# Patient Record
Sex: Female | Born: 1945 | Race: White | Hispanic: No | Smoking: Never smoker
Health system: Southern US, Community
[De-identification: ages and names within clinical notes are randomized; demographics above are authoritative.]

## PROBLEM LIST (undated history)

## (undated) DIAGNOSIS — T7840XA Allergy, unspecified, initial encounter: Secondary | ICD-10-CM

## (undated) DIAGNOSIS — L109 Pemphigus, unspecified: Secondary | ICD-10-CM

## (undated) DIAGNOSIS — K5792 Diverticulitis of intestine, part unspecified, without perforation or abscess without bleeding: Secondary | ICD-10-CM

## (undated) DIAGNOSIS — M1990 Unspecified osteoarthritis, unspecified site: Secondary | ICD-10-CM

## (undated) DIAGNOSIS — E079 Disorder of thyroid, unspecified: Secondary | ICD-10-CM

## (undated) DIAGNOSIS — I82409 Acute embolism and thrombosis of unspecified deep veins of unspecified lower extremity: Secondary | ICD-10-CM

## (undated) DIAGNOSIS — I2699 Other pulmonary embolism without acute cor pulmonale: Secondary | ICD-10-CM

## (undated) DIAGNOSIS — M797 Fibromyalgia: Secondary | ICD-10-CM

## (undated) DIAGNOSIS — M109 Gout, unspecified: Secondary | ICD-10-CM

## (undated) DIAGNOSIS — M722 Plantar fascial fibromatosis: Secondary | ICD-10-CM

## (undated) DIAGNOSIS — D689 Coagulation defect, unspecified: Secondary | ICD-10-CM

## (undated) DIAGNOSIS — F419 Anxiety disorder, unspecified: Secondary | ICD-10-CM

## (undated) HISTORY — DX: Fibromyalgia: M79.7

## (undated) HISTORY — PX: OTHER SURGICAL HISTORY: SHX169

## (undated) HISTORY — PX: EYE SURGERY: SHX253

## (undated) HISTORY — DX: Allergy, unspecified, initial encounter: T78.40XA

## (undated) HISTORY — DX: Coagulation defect, unspecified: D68.9

## (undated) HISTORY — PX: ABDOMINAL HYSTERECTOMY: SHX81

## (undated) HISTORY — DX: Gout, unspecified: M10.9

## (undated) HISTORY — DX: Plantar fascial fibromatosis: M72.2

## (undated) HISTORY — PX: KNEE ARTHROSCOPY WITH LATERAL MENISECTOMY: SHX6193

## (undated) HISTORY — DX: Unspecified osteoarthritis, unspecified site: M19.90

## (undated) HISTORY — DX: Pemphigus, unspecified: L10.9

## (undated) HISTORY — PX: CERVICAL FUSION: SHX112

## (undated) HISTORY — DX: Anxiety disorder, unspecified: F41.9

---

## 1997-09-18 ENCOUNTER — Ambulatory Visit (HOSPITAL_COMMUNITY): Admission: RE | Admit: 1997-09-18 | Discharge: 1997-09-18 | Payer: Self-pay | Admitting: Obstetrics and Gynecology

## 1998-07-27 ENCOUNTER — Other Ambulatory Visit: Admission: RE | Admit: 1998-07-27 | Discharge: 1998-07-27 | Payer: Self-pay | Admitting: Obstetrics and Gynecology

## 1999-09-21 ENCOUNTER — Other Ambulatory Visit: Admission: RE | Admit: 1999-09-21 | Discharge: 1999-09-21 | Payer: Self-pay | Admitting: *Deleted

## 2002-05-09 ENCOUNTER — Emergency Department (HOSPITAL_COMMUNITY): Admission: EM | Admit: 2002-05-09 | Discharge: 2002-05-09 | Payer: Self-pay | Admitting: Emergency Medicine

## 2002-06-06 ENCOUNTER — Ambulatory Visit (HOSPITAL_COMMUNITY): Admission: RE | Admit: 2002-06-06 | Discharge: 2002-06-06 | Payer: Self-pay | Admitting: Neurology

## 2002-06-06 ENCOUNTER — Encounter: Payer: Self-pay | Admitting: Neurology

## 2002-08-22 ENCOUNTER — Encounter: Payer: Self-pay | Admitting: Neurosurgery

## 2002-08-26 ENCOUNTER — Encounter: Payer: Self-pay | Admitting: Neurosurgery

## 2002-08-26 ENCOUNTER — Inpatient Hospital Stay (HOSPITAL_COMMUNITY): Admission: RE | Admit: 2002-08-26 | Discharge: 2002-08-28 | Payer: Self-pay | Admitting: Neurosurgery

## 2005-08-22 ENCOUNTER — Encounter: Admission: RE | Admit: 2005-08-22 | Discharge: 2005-08-22 | Payer: Self-pay | Admitting: Family Medicine

## 2006-05-26 ENCOUNTER — Encounter: Admission: RE | Admit: 2006-05-26 | Discharge: 2006-05-26 | Payer: Self-pay | Admitting: Family Medicine

## 2006-06-19 ENCOUNTER — Ambulatory Visit: Payer: Self-pay | Admitting: Cardiology

## 2006-06-22 ENCOUNTER — Ambulatory Visit (HOSPITAL_COMMUNITY): Admission: RE | Admit: 2006-06-22 | Discharge: 2006-06-22 | Payer: Self-pay | Admitting: Cardiology

## 2006-06-22 ENCOUNTER — Ambulatory Visit: Payer: Self-pay | Admitting: Cardiovascular Disease

## 2006-06-29 ENCOUNTER — Ambulatory Visit: Payer: Self-pay | Admitting: Gastroenterology

## 2006-11-01 ENCOUNTER — Ambulatory Visit: Payer: Self-pay | Admitting: Internal Medicine

## 2006-11-01 LAB — CONVERTED CEMR LAB: Albumin: 3.7 g/dL (ref 3.5–5.2)

## 2006-11-08 ENCOUNTER — Encounter: Payer: Self-pay | Admitting: Internal Medicine

## 2006-11-08 ENCOUNTER — Ambulatory Visit: Payer: Self-pay | Admitting: Internal Medicine

## 2007-03-21 DIAGNOSIS — M479 Spondylosis, unspecified: Secondary | ICD-10-CM | POA: Insufficient documentation

## 2007-03-21 DIAGNOSIS — K449 Diaphragmatic hernia without obstruction or gangrene: Secondary | ICD-10-CM | POA: Insufficient documentation

## 2007-03-21 DIAGNOSIS — K219 Gastro-esophageal reflux disease without esophagitis: Secondary | ICD-10-CM | POA: Insufficient documentation

## 2007-03-21 DIAGNOSIS — M5030 Other cervical disc degeneration, unspecified cervical region: Secondary | ICD-10-CM | POA: Insufficient documentation

## 2007-03-21 DIAGNOSIS — M797 Fibromyalgia: Secondary | ICD-10-CM | POA: Insufficient documentation

## 2007-03-21 DIAGNOSIS — K319 Disease of stomach and duodenum, unspecified: Secondary | ICD-10-CM | POA: Insufficient documentation

## 2007-03-21 DIAGNOSIS — J329 Chronic sinusitis, unspecified: Secondary | ICD-10-CM | POA: Insufficient documentation

## 2007-03-21 DIAGNOSIS — M129 Arthropathy, unspecified: Secondary | ICD-10-CM | POA: Insufficient documentation

## 2007-03-21 DIAGNOSIS — K589 Irritable bowel syndrome without diarrhea: Secondary | ICD-10-CM | POA: Insufficient documentation

## 2010-01-20 ENCOUNTER — Telehealth: Payer: Self-pay | Admitting: Internal Medicine

## 2010-01-21 ENCOUNTER — Ambulatory Visit: Admit: 2010-01-21 | Payer: Self-pay | Admitting: Internal Medicine

## 2010-02-07 ENCOUNTER — Telehealth: Payer: Self-pay | Admitting: Internal Medicine

## 2010-02-07 ENCOUNTER — Encounter (INDEPENDENT_AMBULATORY_CARE_PROVIDER_SITE_OTHER): Payer: Self-pay | Admitting: *Deleted

## 2010-02-17 ENCOUNTER — Ambulatory Visit: Payer: Self-pay | Admitting: Internal Medicine

## 2010-02-17 NOTE — Progress Notes (Signed)
Summary: Severe Regurgitation   Phone Note Call from Patient Call back at Home Phone [phone]   Call For: Dr Leone Payor Reason for Call: Talk to Nurse Summary of Call: Is regurgitating every single time she eats. Thinks she sees even small pieces of skin in her vomit. Would like to be seen asap. Initial call taken by: Leanor Kail [facility],  January 20, 2010 2:17 PM  Follow-up for Phone Call        Started Monday night with  severe reflux within an hr. of eating.Dexilant isn't helping.Given appt. for tomorrow with N.P. Follow-up by: Teryl Lucy RN,  January 20, 2010 2:45 PM

## 2010-02-17 NOTE — Letter (Signed)
Summary: New Patient letter  [facility]  [address]   [place], [place] [address]   Phone: [phone]  Fax: [phone]       02/07/2010 MRN: [id number]  Sherry Levine [address] [place], [place]  [address]  Dear Ms. Chestine Spore,  Welcome to the Gastroenterology Division at [facility].    You are scheduled to see Dr.  Leone Payor on 03/21/2010 at 2:45 on the 3rd floor at [facility], [address]  We ask that you try to arrive at our office 15 minutes prior to your appointment time to allow for check-in.  We would like you to complete the enclosed self-administered evaluation form prior to your visit and bring it with you on the day of your appointment.  We will review it with you.  Also, please bring a complete list of all your medications or, if you prefer, bring the medication bottles and we will list them.  Please bring your insurance card so that we may make a copy of it.  If your insurance requires a referral to see a specialist, please bring your referral form from your primary care physician.  Co-payments are due at the time of your visit and may be paid by cash, check or credit card.     Your office visit will consist of a consult with your physician (includes a physical exam), any laboratory testing he/she may order, scheduling of any necessary diagnostic testing (e.g. x-ray, ultrasound, CT-scan), and scheduling of a procedure (e.g. Endoscopy, Colonoscopy) if required.  Please allow enough time on your schedule to allow for any/all of these possibilities.    If you cannot keep your appointment, please call [phone] to cancel or reschedule prior to your appointment date.  This allows [place] the opportunity to schedule an appointment for another patient in need of care.  If you do not cancel or reschedule by 5 p.m. the business day prior to your appointment date, you will be charged a $50.00 late cancellation/no-show fee.    Thank you for choosing [facility] for your medical needs.  We appreciate the opportunity to care for you.  Please visit [place] at our website  to learn more about our practice.                     Sincerely,                                                             The Gastroenterology Division

## 2010-02-17 NOTE — Progress Notes (Signed)
Summary: triage   Phone Note Call from Patient Call back at Home Phone [phone]   Caller: Patient Call For: Dr. Leone Payor Reason for Call: Talk to Nurse Summary of Call: Pt is having reflux and she is coughing up "yellow mucus", wants to know if there is anything she can take other than dexillant Initial call taken by: [place] Johnson,  February 07, 2010 10:37 AM  Follow-up for Phone Call        patient is advised to continue dexilant.  She has not been seen here in over 3 years.  She is scheduled for a NP3 for 02/17/10 Follow-up by: Darcey Nora RN, CGRN,  February 07, 2010 3:25 PM

## 2010-03-21 ENCOUNTER — Ambulatory Visit: Payer: Self-pay | Admitting: Internal Medicine

## 2010-05-31 NOTE — Assessment & Plan Note (Signed)
[facility]                         GASTROENTEROLOGY OFFICE NOTE   ANNAMAY, LAYMON                        MRN:          [id number]  DATE:11/01/2006                            DOB:          1945-07-11    REQUESTING PHYSICIAN:  Lindaann Pascal, PA-C, [facility].   REASON FOR CONSULTATION:  Acid reflux, bloating, constipation.   ASSESSMENT:  A 65 year old white woman previously followed years ago by  Dr. Victorino Dike.  Her current problems are:  1. Gastroesophageal reflux disease with nocturnal or early morning      regurgitation several times a week.  Previous history of peptic      stricture but not a problem now.  She is on chronic NSAIDs,      (Arthrotec), from which she is weaning.  2. Irritable bowel syndrome with bloating and gas and constipation,      problems which are chronic, taking Dulcolax 5 tablets every other      day to move her bowels.  MiraLax did not seem to help.  3. She is due for colon cancer screening, having had her last      colonoscopy, if any, years ago.   RECOMMENDATIONS/PLAN:  1. Schedule EGD to investigate chronic reflux and regurgitation.  2. Consider metoclopramide at bedtime, depending what is seen.  She      has a history of a hiatal hernia.  She could need an upper GI      series.  3. Schedule screening colonoscopy.  4. Treat irritable bowel syndrome with possible probiotics, possible      Amitiza, possible antibiotics, possible small bowel bacterial      overgrowth.  Will hold this and metoclopramide therapy until we      perform her studies.  5. The patient also has mild elevation of transaminases and ultrasound      demonstrating no problems with her gallbladder (307).  It did not      demonstrate fatty liver, although with her situation, I think that      is possible.  It also could be due to certain medications.  I do      not think there is any serious liver disease, based upon what  I      see. There are certainly no stigmata of chronic liver disease.  She      is not a drinker.  Further plans pending.  Consider celiac testing,      given the irritable bowel and the abnormal LFTs.   HISTORY:  See what I have written above as well as my office history  form.   PAST MEDICAL HISTORY/PROBLEMS:  1. Fibromyalgia.  2. Degenerative disk disease in the spine.  3. Cervical fusion and titanium plate, 1324.  She has had some      dysphagia since that time, although it is mainly pills, no food.  4. Prior hemorrhoidectomy.  5. Prior hysterectomy.  6. Allergies and sinus problems.  7. Overweight.  8. Prior menopause.  9. Chest pain, evaluated by Dr. Antoine Poche with negative cardiac CT.  10.Tertiary contractions consistent  with spasm in the esophagus on      barium swallow in 1997.  She also had a hiatal hernia and lodging      of a tablet at the aortic arch at that time.  11.Calcium score of 0 on cardiac CT, June 22, 2006.  12.Small cyst in the liver noted on abdominal ultrasound, 06/08 (she      has had two ultrasounds, one in [place] and one here in [place]).  13.Allergies to CELEBREX, CODEINE, AND PENICILLIN.  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   MEDICATIONS:  See medical history form, but these include Arthritec,  tramadol, APAP, Cymbalta, Flexeril, trazodone, Nexium, fexofenadine,  Flonase, BiDil, Dulcolax 5 every other day.   FAMILY HISTORY/SOCIAL HISTORY:  She had colon cancer in an uncle, colon  polyps in an aunt.  Parents had heart disease.  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  Close to  retired.  Two sons.  Lives with her husband.  He is already retired.   REVIEW OF SYSTEMS:  See my medical history form for full details.   PHYSICAL EXAMINATION:  Height 5 feet 3.  Weight 168 pounds.  Blood  pressure 112/78, pulse 60.  HEENT:  Eyes are anicteric.  Normal mouth, lips, and pharynx.  NECK:  Supple.  No thyromegaly  appreciated.  CHEST:  Clear.  HEART:  S1 and S2.  No murmurs, rubs or gallops.  ABDOMEN:  Soft and nontender without organomegaly or mass.  SKIN:  No acute rash.  There is no stigmata of chronic liver disease.  PSYCH:  She is alert and oriented x3.  EXTREMITIES:  Free of edema.  LYMPHATIC:  No neck or supraclavicular nodes.   I have reviewed the office notes.  The laboratory studies, x-ray studies  provided by Lindaann Pascal, PA-C.  She had a normal CBC in April.  I have  reviewed Dr. Jenene Slicker notes.   NOTE:  She has had a vitamin D deficiency adequately treated by a  rheumatologist at [facility].  Her vitamin D level is now up to 50, and she  said she felt better at that.  Her AST was normal at 40.  Her ALT was  51.  GGT 82, cholesterol 212 in April.     Iva Boop, MD,FACG  Electronically Signed   CEG/MedQ  DD: 11/01/2006  DT: 11/02/2006  Job #: [id number]   cc:   Lindaann Pascal, PA-C

## 2010-05-31 NOTE — Assessment & Plan Note (Signed)
[facility]                            CARDIOLOGY OFFICE NOTE   SHARLISA, HOLLIFIELD                        MRN:          [id number]  DATE:06/19/2006                            DOB:          Jun 22, 1945    PRIMARY:  Lindaann Pascal, PA, [facility].   REASON FOR CONSULTATION:  Evaluate patient with chest discomfort and  significant cardiovascular risk factors.   HISTORY OF PRESENT ILLNESS:  The patient is a 65 year old without prior  cardiac history.  She has a very strong family history of coronary  disease.  She has had chest discomfort; however, she also has  fibromyalgia and has difficulty sorting this out.  The discomfort occurs  sporadically.  It happens at rest and with activity.  It has been slowly  progressive over time.  She has some diffuse discomfort that may be  around her back and around her chest.  Again, she cannot differentiate  this from fibromyalgia versus other sources.  She does get short of  breath with activity, but she also reports that she has not been active  because of her fibromyalgia.  She does not have any resting shortness of  breath, does not have any PND or orthopnea.  When she gets this  discomfort, it is moderate to severe in intensity.  It may last minutes  to hours.  It is not associated with nausea, vomiting, or diaphoresis.  She has not had any palpitations, no presyncope or syncope.  She does  have fatigue.  This has also been slowly progressive.  The patient has  not had any cardiovascular testing previously.   PAST MEDICAL HISTORY:  1. Sleep apnea, mild (she currently is not wearing CPAP).  2. Fibromyalgia.   PAST SURGICAL HISTORY:  1. Surgical fusion.  2. Knee arthroscopy.  3. Hysterectomy.  4. Hemorrhoidectomy.   ALLERGIES:  1. CELEBREX.  2. CODEINE.  3. PENICILLIN.   MEDICATIONS:  1. Cymbalta 120 mg daily.  2. Ultram 100 mg daily.  3. Allegra.  4. Nasacort.  5. Arthrotec.  6. Flexeril 10 mg daily.  7. Vivelle patch.   SOCIAL HISTORY:  The patient is an Environmental health practitioner at [employer].  She is married.  She has 2 children and 3 grandchildren.  She has never  smoked cigarettes and does not drink alcohol.   FAMILY HISTORY:  Contributory for a father having heart disease in his  85s and dying of myocardial infarction at age 60.  Her mother died of a  myocardial infarction at age 51.  She has no brothers and sisters.   REVIEW OF SYSTEMS:  As stated in the HPI.  Positive for reflux, hiatal  hernia, seasonal allergies, constipation, weight gain, negative for  other systems.   PHYSICAL EXAMINATION:  GENERAL:  The patient is in no distress with  blood pressure 130/90, heart rate 80 and regular, weight 163 pounds.  Body mass index 29.  HEENT:  Eyelids unremarkable, pupils equal, round, and reactive to  light.  Fundi within normal limits.  Oral mucosa unremarkable.  NECK:  No jugular venous  distention.  Wave form within normal limits.  Carotid upstroke brisk and symmetric, no bruits, thyromegaly.  LYMPHATICS:  No cervical, axillary, inguinal adenopathy.  LUNGS:  Clear to auscultation bilaterally.  BACK:  No costovertebral angle tenderness.  CHEST:  Unremarkable.  HEART:  PMI nondisplaced or sustained.  S1 and S1 within normal limits,  no S3, no S4.  No clicks, rubs, murmurs.  ABDOMEN:  Flat, positive bowel sounds, normal in frequency and pitch.  No bruits, rebound, guarding, no midline pulsatile mass, no  hepatomegaly, no splenomegaly.  SKIN:  No rashes.  EXTREMITIES:  2+ pulses right, no edema, no cyanosis, clubbing.  NEUROLOGIC:  Oriented to person, place, or time.  Cranial nerves 2-12  grossly intact.  Motor grossly intact.   EKG:  Sinus rhythm, rate 80, axis leftward, intervals within normal  limits, no congestive ST-T wave changes.   ASSESSMENT/PLAN:  1. Chest:  The patient's chest discomfort is somewhat atypical.  I      think the possibility of  obstructive coronary disease is moderate.      At this point, I think the most helpful test would be a cardiac CT      or calcium score.  This will allow [place] to identify potential plaque      burden, decide on further testing such as stress testing and also      the level of aggressiveness for risk reduction.  I have discussed      this with the patient.  2. Risk reduction.  Given her LDL of 131 and her family history, I do      think it is prudent to treat her with statin.  She is going to get      Lipitor 20 mg daily.  She understands the thought      process in this and this is outside the recommended guidelines.  3. Followup.  I will see her back based on the results of the above.     Rollene Rotunda, MD, [facility]  Electronically Signed    JH/MedQ  DD: 06/19/2006  DT: 06/19/2006  Job #: [id number]   cc:   Lindaann Pascal, PA

## 2010-06-03 NOTE — Op Note (Signed)
   NAME:  PANTERA, WINTERROWD                           ACCOUNT NO.:  [id number]   MEDICAL RECORD NO.:  [id number]                   PATIENT TYPE:  OUT   LOCATION:  MDC                                  FACILITY:  [facility]   PHYSICIAN:  Casimiro Needle L. Thad Ranger, M.D.           DATE OF BIRTH:  Jan 04, 1946   DATE OF PROCEDURE:  06/06/2002  DATE OF DISCHARGE:  06/06/2002                                 OPERATIVE REPORT   PROCEDURE:  Diagnostic lumbar puncture, unsuccessful.   OPERATOR:  Michael L. Reynolds, M.D.   DESCRIPTION OF PROCEDURE:  Informed consent was obtained and placed in the  chart after the procedure and its benefits and risks were explained to the  patient and she agreed to proceed.  The patient was placed in the right  lateral decubitus position and prepped and draped in the usual sterile  fashion.  Local anesthesia was achieved with 2 mL of lidocaine at the L4-5  level.  A few attempts were made to pass a 20-gauge spinal needle at the L4-  5 interspace, but those were unsuccessful.  The patient was reanesthetized  with 2 mL of lidocaine at the L3-4 interspace.  At this level the needle  advanced easily and was felt to be well-placed, but there was no return of  cerebrospinal fluid even with gentle suction.  The procedure was then  aborted and will be reattempted in radiology, and orders for fluid analysis  were written accordingly.                                               Michael L. Thad Ranger, M.D.    MLR/MEDQ  D:  06/06/2002  T:  06/07/2002  Job:  [id number]

## 2010-06-03 NOTE — Op Note (Signed)
NAME:  COTI, BURD                           ACCOUNT NO.:  [id number]   MEDICAL RECORD NO.:  [id number]                   PATIENT TYPE:  OIB   LOCATION:  3172                                 FACILITY:  [facility]   PHYSICIAN:  Hilda Lias, M.D.                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   PROCEDURE:  C5-6, 6-7 diskectomy decompressing the spinal cord, bilateral  foraminotomies, interbody fusion with allograft plate from C5 to C7,  microscope.   SURGEON:  Hilda Lias, M.D.   ASSISTANT:  Payton Doughty, M.D.   CLINICAL HISTORY:  The patient was admitted because of neck pain with  radiation to both upper extremities, right worse than the left. I have  followed  this lady for many years and now she is getting worse. X-ray shows  progressive stenosis of the C5-6, 6-7. The patient wanted to proceed with  the surgery and the risks were explained in the history and physical.   DESCRIPTION OF PROCEDURE:  The patient was taken to the operating room and  after intubation the left side of the neck  was prepped with Betadine. A  midline incision in the left side through the skin and platysma was carried  out.   There was a big osteophyte at the level of 5-6, 6-7 which was revealed by x-  ray. The x-ray showed that indeed we at the level of 5-6. The anterior  osteophyte was removed and we brought the microscope into the area.   Using the micro curet and the pituitary rongeurs, a total gross diskectomy  was accomplished at those 2 layers. From then  on we opened the posterior  ligament. Indeed there was quite a bit of narrowing after we removed the  posterior ligament. Decompression at both the C6 and C7 nerve roots was done  on the  right side and likewise on the left side.   At the level of 6-7 on the left we found quite a bit of necrosis with  calcified old herniated disk. Decompression of the spinal cord at both the 6  and 7 nerve root was accomplished. Then with the drill, we drilled the  endplate and a piece of allograft 7 mm high was inserted. This was followed  by the plate from C5 to C7. Radiograph of the C-spine showed good position  of the graft  and the plate.   From then on the area was irrigated. Hemostasis was accomplished with  bipolar. Then the wound was closed with Vicryl and Steri-Strips.  Hilda Lias, M.D.    EB/MEDQ  D:  08/26/2002  T:  08/26/2002  Job:  [id number]

## 2010-06-03 NOTE — H&P (Signed)
NAME:  Sherry Levine, Sherry Levine                           ACCOUNT NO.:  [id number]   MEDICAL RECORD NO.:  [id number]                   PATIENT TYPE:  OIB   LOCATION:  3012                                 FACILITY:  [facility]   PHYSICIAN:  Hilda Lias, M.D.                DATE OF BIRTH:  October 27, 1945   DATE OF ADMISSION:  08/26/2002  DATE OF DISCHARGE:                                HISTORY & PHYSICAL   HISTORY OF PRESENT ILLNESS:  Sherry Levine is a lady who was seen in my office  several months ago because of neck pain.  Many years ago, she was followed  by me and she has lumbar spondylosis at the level of 5-6.  She had  conservative treatment, but now she feels that the pain is getting worse.  The pain is not only localized to the neck but is going to the arm,  sometimes going to the right leg with a tingling sensation in both hands.  She was seen by Dr. Thad Ranger, a neurologist, who ruled out the possibility  of multiple sclerosis.  The patient had an MRI since last evaluation.  She  had been diagnosed with fibromyalgia and chronic fatigue syndrome but  although all her pain is getting better, nevertheless, the neck and right  arm pain is getting worse.   PAST MEDICAL HISTORY:  1. Hysterectomy.  2. Hemorrhoidectomy.  3. Knee surgery.   SHE IS ALLERGIC TO CODEINE AND CELEBREX.   SOCIAL HISTORY:  She does not smoke and does not drink.   FAMILY HISTORY:  Both parents died of heart disease.   REVIEW OF SYSTEMS:  Positive for neck pain, leg pain, blurry vision.   PHYSICAL EXAMINATION:  HEENT:  Head is normal.  NECK:  She is able to flex, but extension and rotation produces discomfort  going to the shoulder.  LUNGS:  Clear.  HEART:  Sounds normal.  EXTREMITIES:  Normal pulses.  Cranial nerves normal.  Strength normal except  that I can bend very easily both biceps and both wrist extensions.  Reflexes  symmetrical.  No Babinski's. Sensation normal.   On palpation, the thoracic spine shows  some tenderness in the midthoracic  area.  The cervical x-ray showed that she has spondylosis, mostly at the  level of 5-6, 6-7 with foraminal stenosis.  The MRI showed the same findings  without spondylosis or stenosis at those two levels.  The MRI of the  thoracic area showed a small, left-sided thoracic 6 herniated disk.   IMPRESSION:  1. 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  2. A small incidental thoracic 6-7 herniated disk.  3. History of fibromyalgia.    RECOMMENDATIONS:  The patient will proceed with surgery.  The procedure will  be a two-level cervical diskectomy of 5-6, 5-7 with a bone banking graft.  She knows all the risks of infection, CSF leak, worsening of pain,  paralysis, need for  additional surgery, failure of the graft, and  numbness  and paresthesias of the neck and face.                                                Hilda Lias, M.D.    EB/MEDQ  D:  08/26/2002  T:  08/26/2002  Job:  [id number]

## 2010-11-11 DIAGNOSIS — I722 Aneurysm of renal artery: Secondary | ICD-10-CM | POA: Insufficient documentation

## 2010-11-11 DIAGNOSIS — D6859 Other primary thrombophilia: Secondary | ICD-10-CM | POA: Insufficient documentation

## 2010-11-11 DIAGNOSIS — I824Y9 Acute embolism and thrombosis of unspecified deep veins of unspecified proximal lower extremity: Secondary | ICD-10-CM | POA: Insufficient documentation

## 2010-11-16 DIAGNOSIS — G8929 Other chronic pain: Secondary | ICD-10-CM | POA: Insufficient documentation

## 2010-11-16 DIAGNOSIS — D500 Iron deficiency anemia secondary to blood loss (chronic): Secondary | ICD-10-CM | POA: Insufficient documentation

## 2010-11-16 DIAGNOSIS — G4700 Insomnia, unspecified: Secondary | ICD-10-CM | POA: Insufficient documentation

## 2011-01-04 DIAGNOSIS — L280 Lichen simplex chronicus: Secondary | ICD-10-CM | POA: Insufficient documentation

## 2011-01-23 DIAGNOSIS — F411 Generalized anxiety disorder: Secondary | ICD-10-CM | POA: Insufficient documentation

## 2011-01-23 DIAGNOSIS — F419 Anxiety disorder, unspecified: Secondary | ICD-10-CM | POA: Insufficient documentation

## 2011-05-01 DIAGNOSIS — J309 Allergic rhinitis, unspecified: Secondary | ICD-10-CM | POA: Insufficient documentation

## 2011-05-08 DIAGNOSIS — G4733 Obstructive sleep apnea (adult) (pediatric): Secondary | ICD-10-CM | POA: Insufficient documentation

## 2011-11-28 ENCOUNTER — Encounter (HOSPITAL_COMMUNITY): Payer: Self-pay

## 2011-11-28 ENCOUNTER — Emergency Department (HOSPITAL_COMMUNITY)
Admission: EM | Admit: 2011-11-28 | Discharge: 2011-11-28 | Disposition: A | Discharge status: Home / Self Care | Payer: Medicare Other | Attending: Emergency Medicine | Admitting: Emergency Medicine

## 2011-11-28 DIAGNOSIS — S01501A Unspecified open wound of lip, initial encounter: Secondary | ICD-10-CM | POA: Insufficient documentation

## 2011-11-28 DIAGNOSIS — Y939 Activity, unspecified: Secondary | ICD-10-CM | POA: Insufficient documentation

## 2011-11-28 DIAGNOSIS — E079 Disorder of thyroid, unspecified: Secondary | ICD-10-CM | POA: Insufficient documentation

## 2011-11-28 DIAGNOSIS — S0185XA Open bite of other part of head, initial encounter: Secondary | ICD-10-CM

## 2011-11-28 DIAGNOSIS — Z79899 Other long term (current) drug therapy: Secondary | ICD-10-CM | POA: Insufficient documentation

## 2011-11-28 DIAGNOSIS — Y929 Unspecified place or not applicable: Secondary | ICD-10-CM | POA: Insufficient documentation

## 2011-11-28 DIAGNOSIS — I824Y9 Acute embolism and thrombosis of unspecified deep veins of unspecified proximal lower extremity: Secondary | ICD-10-CM | POA: Insufficient documentation

## 2011-11-28 DIAGNOSIS — I2699 Other pulmonary embolism without acute cor pulmonale: Secondary | ICD-10-CM | POA: Insufficient documentation

## 2011-11-28 DIAGNOSIS — W540XXA Bitten by dog, initial encounter: Secondary | ICD-10-CM | POA: Insufficient documentation

## 2011-11-28 HISTORY — DX: Acute embolism and thrombosis of unspecified deep veins of unspecified lower extremity: I82.409

## 2011-11-28 HISTORY — DX: Disorder of thyroid, unspecified: E07.9

## 2011-11-28 HISTORY — DX: Other pulmonary embolism without acute cor pulmonale: I26.99

## 2011-11-28 MED ORDER — TETANUS-DIPHTH-ACELL PERTUSSIS 5-2.5-18.5 LF-MCG/0.5 IM SUSP
0.5000 mL | Freq: Once | INTRAMUSCULAR | Status: AC
  Administered 2011-11-28: 0.5 mL via INTRAMUSCULAR
  Filled 2011-11-28: qty 0.5

## 2011-11-28 MED ORDER — OXYCODONE-ACETAMINOPHEN 5-325 MG PO TABS
1.0000 | ORAL_TABLET | Freq: Once | ORAL | Status: AC
  Administered 2011-11-28: 1 via ORAL
  Filled 2011-11-28: qty 1

## 2011-11-28 NOTE — ED Provider Notes (Signed)
History     CSN: [id number]  Arrival date & time 11/28/11  4098   First MD Initiated Contact with Patient 11/28/11 0359      Chief Complaint  Patient presents with  . Animal Bite    (Consider location/radiation/quality/duration/timing/severity/associated sxs/prior treatment) HPI  Sherry Levine is a 66 y.o. female who presents to the Emergency Department complaining of dog bite to her lower lip that occurred just prior to arrival. Patient's dog sleeps in bed with her and has a skin condition that causes him to scratch. She reached down to quiet him when he jumped and bit her on the face. His shots are up to date. She will need a tetanus update.  PCP  Dr. Margo Aye Past Medical History  Diagnosis Date  . Pulmonary embolism   . DVT (deep venous thrombosis)   . Thyroid disease     Past Surgical History  Procedure Date  . Abdominal hysterectomy   . Cervical fusion   . Knee arthroscopy with lateral menisectomy     right knee  . Carpel tunnel     right hand    History reviewed. No pertinent family history.  History  Substance Use Topics  . Smoking status: Never Smoker   . Smokeless tobacco: Not on file  . Alcohol Use: No    OB History    Grav Para Term Preterm Abortions TAB SAB Ect Mult Living                  Review of Systems  Constitutional: Negative for fever.       10 Systems reviewed and are negative for acute change except as noted in the HPI.  HENT: Negative for congestion.   Eyes: Negative for discharge and redness.  Respiratory: Negative for cough and shortness of breath.   Cardiovascular: Negative for chest pain.  Gastrointestinal: Negative for vomiting and abdominal pain.  Musculoskeletal: Negative for back pain.  Skin: Negative for rash.       Puncture wound to lower lip  Neurological: Negative for syncope, numbness and headaches.  Psychiatric/Behavioral:       No behavior change.    Allergies  Celebrex and Codeine  Home Medications   Current  Outpatient Rx  Name  Route  Sig  Dispense  Refill  . CETIRIZINE HCL 10 MG PO TABS   Oral   Take 10 mg by mouth daily.         Marland Kitchen DICLOFENAC-MISOPROSTOL 50-0.2 MG PO TBEC   Oral   Take 1 capsule by mouth.         . ESCITALOPRAM OXALATE 5 MG PO TABS   Oral   Take 5 mg by mouth daily.         Marland Kitchen HYDROCODONE-ACETAMINOPHEN 5-500 MG PO TABS   Oral   Take 1 tablet by mouth every 6 (six) hours as needed.         Marland Kitchen LEVOTHYROXINE SODIUM 50 MCG PO TABS   Oral   Take 50 mcg by mouth daily.         Marland Kitchen METHOCARBAMOL 500 MG PO TABS   Oral   Take 500 mg by mouth 4 (four) times daily.         . WARFARIN SODIUM 4 MG PO TABS   Oral   Take 4 mg by mouth daily. Alternating 4mg /6mg  daily           BP 153/89  Pulse 76  Temp 98.1 F (36.7 C) (Oral)  Resp  20  Ht 5\' 3"  (1.6 m)  Wt 180 lb (81.647 kg)  BMI 31.89 kg/m2  SpO2 100%  Physical Exam  Nursing note and vitals reviewed. Constitutional: She appears well-developed and well-nourished.       Awake, alert, nontoxic appearance.  HENT:  Head: Normocephalic.       Small puncture would to left lateral lower lip, no suture required. Bleeding controlled.   Eyes: Right eye exhibits no discharge. Left eye exhibits no discharge.  Neck: Neck supple.  Cardiovascular: Normal heart sounds.   Pulmonary/Chest: Effort normal and breath sounds normal. She exhibits no tenderness.  Abdominal: Soft. There is no tenderness. There is no rebound.  Musculoskeletal: She exhibits no tenderness.       Baseline ROM, no obvious new focal weakness.  Neurological:       Mental status and motor strength appears baseline for patient and situation.  Skin: No rash noted.  Psychiatric: She has a normal mood and affect.    ED Course  Procedures (including critical care time)     MDM  Patient with a dog bite to her face. Small puncture wound to lower lip. Tetanus updated. Given analgesic. Pt stable in ED with no significant deterioration in  condition.The patient appears reasonably screened and/or stabilized for discharge and I doubt any other medical condition or other [facility] requiring further screening, evaluation, or treatment in the ED at this time prior to discharge.  MDM Reviewed: nursing note and vitals           Nicoletta Dress. Colon Branch, MD 11/28/11 [phone]

## 2011-11-28 NOTE — ED Notes (Signed)
Bit by own dog on mouth, lower lip

## 2012-01-22 ENCOUNTER — Other Ambulatory Visit: Payer: Self-pay | Admitting: Family Medicine

## 2012-01-22 DIAGNOSIS — R079 Chest pain, unspecified: Secondary | ICD-10-CM

## 2012-01-23 ENCOUNTER — Ambulatory Visit (HOSPITAL_COMMUNITY)
Admission: RE | Admit: 2012-01-23 | Discharge: 2012-01-23 | Payer: Medicare Other | Source: Ambulatory Visit | Attending: Family Medicine | Admitting: Family Medicine

## 2012-01-23 ENCOUNTER — Ambulatory Visit (HOSPITAL_COMMUNITY)
Admission: RE | Admit: 2012-01-23 | Discharge: 2012-01-23 | Disposition: A | Discharge status: Home / Self Care | Payer: Medicare Other | Source: Ambulatory Visit | Attending: Family Medicine | Admitting: Family Medicine

## 2012-01-23 DIAGNOSIS — R079 Chest pain, unspecified: Secondary | ICD-10-CM

## 2012-01-23 DIAGNOSIS — R0602 Shortness of breath: Secondary | ICD-10-CM | POA: Insufficient documentation

## 2012-01-23 LAB — BUN: BUN: 11 mg/dL (ref 6–23)

## 2012-01-23 LAB — CREATININE, SERUM: GFR calc Af Amer: 86 mL/min — ABNORMAL LOW (ref >90)

## 2012-01-23 MED ORDER — IOHEXOL 350 MG/ML SOLN
100.0000 mL | Freq: Once | INTRAVENOUS | Status: AC | PRN
  Administered 2012-01-23: 100 mL via INTRAVENOUS

## 2012-01-23 MED ORDER — IOHEXOL 300 MG/ML  SOLN: 100.0000 mL | Freq: Once | INTRAMUSCULAR | Status: DC | PRN

## 2012-04-04 ENCOUNTER — Encounter: Payer: Self-pay | Admitting: Nurse Practitioner

## 2012-04-04 ENCOUNTER — Ambulatory Visit (INDEPENDENT_AMBULATORY_CARE_PROVIDER_SITE_OTHER): Payer: Medicare Other | Admitting: Nurse Practitioner

## 2012-04-04 VITALS — BP 102/66 | HR 96 | Temp 97.4°F | Ht 63.0 in | Wt 179.0 lb

## 2012-04-04 DIAGNOSIS — D6852 Prothrombin gene mutation: Secondary | ICD-10-CM

## 2012-04-04 DIAGNOSIS — I82402 Acute embolism and thrombosis of unspecified deep veins of left lower extremity: Secondary | ICD-10-CM

## 2012-04-04 DIAGNOSIS — R791 Abnormal coagulation profile: Secondary | ICD-10-CM

## 2012-04-04 DIAGNOSIS — I82409 Acute embolism and thrombosis of unspecified deep veins of unspecified lower extremity: Secondary | ICD-10-CM

## 2012-04-04 DIAGNOSIS — I2699 Other pulmonary embolism without acute cor pulmonale: Secondary | ICD-10-CM | POA: Insufficient documentation

## 2012-04-04 DIAGNOSIS — D6859 Other primary thrombophilia: Secondary | ICD-10-CM

## 2012-04-04 DIAGNOSIS — G4700 Insomnia, unspecified: Secondary | ICD-10-CM

## 2012-04-04 LAB — POCT HEMOGLOBIN: Hemoglobin: 11.8 g/dL — AB (ref 12.2–16.2)

## 2012-04-04 MED ORDER — ALPRAZOLAM 0.5 MG PO TABS: 0.5000 mg | ORAL_TABLET | Freq: Every evening | ORAL | Status: DC | PRN

## 2012-04-04 MED ORDER — WARFARIN SODIUM 4 MG PO TABS: 4.0000 mg | ORAL_TABLET | Freq: Every day | ORAL | Status: DC

## 2012-04-04 NOTE — Patient Instructions (Signed)

## 2012-04-04 NOTE — Progress Notes (Signed)
Subjective:    Patient ID: Sherry Levine, female    DOB: 01/10/1946, 67 y.o.   MRN: [id number]  HPIPatient here today to discuss Meds. Patient doing well. Has fibromyalgia and years age saw Dr Linna Darner and he gave her Xanax to sleep at night which helped. Recently got Lorazepam 1mg  to use for sleep. Sleeps ok but feels groggy the next day. The xanax helped her sleep at night but didn't give her groggy affect the next day.    Review of Systems  Constitutional: Negative.   HENT: Negative.   Eyes: Negative.   Respiratory: Negative.   Cardiovascular: Negative.   Gastrointestinal: Negative.   Endocrine: Negative.   Genitourinary: Negative.   Musculoskeletal:       Pain from Fibromyalgia  Neurological: Negative.   Psychiatric/Behavioral: Negative.    Allergies  Allergen Reactions  . Celebrex (Celecoxib) Hives  . Codeine Itching  . Lyrica (Pregabalin)   . Omnicef (Cefdinir)   . Penicillins   . Zithromax (Azithromycin)     Outpatient Encounter Prescriptions as of 04/04/2012  Medication Sig Dispense Refill  . cetirizine (ZYRTEC) 10 MG tablet Take 10 mg by mouth daily.      . Diclofenac-Misoprostol (ARTHROTEC) 50-0.2 MG TBEC Take 1 capsule by mouth.      . DULoxetine (CYMBALTA) 60 MG capsule Take 60 mg by mouth daily.      Marland Kitchen levothyroxine (SYNTHROID, LEVOTHROID) 50 MCG tablet Take 50 mcg by mouth daily.      Marland Kitchen LORazepam (ATIVAN) 1 MG tablet Take 1 mg by mouth every 8 (eight) hours.      Marland Kitchen warfarin (COUMADIN) 4 MG tablet Take 4 mg by mouth daily. Alternating 4mg /6mg  daily      . escitalopram (LEXAPRO) 5 MG tablet Take 5 mg by mouth daily.      . fluconazole (DIFLUCAN) 200 MG tablet       . HYDROcodone-acetaminophen (VICODIN) 5-500 MG per tablet Take 1 tablet by mouth every 6 (six) hours as needed.      . methocarbamol (ROBAXIN) 500 MG tablet Take 500 mg by mouth 4 (four) times daily.       No facility-administered encounter medications on file as of 04/04/2012.    Past Medical History   Diagnosis Date  . Pulmonary embolism   . DVT (deep venous thrombosis)   . Thyroid disease     Past Surgical History  Procedure Laterality Date  . Abdominal hysterectomy    . Cervical fusion    . Knee arthroscopy with lateral menisectomy      right knee  . Carpel tunnel      right hand    History   Social History  . Marital Status: Married    Spouse Name: N/A    Number of Children: N/A  . Years of Education: N/A   Occupational History  . Not on file.   Social History Main Topics  . Smoking status: Never Smoker   . Smokeless tobacco: Not on file  . Alcohol Use: No  . Drug Use: No  . Sexually Active:    Other Topics Concern  . Not on file   Social History Narrative  . No narrative on file          Objective:   Physical Exam  Vitals reviewed. Constitutional: She is oriented to person, place, and time. She appears well-developed and well-nourished.  Eyes: Pupils are equal, round, and reactive to light.  Neck: Normal range of motion. Neck supple.  Cardiovascular:  Normal rate, normal heart sounds and intact distal pulses.   Pulmonary/Chest: Effort normal and breath sounds normal.  Neurological: She is alert and oriented to person, place, and time. She has normal reflexes.  Skin: Skin is warm and dry.  Psychiatric: She has a normal mood and affect. Her behavior is normal. Judgment and thought content normal.   BP 102/66  Pulse 96  Temp(Src) 97.4 F (36.3 C) (Oral)  Ht 5\' 3"  (1.6 m)  Wt 179 lb (81.194 kg)  BMI 31.72 kg/m2  LMP 04/05/1982   Results for orders placed in visit on 04/04/12  POCT INR      Result Value Range   INR >7.5      Results for orders placed in visit on 04/04/12  POCT INR      Result Value Range   INR >7.5    POCT HEMOGLOBIN      Result Value Range   Hemoglobin 11.8 (*) 12.2 - 16.2 g/dL       Assessment & Plan:  Insomnia  Will rx Xanax 0.5mg  qhs  Bedtime ritual  No caffiene at least 2 hrs prior to bedtime

## 2012-04-08 ENCOUNTER — Ambulatory Visit (INDEPENDENT_AMBULATORY_CARE_PROVIDER_SITE_OTHER): Payer: Medicare Other | Admitting: Pharmacist

## 2012-04-08 ENCOUNTER — Other Ambulatory Visit: Payer: Self-pay | Admitting: Nurse Practitioner

## 2012-04-08 ENCOUNTER — Telehealth: Payer: Self-pay | Admitting: Nurse Practitioner

## 2012-04-08 ENCOUNTER — Encounter: Payer: Self-pay | Admitting: *Deleted

## 2012-04-08 DIAGNOSIS — I82409 Acute embolism and thrombosis of unspecified deep veins of unspecified lower extremity: Secondary | ICD-10-CM

## 2012-04-08 DIAGNOSIS — I82402 Acute embolism and thrombosis of unspecified deep veins of left lower extremity: Secondary | ICD-10-CM

## 2012-04-08 DIAGNOSIS — I2699 Other pulmonary embolism without acute cor pulmonale: Secondary | ICD-10-CM

## 2012-04-08 DIAGNOSIS — D6852 Prothrombin gene mutation: Secondary | ICD-10-CM

## 2012-04-08 DIAGNOSIS — D6859 Other primary thrombophilia: Secondary | ICD-10-CM

## 2012-04-08 MED ORDER — LORAZEPAM 1 MG PO TABS: 1.0000 mg | ORAL_TABLET | Freq: Every evening | ORAL | Status: DC | PRN

## 2012-04-08 NOTE — Telephone Encounter (Signed)
Xanax not working properly. Please call

## 2012-04-08 NOTE — Telephone Encounter (Signed)
Only getting about 2 1/2 hours of sleep on xanax. Wants to go back on Ativan 1mg  qhs. [facility]

## 2012-04-08 NOTE — Progress Notes (Signed)
Reviewed labs and notes from [facility] ER through [facility]

## 2012-04-09 ENCOUNTER — Telehealth: Payer: Self-pay | Admitting: Nurse Practitioner

## 2012-04-09 NOTE — Telephone Encounter (Signed)
Please advise 

## 2012-04-09 NOTE — Telephone Encounter (Signed)
Needs to discuss medication

## 2012-04-09 NOTE — Telephone Encounter (Signed)
Patient told cannot be on Lorazepam and xanax. Cannot get both. Has to wait until Xanax runs out then will switch to ATIVAN.

## 2012-04-16 ENCOUNTER — Ambulatory Visit (INDEPENDENT_AMBULATORY_CARE_PROVIDER_SITE_OTHER): Payer: Medicare Other | Admitting: Pharmacist

## 2012-04-16 DIAGNOSIS — I2699 Other pulmonary embolism without acute cor pulmonale: Secondary | ICD-10-CM

## 2012-04-16 DIAGNOSIS — D6859 Other primary thrombophilia: Secondary | ICD-10-CM

## 2012-04-16 DIAGNOSIS — I82402 Acute embolism and thrombosis of unspecified deep veins of left lower extremity: Secondary | ICD-10-CM

## 2012-04-16 DIAGNOSIS — I82409 Acute embolism and thrombosis of unspecified deep veins of unspecified lower extremity: Secondary | ICD-10-CM

## 2012-04-16 DIAGNOSIS — D6852 Prothrombin gene mutation: Secondary | ICD-10-CM

## 2012-04-16 NOTE — Patient Instructions (Signed)
Anticoagulation Dose Instructions as of 04/16/2012     Glynis Smiles Tue Wed Thu Fri Sat   New Dose 2 mg 2 mg 2 mg 2 mg 2 mg 2 mg 2 mg    Description       Hold for 2 days, then start 2mg  ( = 1/2 tablet) every day.

## 2012-04-18 ENCOUNTER — Telehealth: Payer: Self-pay | Admitting: Pharmacist

## 2012-04-18 NOTE — Telephone Encounter (Signed)
Patient's INR has been very elevated lately and we have not been able to pin point a reason.  Today patient calls to report that she was started on Linzess and fluconazole 03/27/2012 by gastroenterologist due to yeast infection. She has neglected to mention this in past anticoag visits.  Fluconazole if known to increase INR.  I advised patient to continue with plan to hold warfarin and decrease dose as discussed on Tuesday 04/16/12. She denies any new bleeding or bruising.   Going forward may need to further adjust warfarin when she finished course of fluconazole.

## 2012-04-22 ENCOUNTER — Telehealth: Payer: Self-pay | Admitting: Family Medicine

## 2012-04-22 NOTE — Telephone Encounter (Signed)
appt given for 4/9 with pharm for protime.

## 2012-04-24 ENCOUNTER — Ambulatory Visit (INDEPENDENT_AMBULATORY_CARE_PROVIDER_SITE_OTHER): Payer: Medicare Other | Admitting: Pharmacist

## 2012-04-24 DIAGNOSIS — I2699 Other pulmonary embolism without acute cor pulmonale: Secondary | ICD-10-CM

## 2012-04-24 DIAGNOSIS — D6852 Prothrombin gene mutation: Secondary | ICD-10-CM

## 2012-04-24 DIAGNOSIS — I82409 Acute embolism and thrombosis of unspecified deep veins of unspecified lower extremity: Secondary | ICD-10-CM

## 2012-04-24 DIAGNOSIS — I82402 Acute embolism and thrombosis of unspecified deep veins of left lower extremity: Secondary | ICD-10-CM

## 2012-04-24 DIAGNOSIS — D6859 Other primary thrombophilia: Secondary | ICD-10-CM

## 2012-04-24 LAB — POCT INR: INR: 3.3

## 2012-04-26 DIAGNOSIS — K911 Postgastric surgery syndromes: Secondary | ICD-10-CM | POA: Insufficient documentation

## 2012-05-02 ENCOUNTER — Telehealth: Payer: Self-pay | Admitting: Nurse Practitioner

## 2012-05-02 ENCOUNTER — Telehealth: Payer: Self-pay | Admitting: Pharmacist

## 2012-05-02 NOTE — Telephone Encounter (Signed)
No open appointments today.   Called patient and told her to come anytime she can.

## 2012-05-02 NOTE — Telephone Encounter (Signed)
Pt called - told to come in Monday 05/06/12 at 8am  Henrene Pastor, PharmD, CPP

## 2012-05-09 ENCOUNTER — Ambulatory Visit (INDEPENDENT_AMBULATORY_CARE_PROVIDER_SITE_OTHER): Payer: Medicare Other | Admitting: Pharmacist

## 2012-05-09 DIAGNOSIS — D6859 Other primary thrombophilia: Secondary | ICD-10-CM

## 2012-05-09 DIAGNOSIS — I82402 Acute embolism and thrombosis of unspecified deep veins of left lower extremity: Secondary | ICD-10-CM

## 2012-05-09 DIAGNOSIS — D6852 Prothrombin gene mutation: Secondary | ICD-10-CM

## 2012-05-09 DIAGNOSIS — I2699 Other pulmonary embolism without acute cor pulmonale: Secondary | ICD-10-CM

## 2012-05-09 DIAGNOSIS — I82409 Acute embolism and thrombosis of unspecified deep veins of unspecified lower extremity: Secondary | ICD-10-CM

## 2012-05-09 LAB — POCT INR: INR: 2.9

## 2012-05-09 NOTE — Patient Instructions (Addendum)
Anticoagulation Dose Instructions as of 05/09/2012     Sherry Levine Tue Wed Thu Fri Sat   New Dose 2 mg 4 mg 2 mg 2 mg 2 mg 4 mg 2 mg    Description        Start 4mg  on mondays and fridays, 2mg  all other days       INR was 2.9 today

## 2012-05-27 ENCOUNTER — Other Ambulatory Visit: Payer: Self-pay | Admitting: *Deleted

## 2012-05-27 MED ORDER — CETIRIZINE HCL 10 MG PO TABS: 10.0000 mg | ORAL_TABLET | Freq: Every day | ORAL | Status: DC

## 2012-05-30 NOTE — Telephone Encounter (Signed)
See documentation from 04/09/12 per Bennie Pierini

## 2012-06-12 ENCOUNTER — Other Ambulatory Visit: Payer: Self-pay | Admitting: *Deleted

## 2012-06-12 MED ORDER — LORAZEPAM 1 MG PO TABS: 1.0000 mg | ORAL_TABLET | Freq: Every evening | ORAL | Status: DC | PRN

## 2012-06-12 NOTE — Telephone Encounter (Signed)
Patient last seen in office for chronic follow up on 01-22-12. Has had several visits since for protimes. Rx last filled on 05-18-12 for #30. If approved please have nurse phone in to pharmacy

## 2012-06-12 NOTE — Telephone Encounter (Signed)
Please call in rx for ativan 

## 2012-06-12 NOTE — Telephone Encounter (Signed)
rx CALLED TO VM.

## 2012-06-26 ENCOUNTER — Ambulatory Visit (INDEPENDENT_AMBULATORY_CARE_PROVIDER_SITE_OTHER): Payer: Medicare Other | Admitting: Pharmacist

## 2012-06-26 DIAGNOSIS — E162 Hypoglycemia, unspecified: Secondary | ICD-10-CM

## 2012-06-26 DIAGNOSIS — D6852 Prothrombin gene mutation: Secondary | ICD-10-CM

## 2012-06-26 DIAGNOSIS — I82409 Acute embolism and thrombosis of unspecified deep veins of unspecified lower extremity: Secondary | ICD-10-CM

## 2012-06-26 DIAGNOSIS — I82402 Acute embolism and thrombosis of unspecified deep veins of left lower extremity: Secondary | ICD-10-CM

## 2012-06-26 DIAGNOSIS — I2699 Other pulmonary embolism without acute cor pulmonale: Secondary | ICD-10-CM

## 2012-06-26 DIAGNOSIS — D6859 Other primary thrombophilia: Secondary | ICD-10-CM

## 2012-06-26 MED ORDER — ONETOUCH DELICA LANCETS 33G MISC: 1.0000 | Freq: Every day | Status: DC

## 2012-06-26 MED ORDER — GLUCOSE BLOOD VI STRP: ORAL_STRIP | Status: DC

## 2012-06-26 NOTE — Patient Instructions (Addendum)
Anticoagulation Dose Instructions as of 06/26/2012     Sherry Levine Tue Wed Thu Fri Sat   New Dose 4 mg 2 mg 4 mg 2 mg 4 mg 2 mg 4 mg    Description       1 and 1/2 tablets for 2 days [= 6mg ] then start 1/2 tablet on  Mondays, Wednesdays and Fridays and 1 tablet all other days       INR was 1.3 today

## 2012-06-26 NOTE — Progress Notes (Signed)
Patient has a history of hypogycemia and has been using One Touch Verio to check BG, but she has been having trouble with meter.  She brings in meter today for instruction.  Reset glucometer and reviewed how to check BG.   Rx written and given to patient for test strips and lancet.

## 2012-07-08 ENCOUNTER — Encounter: Payer: Self-pay | Admitting: Family Medicine

## 2012-07-08 ENCOUNTER — Ambulatory Visit (INDEPENDENT_AMBULATORY_CARE_PROVIDER_SITE_OTHER): Payer: Medicare Other | Admitting: Family Medicine

## 2012-07-08 ENCOUNTER — Telehealth: Payer: Self-pay | Admitting: Nurse Practitioner

## 2012-07-08 VITALS — BP 96/68 | HR 79 | Temp 99.4°F | Ht 63.0 in | Wt 177.6 lb

## 2012-07-08 DIAGNOSIS — D6859 Other primary thrombophilia: Secondary | ICD-10-CM

## 2012-07-08 DIAGNOSIS — I82402 Acute embolism and thrombosis of unspecified deep veins of left lower extremity: Secondary | ICD-10-CM

## 2012-07-08 DIAGNOSIS — I2699 Other pulmonary embolism without acute cor pulmonale: Secondary | ICD-10-CM

## 2012-07-08 DIAGNOSIS — D6852 Prothrombin gene mutation: Secondary | ICD-10-CM

## 2012-07-08 DIAGNOSIS — J029 Acute pharyngitis, unspecified: Secondary | ICD-10-CM

## 2012-07-08 DIAGNOSIS — I82409 Acute embolism and thrombosis of unspecified deep veins of unspecified lower extremity: Secondary | ICD-10-CM

## 2012-07-08 LAB — POCT RAPID STREP A (OFFICE): Rapid Strep A Screen: NEGATIVE

## 2012-07-08 LAB — POCT INR: INR: 1.4

## 2012-07-08 MED ORDER — TRIAMCINOLONE ACETONIDE 40 MG/ML IJ SUSP
60.0000 mg | Freq: Once | INTRAMUSCULAR | Status: AC
  Administered 2012-07-08: 60 mg via INTRAMUSCULAR

## 2012-07-08 MED ORDER — DOXYCYCLINE HYCLATE 100 MG PO TABS: 100.0000 mg | ORAL_TABLET | Freq: Two times a day (BID) | ORAL | Status: DC

## 2012-07-08 NOTE — Telephone Encounter (Signed)
appt made

## 2012-07-08 NOTE — Progress Notes (Signed)
  Subjective:    Patient ID: Sherry Levine, female    DOB: 08-27-1945, 67 y.o.   MRN: [id number]  HPI This 67 y.o. female presents for evaluation of URI sx's.  Patient states she has had a lot of sinus pressure and congestion.  She has had these sx's for over a week.  She is having some mucopurulent sinus drainage and sore throat.  She is having a headache and is experiencing some photophobia.  She states she was going to go the Emergency Room.  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   Review of Systems Complains of sinus pressure, sore throat, and headache.No chest pain, SOB, dizziness, vision change, N/V, diarrhea, constipation, dysuria, urinary urgency or frequency, myalgias, arthralgias or rash.     Objective:   Physical Exam Vital signs noted  Well developed well nourished female.  HEENT - Head atraumatic Normocephalic                Eyes - PERRLA, Conjuctiva - clear Sclera- Clear EOMI                Ears - EAC's Wnl TM's Wnl Gross Hearing WNL                Nose - Nares patent                 Throat - oropharanx Normal                Face - TTP bilateral maxillary sinuses  Respiratory - Lungs CTA bilateral Cardiac - RRR S1 and S2 without murmur GI - Abdomen soft Nontender and bowel sounds active x 4 Extremities - No edema. Neuro - Grossly intact.      Assessment & Plan:  Sore throat - Plan: Rapid Strep A, triamcinolone acetonide (KENALOG-40) injection 60 mg, doxycycline (VIBRA-TABS) 100 MG tablet.  Strep test is negative.  Discussed with patient to use tylenol for pain and to continue arthrotec and use warm salt water gargles prn.  DVT (deep venous thrombosis), unspecified laterality - Plan: CANCELED: Protime-INR results 1.4 and she is doing warfarin 1mg  sat, sun, tues, thurs, instructed to change to 1mg  MWF, and sat,sunday, and 0.5mg  Tuesday and Thursday.  Repeat PTINR in 2weeks.  Prothrombin mutation - Plan: POCT  INR  DVT (deep venous thrombosis), left - Plan: POCT INR  Acute pulmonary embolism - Plan: POCT INR

## 2012-07-08 NOTE — Patient Instructions (Signed)

## 2012-07-16 ENCOUNTER — Other Ambulatory Visit: Payer: Self-pay | Admitting: *Deleted

## 2012-07-16 MED ORDER — LORAZEPAM 1 MG PO TABS: 1.0000 mg | ORAL_TABLET | Freq: Every evening | ORAL | Status: DC | PRN

## 2012-07-16 NOTE — Telephone Encounter (Signed)
Please feel loraxepam rx with 2 refills

## 2012-07-16 NOTE — Telephone Encounter (Signed)
LAT RF 06/14/12. CALL IN [facility] [phone]. LAST OV 07/08/12 FOR SORE THROAT.

## 2012-07-17 NOTE — Telephone Encounter (Signed)
Rx done. 

## 2012-07-31 ENCOUNTER — Other Ambulatory Visit: Payer: Self-pay

## 2012-07-31 MED ORDER — DULOXETINE HCL 60 MG PO CPEP: 60.0000 mg | ORAL_CAPSULE | Freq: Every day | ORAL | Status: DC

## 2012-07-31 MED ORDER — LEVOTHYROXINE SODIUM 50 MCG PO TABS: 50.0000 ug | ORAL_TABLET | Freq: Every day | ORAL | Status: DC

## 2012-08-07 ENCOUNTER — Telehealth: Payer: Self-pay | Admitting: Pharmacist

## 2012-08-07 NOTE — Telephone Encounter (Signed)
Called to remind patient protime is due. appt made for tomorrow 7/24 at 2pm

## 2012-08-08 ENCOUNTER — Telehealth: Payer: Self-pay | Admitting: Nurse Practitioner

## 2012-08-08 NOTE — Telephone Encounter (Signed)
Called patient with appt for Wednesday, July 30th at [facility]

## 2012-08-15 ENCOUNTER — Encounter: Payer: Self-pay | Admitting: Pharmacist

## 2012-08-21 ENCOUNTER — Ambulatory Visit (INDEPENDENT_AMBULATORY_CARE_PROVIDER_SITE_OTHER): Payer: Medicare Other | Admitting: Pharmacist

## 2012-08-21 DIAGNOSIS — D6852 Prothrombin gene mutation: Secondary | ICD-10-CM

## 2012-08-21 DIAGNOSIS — I2699 Other pulmonary embolism without acute cor pulmonale: Secondary | ICD-10-CM

## 2012-08-21 DIAGNOSIS — I82409 Acute embolism and thrombosis of unspecified deep veins of unspecified lower extremity: Secondary | ICD-10-CM

## 2012-08-21 DIAGNOSIS — I82402 Acute embolism and thrombosis of unspecified deep veins of left lower extremity: Secondary | ICD-10-CM

## 2012-08-21 DIAGNOSIS — D6859 Other primary thrombophilia: Secondary | ICD-10-CM

## 2012-08-21 NOTE — Progress Notes (Signed)
Looking into coverage for [facility] home INR testing for patient to increase compliance with INR testing.

## 2012-08-21 NOTE — Patient Instructions (Signed)
Anticoagulation Dose Instructions as of 08/21/2012     Sherry Levine Tue Wed Thu Fri Sat   New Dose 4 mg 4 mg 4 mg 4 mg 4 mg 4 mg 4 mg    Description       Take 2 tablets today, then increase to 6mg  (= 1 and 1/2 tablets on Mondays and Fridays) and 4mg  (= 1 tablet) all other days.       INR was 1.4 today

## 2012-08-28 ENCOUNTER — Other Ambulatory Visit: Payer: Self-pay | Admitting: Nurse Practitioner

## 2012-08-30 ENCOUNTER — Other Ambulatory Visit: Payer: Self-pay

## 2012-08-30 MED ORDER — OMEPRAZOLE 20 MG PO CPDR: 20.0000 mg | DELAYED_RELEASE_CAPSULE | Freq: Every day | ORAL | Status: DC

## 2012-11-04 ENCOUNTER — Other Ambulatory Visit: Payer: Self-pay | Admitting: Family Medicine

## 2012-11-05 NOTE — Telephone Encounter (Signed)
Last seen 07/08/12 [facility] [place] 

## 2012-11-07 ENCOUNTER — Ambulatory Visit (INDEPENDENT_AMBULATORY_CARE_PROVIDER_SITE_OTHER): Payer: Self-pay | Admitting: Pharmacist

## 2012-11-07 DIAGNOSIS — D6859 Other primary thrombophilia: Secondary | ICD-10-CM

## 2012-11-07 DIAGNOSIS — I2699 Other pulmonary embolism without acute cor pulmonale: Secondary | ICD-10-CM

## 2012-11-07 DIAGNOSIS — D6852 Prothrombin gene mutation: Secondary | ICD-10-CM

## 2012-11-07 DIAGNOSIS — I82409 Acute embolism and thrombosis of unspecified deep veins of unspecified lower extremity: Secondary | ICD-10-CM

## 2012-11-07 DIAGNOSIS — I82402 Acute embolism and thrombosis of unspecified deep veins of left lower extremity: Secondary | ICD-10-CM

## 2012-11-07 NOTE — Progress Notes (Signed)
This anticoag encounter is to note that patient is now being seen by Dr Nechama Guard in [place] and they are managing her protime / warfarin therapy.  She indicated that since she lives in [place] this was more convenient for her.   No face to face visit today - no charge for protime or visit.

## 2013-01-14 ENCOUNTER — Other Ambulatory Visit: Payer: Self-pay | Admitting: Family Medicine

## 2013-01-16 HISTORY — PX: CHOLECYSTECTOMY: SHX55

## 2013-05-16 ENCOUNTER — Other Ambulatory Visit: Payer: Self-pay | Admitting: Family Medicine

## 2013-05-19 NOTE — Telephone Encounter (Signed)
Has not seen a provider since you on 06/2012.

## 2013-06-09 ENCOUNTER — Other Ambulatory Visit: Payer: Self-pay | Admitting: Family Medicine

## 2014-10-05 DIAGNOSIS — M47816 Spondylosis without myelopathy or radiculopathy, lumbar region: Secondary | ICD-10-CM | POA: Insufficient documentation

## 2014-10-05 DIAGNOSIS — G8929 Other chronic pain: Secondary | ICD-10-CM | POA: Insufficient documentation

## 2014-10-05 DIAGNOSIS — M25611 Stiffness of right shoulder, not elsewhere classified: Secondary | ICD-10-CM | POA: Insufficient documentation

## 2014-10-28 DIAGNOSIS — H25013 Cortical age-related cataract, bilateral: Secondary | ICD-10-CM | POA: Insufficient documentation

## 2014-10-28 DIAGNOSIS — H04123 Dry eye syndrome of bilateral lacrimal glands: Secondary | ICD-10-CM | POA: Insufficient documentation

## 2014-11-06 DIAGNOSIS — G8929 Other chronic pain: Secondary | ICD-10-CM | POA: Insufficient documentation

## 2014-12-29 DIAGNOSIS — I6523 Occlusion and stenosis of bilateral carotid arteries: Secondary | ICD-10-CM | POA: Insufficient documentation

## 2015-01-19 DIAGNOSIS — I519 Heart disease, unspecified: Secondary | ICD-10-CM | POA: Insufficient documentation

## 2015-06-18 ENCOUNTER — Ambulatory Visit (INDEPENDENT_AMBULATORY_CARE_PROVIDER_SITE_OTHER): Payer: Medicare Other | Admitting: Family Medicine

## 2015-06-18 ENCOUNTER — Encounter: Payer: Self-pay | Admitting: Family Medicine

## 2015-06-18 VITALS — BP 108/79 | HR 86 | Temp 97.9°F | Ht 63.0 in | Wt 182.6 lb

## 2015-06-18 DIAGNOSIS — K219 Gastro-esophageal reflux disease without esophagitis: Secondary | ICD-10-CM | POA: Diagnosis not present

## 2015-06-18 DIAGNOSIS — M722 Plantar fascial fibromatosis: Secondary | ICD-10-CM

## 2015-06-18 DIAGNOSIS — R251 Tremor, unspecified: Secondary | ICD-10-CM

## 2015-06-18 DIAGNOSIS — K921 Melena: Secondary | ICD-10-CM | POA: Diagnosis not present

## 2015-06-18 DIAGNOSIS — D6859 Other primary thrombophilia: Secondary | ICD-10-CM | POA: Diagnosis not present

## 2015-06-18 DIAGNOSIS — Z7901 Long term (current) use of anticoagulants: Secondary | ICD-10-CM | POA: Diagnosis not present

## 2015-06-18 DIAGNOSIS — D6852 Prothrombin gene mutation: Secondary | ICD-10-CM

## 2015-06-18 DIAGNOSIS — M542 Cervicalgia: Secondary | ICD-10-CM | POA: Diagnosis not present

## 2015-06-18 DIAGNOSIS — F411 Generalized anxiety disorder: Secondary | ICD-10-CM

## 2015-06-18 LAB — COAGUCHEK XS/INR WAIVED
INR: 1.5 — ABNORMAL HIGH (ref 0.9–1.1)
Prothrombin Time: 17.7 s

## 2015-06-18 LAB — GLUCOSE HEMOCUE WAIVED: Glu Hemocue Waived: 125 mg/dL — ABNORMAL HIGH (ref 65–99)

## 2015-06-18 NOTE — Patient Instructions (Addendum)
Great to meet you!  I am sending a referral to GI, you will hear within a week or so about an appointment  Look into eating foods with a low glycemic indext to manage your low blood sugars, keep a log if you check your blood sugar  Come back in 4 weeks to discuss your shaking spells  Please have your records sent to me  Come back to see Tammy or Marcelino DusterMichelle to get your INR checked in 1 month

## 2015-06-18 NOTE — Progress Notes (Signed)
HPI  Patient presents today here to establish care and discuss shaking episodes and bloody stools.  Shaking episodes Has been going on for several years, however more common over the last few months. Patient describes episodes that happen 3-4 hours after she eats she attributes to hypoglycemia She is not diabetic and is not taking any hypoglycemic medications She states that she has not checked any blood sugars that are low. She describes episodes of sweating, shaking, and low energy, she recovers with a small amount of sugar sweetened beverage, and rest. She does not have dyspnea, chest pain, shortness of breath rabies episodes.  Hematochezia Several months, describes small amount of blood on her stool, she's wondering if it's hemorrhoids She had a colonoscopy at [facility] 5 years ago She also describes severe GERD helped by Protonix due to a hiatal hernia. She was told she had white spots in her esophagus but they did not biopsy them due to her being on Coumadin  Chronic anticoagulation, prothrombin mutation Taking warfarin 4 mg daily No bleeding  Anxiety Doing well with Celexa and Ativan Takes 0.5 mg Ativan twice daily and 1 mg before bed  Chronic neck pain, plantar fasciitis Would like referral to physical therapy, she seen them for chronic back pain and they have stated that they have extra exercises that may help her.  PMH: Smoking status noted ROS: Per HPI  Objective: BP 108/79 mmHg  Pulse 86  Temp(Src) 97.9 F (36.6 C) (Oral)  Ht  (1.6 m)  Wt 182 lb 9.6 oz (82.827 kg)  BMI 32.35 kg/m2  LMP 04/05/1982 Gen: NAD, alert, cooperative with exam HEENT: NCAT, EOMI, PERRL CV: RRR, good S1/S2, no murmur Resp: CTABL, no wheezes, non-labored Abd: SNTND, BS present, no guarding or organomegaly Ext: No edema, warm Neuro: Alert and oriented, No gross deficits  Assessment and plan:  # Shaking episodes Blood sugars normal and her checked today, however she's  asymptomatic Discussed possible hypoglycemia however do not see good reason for this Discussed low glycemic index foods Monitor for additional associated symptoms, watchful waiting for now  # Chronic anticoagulation, primary hypercoagulable state, prothrombin mutation INR is subtherapeutic today, increase by 4 mg per week, this is a 14% increase She was taking 4 mg daily, increase to 4 mg daily and 6 mg on Monday and Friday.  # Hematochezia Possibly only hemorrhoids exacerbated by anticoagulation However given hiatal hernia and severe GERD with previous EGD findings I have referred her to GI further evaluation and possible repeat EGD +/- Colonoscopy   # GERD with hiatal hernia Continue Protonix, GI as above  Anxiety Treated well with Celexa and Ativan  Plan #60 one mg Ativan, 0.5 mg morning and afternoon, 1 mg at night  Neck pain, plantar fasciitis Written physical therapy referral per her request  Orders Placed This Encounter  Procedures  . Glucose Hemocue Waived  . CoaguChek XS/INR Waived  . Ambulatory referral to Gastroenterology    Referral Priority:  Routine    Referral Type:  Consultation    Referral Reason:  Specialty Services Required    Number of Visits Requested:  1    Meds ordered this encounter  Medications  . citalopram (CELEXA) 20 MG tablet    Sig: Take 20 mg by mouth daily.  . pantoprazole (PROTONIX) 40 MG tablet    Sig: Take 40 mg by mouth daily.  . rosuvastatin (CRESTOR) 5 MG tablet    Sig: Take 5 mg by mouth daily.  . fluticasone (  FLONASE) 50 MCG/ACT nasal spray    Sig: Place into both nostrils daily.  . ferrous fumarate (HEMOCYTE - 106 MG FE) 325 (106 Fe) MG TABS tablet    Sig: Take 1 tablet by mouth.    Murtis SinkSam Vannia Pola, MD [facility] 06/18/2015, 4:28 PM

## 2015-06-21 ENCOUNTER — Encounter: Payer: Self-pay | Admitting: Internal Medicine

## 2015-06-30 ENCOUNTER — Telehealth: Payer: Self-pay | Admitting: *Deleted

## 2015-06-30 ENCOUNTER — Observation Stay (HOSPITAL_COMMUNITY)
Admission: EM | Admit: 2015-06-30 | Discharge: 2015-07-05 | Disposition: A | Discharge status: Home / Self Care | Payer: Medicare Other | Attending: Family Medicine | Admitting: Family Medicine

## 2015-06-30 ENCOUNTER — Emergency Department (HOSPITAL_COMMUNITY): Payer: Medicare Other

## 2015-06-30 ENCOUNTER — Encounter (HOSPITAL_COMMUNITY): Payer: Self-pay | Admitting: Emergency Medicine

## 2015-06-30 DIAGNOSIS — Z79899 Other long term (current) drug therapy: Secondary | ICD-10-CM | POA: Insufficient documentation

## 2015-06-30 DIAGNOSIS — R300 Dysuria: Secondary | ICD-10-CM | POA: Insufficient documentation

## 2015-06-30 DIAGNOSIS — Z86711 Personal history of pulmonary embolism: Secondary | ICD-10-CM | POA: Insufficient documentation

## 2015-06-30 DIAGNOSIS — K5792 Diverticulitis of intestine, part unspecified, without perforation or abscess without bleeding: Secondary | ICD-10-CM | POA: Diagnosis not present

## 2015-06-30 DIAGNOSIS — E039 Hypothyroidism, unspecified: Secondary | ICD-10-CM | POA: Insufficient documentation

## 2015-06-30 DIAGNOSIS — F419 Anxiety disorder, unspecified: Secondary | ICD-10-CM | POA: Insufficient documentation

## 2015-06-30 DIAGNOSIS — Z9049 Acquired absence of other specified parts of digestive tract: Secondary | ICD-10-CM | POA: Diagnosis not present

## 2015-06-30 DIAGNOSIS — K5732 Diverticulitis of large intestine without perforation or abscess without bleeding: Principal | ICD-10-CM | POA: Insufficient documentation

## 2015-06-30 DIAGNOSIS — Z7951 Long term (current) use of inhaled steroids: Secondary | ICD-10-CM | POA: Insufficient documentation

## 2015-06-30 DIAGNOSIS — Z7901 Long term (current) use of anticoagulants: Secondary | ICD-10-CM | POA: Diagnosis not present

## 2015-06-30 DIAGNOSIS — D6859 Other primary thrombophilia: Secondary | ICD-10-CM | POA: Insufficient documentation

## 2015-06-30 DIAGNOSIS — Z86718 Personal history of other venous thrombosis and embolism: Secondary | ICD-10-CM | POA: Insufficient documentation

## 2015-06-30 LAB — COMPREHENSIVE METABOLIC PANEL
ALBUMIN: 4.1 g/dL (ref 3.5–5.0)
ALK PHOS: 32 U/L — AB (ref 38–126)
ALT: 28 U/L (ref 14–54)
ANION GAP: 10 (ref 5–15)
AST: 25 U/L (ref 15–41)
BILIRUBIN TOTAL: 1.2 mg/dL (ref 0.3–1.2)
BUN: 16 mg/dL (ref 6–20)
CALCIUM: 9.8 mg/dL (ref 8.9–10.3)
CO2: 22 mmol/L (ref 22–32)
Chloride: 104 mmol/L (ref 101–111)
Creatinine, Ser: 0.72 mg/dL (ref 0.44–1.00)
GFR calc non Af Amer: >60 mL/min (ref >60)
Glucose, Bld: 101 mg/dL — ABNORMAL HIGH (ref 65–99)
POTASSIUM: 3.7 mmol/L (ref 3.5–5.1)
SODIUM: 136 mmol/L (ref 135–145)
TOTAL PROTEIN: 8 g/dL (ref 6.5–8.1)

## 2015-06-30 LAB — CBC
HEMATOCRIT: 40.6 % (ref 36.0–46.0)
HEMOGLOBIN: 13.9 g/dL (ref 12.0–15.0)
MCH: 33.1 pg (ref 26.0–34.0)
MCHC: 34.2 g/dL (ref 30.0–36.0)
MCV: 96.7 fL (ref 78.0–100.0)
Platelets: 244 10*3/uL (ref 150–400)
RBC: 4.2 MIL/uL (ref 3.87–5.11)
RDW: 12.7 % (ref 11.5–15.5)
WBC: 14.2 10*3/uL — ABNORMAL HIGH (ref 4.0–10.5)

## 2015-06-30 LAB — TYPE AND SCREEN
ABO/RH(D): B POS
ANTIBODY SCREEN: NEGATIVE

## 2015-06-30 LAB — TSH: TSH: 1.592 u[IU]/mL (ref 0.350–4.500)

## 2015-06-30 LAB — PROTIME-INR
INR: 1.32 (ref 0.00–1.49)
PROTHROMBIN TIME: 16 s — AB (ref 11.6–15.2)

## 2015-06-30 LAB — ABO/RH: ABO/RH(D): B POS

## 2015-06-30 LAB — POC OCCULT BLOOD, ED: FECAL OCCULT BLD: NEGATIVE

## 2015-06-30 MED ORDER — ONDANSETRON HCL 4 MG/2ML IJ SOLN
4.0000 mg | Freq: Four times a day (QID) | INTRAMUSCULAR | Status: DC | PRN
  Administered 2015-06-30 – 2015-07-04 (×6): 4 mg via INTRAVENOUS
  Filled 2015-06-30 (×6): qty 2

## 2015-06-30 MED ORDER — CITALOPRAM HYDROBROMIDE 20 MG PO TABS
20.0000 mg | ORAL_TABLET | Freq: Every day | ORAL | Status: DC
  Administered 2015-06-30 – 2015-07-04 (×5): 20 mg via ORAL
  Filled 2015-06-30 (×6): qty 1

## 2015-06-30 MED ORDER — WARFARIN SODIUM 6 MG PO TABS
7.0000 mg | ORAL_TABLET | Freq: Once | ORAL | Status: AC
  Administered 2015-06-30: 7 mg via ORAL
  Filled 2015-06-30: qty 1

## 2015-06-30 MED ORDER — HYDROMORPHONE HCL 1 MG/ML IJ SOLN: 1.0000 mg | INTRAMUSCULAR | Status: DC | PRN

## 2015-06-30 MED ORDER — ROSUVASTATIN CALCIUM 5 MG PO TABS
5.0000 mg | ORAL_TABLET | Freq: Every day | ORAL | Status: DC
  Administered 2015-06-30 – 2015-07-05 (×6): 5 mg via ORAL
  Filled 2015-06-30 (×6): qty 1

## 2015-06-30 MED ORDER — LORAZEPAM 1 MG PO TABS
1.0000 mg | ORAL_TABLET | Freq: Two times a day (BID) | ORAL | Status: DC
  Administered 2015-06-30 – 2015-07-05 (×10): 1 mg via ORAL
  Filled 2015-06-30 (×10): qty 1

## 2015-06-30 MED ORDER — SODIUM CHLORIDE 0.9 % IV BOLUS (SEPSIS)
1000.0000 mL | Freq: Once | INTRAVENOUS | Status: AC
  Administered 2015-06-30: 1000 mL via INTRAVENOUS

## 2015-06-30 MED ORDER — CIPROFLOXACIN IN D5W 400 MG/200ML IV SOLN
400.0000 mg | Freq: Two times a day (BID) | INTRAVENOUS | Status: DC
  Administered 2015-07-01 – 2015-07-05 (×9): 400 mg via INTRAVENOUS
  Filled 2015-06-30 (×10): qty 200

## 2015-06-30 MED ORDER — SODIUM CHLORIDE 0.9 % IV SOLN
INTRAVENOUS | Status: DC
  Administered 2015-07-01 – 2015-07-02 (×3): via INTRAVENOUS
  Administered 2015-07-02: 1000 mL via INTRAVENOUS
  Administered 2015-07-03 – 2015-07-05 (×3): via INTRAVENOUS

## 2015-06-30 MED ORDER — IOPAMIDOL (ISOVUE-300) INJECTION 61%
100.0000 mL | Freq: Once | INTRAVENOUS | Status: AC | PRN
  Administered 2015-06-30: 100 mL via INTRAVENOUS

## 2015-06-30 MED ORDER — LEVOTHYROXINE SODIUM 50 MCG PO TABS
50.0000 ug | ORAL_TABLET | Freq: Every day | ORAL | Status: DC
  Administered 2015-06-30 – 2015-07-05 (×5): 50 ug via ORAL
  Filled 2015-06-30 (×6): qty 1

## 2015-06-30 MED ORDER — ONDANSETRON HCL 4 MG/2ML IJ SOLN
4.0000 mg | Freq: Once | INTRAMUSCULAR | Status: AC
  Administered 2015-06-30: 4 mg via INTRAVENOUS
  Filled 2015-06-30: qty 2

## 2015-06-30 MED ORDER — HYDROMORPHONE HCL 1 MG/ML IJ SOLN
1.0000 mg | Freq: Once | INTRAMUSCULAR | Status: AC
  Administered 2015-06-30: 1 mg via INTRAVENOUS
  Filled 2015-06-30: qty 1

## 2015-06-30 MED ORDER — METRONIDAZOLE IN NACL 5-0.79 MG/ML-% IV SOLN
500.0000 mg | Freq: Three times a day (TID) | INTRAVENOUS | Status: DC
  Administered 2015-07-01 – 2015-07-05 (×14): 500 mg via INTRAVENOUS
  Filled 2015-06-30 (×17): qty 100

## 2015-06-30 MED ORDER — HYDROMORPHONE HCL 1 MG/ML IJ SOLN
1.0000 mg | Freq: Once | INTRAMUSCULAR | Status: AC
  Administered 2015-06-30: 1 mg via INTRAMUSCULAR
  Filled 2015-06-30: qty 1

## 2015-06-30 MED ORDER — METRONIDAZOLE IN NACL 5-0.79 MG/ML-% IV SOLN
500.0000 mg | Freq: Once | INTRAVENOUS | Status: AC
  Administered 2015-06-30: 500 mg via INTRAVENOUS
  Filled 2015-06-30: qty 100

## 2015-06-30 MED ORDER — WARFARIN - PHARMACIST DOSING INPATIENT: Freq: Every day | Status: DC

## 2015-06-30 MED ORDER — ONDANSETRON HCL 4 MG/2ML IJ SOLN
4.0000 mg | Freq: Three times a day (TID) | INTRAMUSCULAR | Status: DC | PRN
  Administered 2015-06-30: 4 mg via INTRAVENOUS
  Filled 2015-06-30: qty 2

## 2015-06-30 MED ORDER — CIPROFLOXACIN IN D5W 400 MG/200ML IV SOLN
400.0000 mg | Freq: Once | INTRAVENOUS | Status: AC
  Administered 2015-06-30: 400 mg via INTRAVENOUS
  Filled 2015-06-30: qty 200

## 2015-06-30 MED ORDER — FLUTICASONE PROPIONATE 50 MCG/ACT NA SUSP
2.0000 | Freq: Every day | NASAL | Status: DC
  Administered 2015-06-30 – 2015-07-05 (×6): 2 via NASAL
  Filled 2015-06-30: qty 16

## 2015-06-30 MED ORDER — ONDANSETRON HCL 4 MG PO TABS
4.0000 mg | ORAL_TABLET | Freq: Four times a day (QID) | ORAL | Status: DC | PRN
  Administered 2015-07-02: 4 mg via ORAL
  Filled 2015-06-30: qty 1

## 2015-06-30 MED ORDER — HYDROMORPHONE HCL 1 MG/ML IJ SOLN
1.0000 mg | INTRAMUSCULAR | Status: DC | PRN
  Administered 2015-06-30 – 2015-07-05 (×8): 1 mg via INTRAVENOUS
  Filled 2015-06-30 (×8): qty 1

## 2015-06-30 NOTE — ED Notes (Signed)
Attempted to call report to [address] RN, unable to take report at this time.

## 2015-06-30 NOTE — H&P (Signed)
TRH H&P   Patient Demographics:    Sherry Levine, is a 70 y.o. female  MRN: [id number]   DOB - [birth year]  Admit Date - 06/30/2015  Outpatient Primary MD for the patient is Kevin FentonSamuel Bradshaw, MD  Referring MD/NP/PA: Dr Denton LankSteinl  Patient coming from: Home   Chief Complaint  Patient presents with  . Abdominal Pain      HPI:    Sherry CharterBetty Levine  is a 70 y.o. female, With history of DVT/pulmonary embolism, hypothyroidism, osteoarthritis who came to the hospital with 1 day history of severe abdominal pain associated with passing of mucus and blood per rectum. Patient denies fever but complains of being cold. Denies vomiting but had nausea. Patient complains of increased frequency of urination. No dysuria. Patient currently takes cortisone for DVT/PE. She had previous surgery of hysterectomy and cholecystectomy. In the ED patient was found to have diverticulitis on the CT abdomen. Started on Cipro and Flagyl.    Review of systems:    In addition to the HPI above,   No Headache, No changes with Vision or hearing, No problems swallowing food or Liquids, No Chest pain, Cough or Shortness of Breath, No Abdominal pain, No Nausea or Vommitting, Bowel movements are regular, No Blood in stool or Urine + hotflashes + complains of pain in the left foot, plantar fasciitis No new weakness, tingling, numbness in any extremity, No recent weight gain or loss, No polyuria, polydypsia or polyphagia,   A full 10 point Review of Systems was done, except as stated above, all other Review of Systems were negative.   With Past History of the following :    Past Medical History  Diagnosis Date  . Pulmonary embolism (HCC)   . DVT (deep venous thrombosis) (HCC)   . Thyroid disease   . Osteoarthritis   . Plantar fasciitis   . Allergy   . Anxiety   . Clotting disorder [facility](HCC)       Past Surgical History  Procedure  Laterality Date  . Abdominal hysterectomy    . Cervical fusion    . Knee arthroscopy with lateral menisectomy      right knee  . Carpel tunnel      right hand  . Cholecystectomy  2015      Social History:     Social History  Substance Use Topics  . Smoking status: Never Smoker   . Smokeless tobacco: Not on file  . Alcohol Use: No     Lives - At home with her husband  Mobility - no limitation of mobility     Family History :     Family History  Problem Relation Age of Onset  . Stroke Mother   . Heart disease Mother   . Stroke Father   . Heart disease Father      Home Medications:   Prior to Admission medications   Medication Sig Start Date End Date Taking? Authorizing Provider  cetirizine (ZYRTEC) 10 MG tablet TAKE 1 TABLET BY MOUTH EVERY DAY  Yes Deatra Canter, FNP  citalopram (CELEXA) 20 MG tablet Take 20 mg by mouth at bedtime.    Yes Historical Provider, MD  ferrous fumarate (HEMOCYTE - 106 MG FE) 325 (106 Fe) MG TABS tablet Take 1 tablet by mouth daily.    Yes Historical Provider, MD  fluticasone (FLONASE) 50 MCG/ACT nasal spray Place into both nostrils daily.   Yes Historical Provider, MD  levothyroxine (SYNTHROID, LEVOTHROID) 50 MCG tablet TAKE 1 TABLET BY MOUTH EVERY DAY 01/14/13  Yes Ernestina Penna, MD  LORazepam (ATIVAN) 1 MG tablet Take 1 tablet (1 mg total) by mouth at bedtime as needed for anxiety. Patient taking differently: Take 1 mg by mouth 2 (two) times daily.  07/16/12  Yes Mary-Margaret Daphine Deutscher, FNP  pantoprazole (PROTONIX) 40 MG tablet Take 40 mg by mouth daily.   Yes Historical Provider, MD  rosuvastatin (CRESTOR) 5 MG tablet Take 5 mg by mouth daily.   Yes Historical Provider, MD  warfarin (COUMADIN) 4 MG tablet Take 1 tablet (4 mg total) by mouth daily. Alternating /6mg  daily Patient taking differently: Take 4-6 mg by mouth daily. Takes 4 mg everyday except Monday and Friday she takes 6 mg 04/04/12  Yes Mary-Margaret Daphine Deutscher, FNP      Allergies:     Allergies  Allergen Reactions  . Lyrica [Pregabalin]     MAKES HER CRAZY  . Omnicef [Cefdinir]     CANT REMEMBER  . Penicillins Itching    Has patient had a PCN reaction causing immediate rash, facial/tongue/throat swelling, SOB or lightheadedness with hypotension: No Has patient had a PCN reaction causing severe rash involving mucus membranes or skin necrosis: No Has patient had a PCN reaction that required hospitalization No Has patient had a PCN reaction occurring within the last 10 years: No If all of the above answers are "NO", then may proceed with Cephalosporin use.  Marland Kitchen Zithromax [Azithromycin] Nausea And Vomiting  . Celebrex [Celecoxib] Hives, Itching and Rash  . Codeine Itching and Rash    throat swells. States that she tolerates vicodin     Physical Exam:   Vitals  Blood pressure 140/73, pulse 76, temperature 99.4 F (37.4 C), temperature source Oral, resp. rate 19, last menstrual period 04/05/1982, SpO2 97 %.   1. General 70 year old female lying in bed in NAD, cooperative with exam  2. Normal affect and insight, Not Suicidal or Homicidal, Awake Alert, Oriented X 3.  3. No F.N deficits, ALL C.Nerves Intact, Strength 5/5 all 4 extremities, Sensation intact all 4 extremities, Plantars down going.  4. Ears and Eyes appear Normal, Conjunctivae clear, PERRLA. Moist Oral Mucosa.  5. Supple Neck, No JVD, No cervical lymphadenopathy appriciated, No Carotid Bruits.  6. Symmetrical Chest wall movement, Good air movement bilaterally, CTAB.  7. RRR, No Gallops, Rubs or Murmurs, No Parasternal Heave.  8. Positive Bowel Sounds, Abdomen Soft, tenderness to palpation in the left lower quadrant No organomegaly appriciated,No rebound -guarding or rigidity.  9.  No Cyanosis, Normal Skin Turgor, No Skin Rash or Bruise.  10. Good muscle tone,  joints appear normal , no effusions, Normal ROM.      Data Review:    CBC  Recent Labs Lab 06/30/15 1350  WBC  14.2*  HGB 13.9  HCT 40.6  PLT 244  MCV 96.7  MCH 33.1  MCHC 34.2  RDW 12.7   ------------------------------------------------------------------------------------------------------------------  Chemistries   Recent Labs Lab 06/30/15 1350  NA 136  K 3.7  CL 104  CO2 22  GLUCOSE 101*  BUN 16  CREATININE 0.72  CALCIUM 9.8  AST 25  ALT 28  ALKPHOS 32*  BILITOT 1.2   ------------------------------------------------------------------------------------------------------------------  Coagulation profile  Recent Labs Lab 06/30/15 1350  INR 1.32     ---------------------------------------------------------------------------------------------------------------  Urinalysis No results found for: COLORURINE, APPEARANCEUR, LABSPEC, PHURINE, GLUCOSEU, HGBUR, BILIRUBINUR, KETONESUR, PROTEINUR, UROBILINOGEN, NITRITE, LEUKOCYTESUR  ----------------------------------------------------------------------------------------------------------------   Imaging Results:    Ct Abdomen Pelvis W Contrast  06/30/2015  CLINICAL DATA:  Lower abdominal pain radiating from LEFT lower quadrant to RIGHT lower quadrant for 4 days. Rectal pressure. EXAM: CT ABDOMEN AND PELVIS WITH CONTRAST TECHNIQUE: Multidetector CT imaging of the abdomen and pelvis was performed using the standard protocol following bolus administration of intravenous contrast. CONTRAST:  ISOVUE-300 IOPAMIDOL (ISOVUE-300) INJECTION 61% COMPARISON:  Report from CT scan dated 05/13/2013 from [facility]. FINDINGS: Lower chest: Large hiatal hernia with a significant percentage of the stomach in the chest. No evidence for obstruction of the distal esophagus or gastric outlet compromise. Hepatobiliary: No masses or other significant abnormality. Status post cholecystectomy. The new new Pancreas: No mass, inflammatory changes, or other significant abnormality. Spleen: Within normal limits in size and appearance. Adrenals/Urinary  Tract: No masses identified. No evidence of hydronephrosis. 2 x 3 mm nonobstructing renal calculus, LEFT lower pole. Stomach/Bowel: No evidence of obstruction, or abnormal fluid collection. Sigmoid diverticulosis, with indistinct surrounding soft tissues, and stranding of the pericolonic fat, consistent with sigmoid diverticulitis. No visible abscess. No free fluid or free air. Unremarkable appendix. Vascular/Lymphatic: No pathologically enlarged lymph nodes. No evidence of abdominal aortic aneurysm. 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 Reproductive: No mass or other significant abnormality. Other: None. Musculoskeletal:  No suspicious bone lesions identified. IMPRESSION: Sigmoid diverticulitis. No frank perforation, abscess, or free fluid. No bowel obstruction. 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 Nonobstructing LEFT renal calculus, 2 x 3 mm. Electronically Signed   By: Elsie Stain M.D.   On: 06/30/2015 15:46       Assessment & Plan:    Active Problems:   Acute diverticulitis   Diverticulitis     1. Diverticulitis- making the patient nothing by mouth, IV fluids normal saline at 100 mL per hr. continue Cipro and Flagyl. If patient improves consider starting clear liquid diet in a.m. follow CBC and BMP in a.m. 2. History of DVT/PE- INR is subtherapeutic 1.32, to start Coumadin per pharmacy consultation. Will avoid bridging with heparin or Lovenox at this time due to diverticulitis, bloody bowel movements. 3. Hypothyroidism- continue Synthroid, vitamin recheck TSH. Patient is complaining of hot flashes with sweating.    DVT Prophylaxis-   Patient on Coumadin  AM Labs Ordered, also please review Full Orders  Family Communication: Admission, patients condition and plan of care including tests being ordered have been discussed with the patient and her neighbor at bedside who  indicate understanding and agree with the plan and Code Status.  Code Status:  Full code  Admission status: Observation  Time spent in minutes :   Amilya Haver S M.D on 06/30/2015 at 5:21 PM  Between 7am to 7pm - Pager - [phone]. After 7pm go to [email or web address] - password [facility]  [facility] - Office  [phone]

## 2015-06-30 NOTE — ED Notes (Signed)
Pt states that she takes coumadin.  Had an appt for colonoscopy in august.  Yesterday, began having lower abd cramps, from left to right.  Pressure in rectum, pain in vagina.  Started passing blood and mucous in her stool.  Clots of bright red blood.  Nauseated, weak.

## 2015-06-30 NOTE — Progress Notes (Signed)
ANTICOAGULATION CONSULT NOTE - Initial Consult  Pharmacy Consult for warfarin Indication: VTE treatment  Allergies  Allergen Reactions  . Lyrica [Pregabalin]     MAKES HER CRAZY  . Omnicef [Cefdinir]     CANT REMEMBER  . Penicillins Itching    Has patient had a PCN reaction causing immediate rash, facial/tongue/throat swelling, SOB or lightheadedness with hypotension: No Has patient had a PCN reaction causing severe rash involving mucus membranes or skin necrosis: No Has patient had a PCN reaction that required hospitalization No Has patient had a PCN reaction occurring within the last 10 years: No If all of the above answers are "NO", then may proceed with Cephalosporin use.  Marland Kitchen. Zithromax [Azithromycin] Nausea And Vomiting  . Celebrex [Celecoxib] Hives, Itching and Rash  . Codeine Itching and Rash    throat swells. States that she tolerates vicodin     Vital Signs: Temp: 98.2 F (36.8 C) (06/14 1834) Temp Source: Oral (06/14 1834) BP: 148/80 mmHg (06/14 1834) Pulse Rate: 75 (06/14 1834)  Labs:  Recent Labs  06/30/15 1350  HGB 13.9  HCT 40.6  PLT 244  LABPROT 16.0*  INR 1.32  CREATININE 0.72    Estimated Creatinine Clearance: 66.7 mL/min (by C-G formula based on Cr of 0.72).   Medical History: Past Medical History  Diagnosis Date  . Pulmonary embolism (HCC)   . DVT (deep venous thrombosis) (HCC)   . Thyroid disease   . Osteoarthritis   . Plantar fasciitis   . Allergy   . Anxiety   . Clotting disorder [facility](HCC)      Assessment: 70 y.o. female, With history of DVT/pulmonary embolism, hypothyroidism, osteoarthritis who came to the hospital with 1 day history of severe abdominal pain associated with passing of mucus and blood per rectum.  In the ED patient was found to have diverticulitis on the CT abdomen.  Pt take warfarin 4mg  daily except 6mg  on Monday and Friday.  Last dose 6/13@ 11am  INR 1.32 (subtherapeutic) H/H WNL Plts WNL Drug interactions: pt  started on cipro and flagyl which may result in increased risk of bleeding  Goal of Therapy:  INR 2-3   Plan:  Warfarin 7mg  po x 1 today Daily INR   Arley PhenixEllen Alliah Boulanger RPh 06/30/2015, 7:01 PM Pager [phone]

## 2015-06-30 NOTE — ED Provider Notes (Signed)
CSN: [id number]     Arrival date & time 06/30/15  1150 History   First MD Initiated Contact with Patient 06/30/15 1352     Chief Complaint  Patient presents with  . Abdominal Pain     (Consider location/radiation/quality/duration/timing/severity/associated sxs/prior Treatment) Patient is a 70 y.o. female presenting with abdominal pain. The history is provided by the patient.  Abdominal Pain Associated symptoms: dysuria and nausea   Associated symptoms: no chest pain, no chills, no cough, no fever, no shortness of breath and no sore throat   Patient c/o lower abd pain, esp on lefto ,for the past 2 days. Pain constant, mod-severe, non radiating, worse w palpation.  No hx same pain. Denies fever or chills. Nausea. No vomiting. bm yesterday, states mucous w streaks blood. No melena.   States last colonoscopy 9 yrs ago, and reports as normal. Denies hx diverticula.  No recent wt loss or gain. +dysuria, and increased urine frequency, no hematuria. Is on coumadin for hx dvt/pe, no other abnormal bruising or bleeding. No back or flank pain.  Prior abd surgery includes remote hx hysterectomy and cholecystectomy.     Past Medical History  Diagnosis Date  . Pulmonary embolism (HCC)   . DVT (deep venous thrombosis) (HCC)   . Thyroid disease   . Osteoarthritis   . Plantar fasciitis   . Allergy   . Anxiety   . Cancer (HCC)   . Clotting disorder [facility])    Past Surgical History  Procedure Laterality Date  . Abdominal hysterectomy    . Cervical fusion    . Knee arthroscopy with lateral menisectomy      right knee  . Carpel tunnel      right hand  . Cholecystectomy  2015   Family History  Problem Relation Age of Onset  . Stroke Mother   . Heart disease Mother   . Stroke Father   . Heart disease Father    Social History  Substance Use Topics  . Smoking status: Never Smoker   . Smokeless tobacco: None  . Alcohol Use: No   OB History    No data available     Review of Systems   Constitutional: Negative for fever and chills.  HENT: Negative for sore throat.   Eyes: Negative for redness.  Respiratory: Negative for cough and shortness of breath.   Cardiovascular: Negative for chest pain.  Gastrointestinal: Positive for nausea and abdominal pain.  Genitourinary: Positive for dysuria and frequency. Negative for flank pain.  Musculoskeletal: Negative for back pain and neck pain.  Skin: Negative for rash.  Neurological: Negative for headaches.  Hematological: Does not bruise/bleed easily.  Psychiatric/Behavioral: Negative for confusion.      Allergies  Lyrica; Omnicef; Penicillins; Zithromax; Celebrex; and Codeine  Home Medications   Prior to Admission medications   Medication Sig Start Date End Date Taking? Authorizing Provider  cetirizine (ZYRTEC) 10 MG tablet TAKE 1 TABLET BY MOUTH EVERY DAY   Yes Deatra Canter, FNP  citalopram (CELEXA) 20 MG tablet Take 20 mg by mouth at bedtime.    Yes Historical Provider, MD  ferrous fumarate (HEMOCYTE - 106 MG FE) 325 (106 Fe) MG TABS tablet Take 1 tablet by mouth daily.    Yes Historical Provider, MD  fluticasone (FLONASE) 50 MCG/ACT nasal spray Place into both nostrils daily.   Yes Historical Provider, MD  levothyroxine (SYNTHROID, LEVOTHROID) 50 MCG tablet TAKE 1 TABLET BY MOUTH EVERY DAY 01/14/13  Yes Ernestina Penna, MD  LORazepam (ATIVAN) 1 MG tablet Take 1 tablet (1 mg total) by mouth at bedtime as needed for anxiety. Patient taking differently: Take 1 mg by mouth 2 (two) times daily.  07/16/12  Yes Mary-Margaret Daphine Deutscher, FNP  pantoprazole (PROTONIX) 40 MG tablet Take 40 mg by mouth daily.   Yes Historical Provider, MD  rosuvastatin (CRESTOR) 5 MG tablet Take 5 mg by mouth daily.   Yes Historical Provider, MD  warfarin (COUMADIN) 4 MG tablet Take 1 tablet (4 mg total) by mouth daily. Alternating 4mg /6mg  daily Patient taking differently: Take 4-6 mg by mouth daily. Takes 4 mg everyday except Monday and Friday she  takes 6 mg 04/04/12  Yes Mary-Margaret Daphine Deutscher, FNP   BP 117/71 mmHg  Pulse 75  Temp(Src) 98.1 F (36.7 C) (Oral)  Resp 17  SpO2 99%  LMP 04/05/1982 Physical Exam  Constitutional: She appears well-developed and well-nourished. No distress.  HENT:  Mouth/Throat: Oropharynx is clear and moist.  Eyes: Conjunctivae are normal. No scleral icterus.  Neck: Neck supple. No tracheal deviation present.  Cardiovascular: Normal rate, regular rhythm, normal heart sounds and intact distal pulses.  Exam reveals no gallop and no friction rub.   No murmur heard. Pulmonary/Chest: Effort normal and breath sounds normal. No respiratory distress.  Abdominal: Soft. Normal appearance and bowel sounds are normal. She exhibits no distension. There is no tenderness.  Genitourinary:  No cva tenderness  Musculoskeletal: She exhibits no edema.  Neurological: She is alert.  Skin: Skin is warm and dry. No rash noted. She is not diaphoretic.  Psychiatric: She has a normal mood and affect.  Nursing note and vitals reviewed.   ED Course  Procedures (including critical care time) Labs Review   Results for orders placed or performed during the hospital encounter of 06/30/15  Comprehensive metabolic panel  Result Value Ref Range   Sodium 136 135 - 145 mmol/L   Potassium 3.7 3.5 - 5.1 mmol/L   Chloride 104 101 - 111 mmol/L   CO2 22 22 - 32 mmol/L   Glucose, Bld 101 (H) 65 - 99 mg/dL   BUN 16 6 - 20 mg/dL   Creatinine, Ser 1.61 0.44 - 1.00 mg/dL   Calcium 9.8 8.9 - 09.6 mg/dL   Total Protein 8.0 6.5 - 8.1 g/dL   Albumin 4.1 3.5 - 5.0 g/dL   AST 25 15 - 41 U/L   ALT 28 14 - 54 U/L   Alkaline Phosphatase 32 (L) 38 - 126 U/L   Total Bilirubin 1.2 0.3 - 1.2 mg/dL   GFR calc non Af Amer >60 >60 mL/min   GFR calc Af Amer >60 >60 mL/min   Anion gap 10 5 - 15  CBC  Result Value Ref Range   WBC 14.2 (H) 4.0 - 10.5 K/uL   RBC 4.20 3.87 - 5.11 MIL/uL   Hemoglobin 13.9 12.0 - 15.0 g/dL   HCT 04.5 40.9 - 81.1 %    MCV 96.7 78.0 - 100.0 fL   MCH 33.1 26.0 - 34.0 pg   MCHC 34.2 30.0 - 36.0 g/dL   RDW 91.4 78.2 - 95.6 %   Platelets 244 150 - 400 K/uL  Protime-INR - (order if Patient is taking Coumadin / Warfarin)  Result Value Ref Range   Prothrombin Time 16.0 (H) 11.6 - 15.2 seconds   INR 1.32 0.00 - 1.49  POC occult blood, ED  Result Value Ref Range   Fecal Occult Bld NEGATIVE NEGATIVE  Type and screen [facility]  Result Value Ref Range   ABO/RH(D) B POS    Antibody Screen NEG    Sample Expiration 07/03/2015   ABO/Rh  Result Value Ref Range   ABO/RH(D) B POS    Ct Abdomen Pelvis W Contrast  06/30/2015  CLINICAL DATA:  Lower abdominal pain radiating from LEFT lower quadrant to RIGHT lower quadrant for 4 days. Rectal pressure. EXAM: CT ABDOMEN AND PELVIS WITH CONTRAST TECHNIQUE: Multidetector CT imaging of the abdomen and pelvis was performed using the standard protocol following bolus administration of intravenous contrast. CONTRAST:  100mL ISOVUE-300 IOPAMIDOL (ISOVUE-300) INJECTION 61% COMPARISON:  [facility] CT [facility] FINDINGS: Lower chest: Large hiatal hernia with a significant percentage of the stomach in the chest. No evidence for obstruction of the distal esophagus or gastric outlet compromise. Hepatobiliary: No masses or other significant abnormality. Status post cholecystectomy. The new new Pancreas: No mass, inflammatory changes, or other significant abnormality. Spleen: Within normal limits in size and appearance. Adrenals/Urinary Tract: No masses identified. No evidence of hydronephrosis. 2 x 3 mm nonobstructing renal calculus, LEFT lower pole. Stomach/Bowel: No evidence of obstruction, or abnormal fluid collection. Sigmoid diverticulosis, with indistinct surrounding soft tissues, and stranding of the pericolonic fat, consistent with sigmoid diverticulitis. No visible abscess. No free fluid or free air. Unremarkable appendix.  Vascular/Lymphatic: No pathologically enlarged lymph nodes. No evidence of abdominal aortic aneurysm. 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 Reproductive: No mass or other significant abnormality. Other: None. Musculoskeletal:  No suspicious bone lesions identified. IMPRESSION: Sigmoid diverticulitis. No frank perforation, abscess, or free fluid. No bowel obstruction. 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 Nonobstructing LEFT renal calculus, 2 x 3 mm. Electronically Signed   By: Elsie StainJohn T Curnes M.D.   On: 06/30/2015 15:46       I have personally reviewed and evaluated these images and lab results as part of my medical decision-making.    MDM   Iv ns bolus. Labs.   Reviewed nursing notes and prior charts for additional history.   Dilaudid 1 mg iv for pain. zofran iv for nausea.  Recheck pain improved but persists.   Given persistent nausea, weakness, pain, will admit for iv abx.  cipro and flagyl iv (note is on coumadin, but sub therapeutic, and allergies to pcn and ceph).   Hospitalists consulted for admission.      Cathren LaineKevin Alissa Pharr, MD 06/30/15 1556

## 2015-06-30 NOTE — Telephone Encounter (Signed)
The patient told me was of yesterday 6-13 she started with lower  abdominal cramping, "real bad, like child birth".  Also she has had bright red blood and mucus. She asked if Dr. Leone PayorGessner had any openings today?  I advised he was not seeing patient's today, [facility]  She [facility] strongly [facility]  I looked at his tomorrow schedule and he was full.  I put her on hold to ask Amy if she would see her but when I got back on the phone with her she said she just had several large BRB clots come our of her rectum. She made the decision to go to the ED.  [facility].

## 2015-06-30 NOTE — Telephone Encounter (Signed)
The patient called to advise yesterday 06-29-15 she started to have bad

## 2015-06-30 NOTE — ED Notes (Signed)
Pt sleeping, O2 sats 82% on RA, placed patient on 2L [place] O2, Dr. Denton LankSteinl notified, called floor RN to give update.

## 2015-07-01 DIAGNOSIS — K5792 Diverticulitis of intestine, part unspecified, without perforation or abscess without bleeding: Secondary | ICD-10-CM | POA: Diagnosis not present

## 2015-07-01 LAB — COMPREHENSIVE METABOLIC PANEL
ALBUMIN: 3.2 g/dL — AB (ref 3.5–5.0)
ALK PHOS: 26 U/L — AB (ref 38–126)
ALT: 22 U/L (ref 14–54)
ANION GAP: 5 (ref 5–15)
AST: 18 U/L (ref 15–41)
BUN: 11 mg/dL (ref 6–20)
CALCIUM: 8.7 mg/dL — AB (ref 8.9–10.3)
CO2: 25 mmol/L (ref 22–32)
Chloride: 107 mmol/L (ref 101–111)
Creatinine, Ser: 0.76 mg/dL (ref 0.44–1.00)
GFR calc Af Amer: >60 mL/min (ref >60)
GFR calc non Af Amer: >60 mL/min (ref >60)
GLUCOSE: 95 mg/dL (ref 65–99)
POTASSIUM: 3.9 mmol/L (ref 3.5–5.1)
SODIUM: 137 mmol/L (ref 135–145)
Total Bilirubin: 0.8 mg/dL (ref 0.3–1.2)
Total Protein: 6.7 g/dL (ref 6.5–8.1)

## 2015-07-01 LAB — URINALYSIS, ROUTINE W REFLEX MICROSCOPIC
Bilirubin Urine: NEGATIVE
Glucose, UA: NEGATIVE mg/dL
Ketones, ur: NEGATIVE mg/dL
NITRITE: NEGATIVE
Protein, ur: NEGATIVE mg/dL
SPECIFIC GRAVITY, URINE: 1.008 (ref 1.005–1.030)
pH: 6 (ref 5.0–8.0)

## 2015-07-01 LAB — URINE MICROSCOPIC-ADD ON

## 2015-07-01 LAB — CBC
HCT: 36.1 % (ref 36.0–46.0)
HEMOGLOBIN: 11.7 g/dL — AB (ref 12.0–15.0)
MCH: 33 pg (ref 26.0–34.0)
MCHC: 32.4 g/dL (ref 30.0–36.0)
MCV: 101.7 fL — ABNORMAL HIGH (ref 78.0–100.0)
Platelets: 199 10*3/uL (ref 150–400)
RBC: 3.55 MIL/uL — ABNORMAL LOW (ref 3.87–5.11)
RDW: 13.2 % (ref 11.5–15.5)
WBC: 13.2 10*3/uL — AB (ref 4.0–10.5)

## 2015-07-01 LAB — PROTIME-INR
INR: 1.45 (ref 0.00–1.49)
Prothrombin Time: 17.2 seconds — ABNORMAL HIGH (ref 11.6–15.2)

## 2015-07-01 MED ORDER — WARFARIN SODIUM 6 MG PO TABS
6.0000 mg | ORAL_TABLET | Freq: Once | ORAL | Status: AC
  Administered 2015-07-01: 6 mg via ORAL
  Filled 2015-07-01: qty 1

## 2015-07-01 MED ORDER — PROCHLORPERAZINE EDISYLATE 5 MG/ML IJ SOLN
5.0000 mg | Freq: Four times a day (QID) | INTRAMUSCULAR | Status: DC | PRN
  Administered 2015-07-01: 5 mg via INTRAVENOUS
  Filled 2015-07-01: qty 2

## 2015-07-01 MED ORDER — ACETAMINOPHEN 325 MG PO TABS
650.0000 mg | ORAL_TABLET | Freq: Four times a day (QID) | ORAL | Status: DC | PRN
  Administered 2015-07-01 – 2015-07-02 (×2): 650 mg via ORAL
  Filled 2015-07-01 (×3): qty 2

## 2015-07-01 NOTE — Progress Notes (Signed)
ANTICOAGULATION CONSULT NOTE - Follow Up  Pharmacy Consult for warfarin Indication: VTE treatment  Allergies  Allergen Reactions  . Lyrica [Pregabalin]     MAKES HER CRAZY  . Omnicef [Cefdinir]     CANT REMEMBER  . Penicillins Itching    Has patient had a PCN reaction causing immediate rash, facial/tongue/throat swelling, SOB or lightheadedness with hypotension: No Has patient had a PCN reaction causing severe rash involving mucus membranes or skin necrosis: No Has patient had a PCN reaction that required hospitalization No Has patient had a PCN reaction occurring within the last 10 years: No If all of the above answers are "NO", then may proceed with Cephalosporin use.  Marland Kitchen. Zithromax [Azithromycin] Nausea And Vomiting  . Celebrex [Celecoxib] Hives, Itching and Rash  . Codeine Itching and Rash    throat swells. States that she tolerates vicodin     Vital Signs: Temp: 99.8 F (37.7 C) (06/15 0514) Temp Source: Oral (06/15 0514) BP: 133/66 mmHg (06/15 0514) Pulse Rate: 79 (06/15 0514)  Labs:  Recent Labs  06/30/15 1350 07/01/15 0505  HGB 13.9 11.7*  HCT 40.6 36.1  PLT 244 199  LABPROT 16.0* 17.2*  INR 1.32 1.45  CREATININE 0.72 0.76    Estimated Creatinine Clearance: 66.7 mL/min (by C-G formula based on Cr of 0.76).   Medical History: Past Medical History  Diagnosis Date  . Pulmonary embolism (HCC)   . DVT (deep venous thrombosis) (HCC)   . Thyroid disease   . Osteoarthritis   . Plantar fasciitis   . Allergy   . Anxiety   . Clotting disorder [facility](HCC)      Assessment: 70 y.o. female, With history of DVT/pulmonary embolism, hypothyroidism, osteoarthritis who came to the hospital with 1 day history of severe abdominal pain associated with passing of mucus and blood per rectum.  In the ED patient was found to have diverticulitis on the CT abdomen.  Pt take warfarin 4mg  daily except 6mg  on Monday and Friday.  Last dose 6/13@ 11am  Today, 07/01/2015  INR  subtherapeutic after 7mg  x 1 last PM  CBC OK  No reported bleeding  Drug interactions: pt started on cipro and flagyl which will result in increased risk of bleeding  Goal of Therapy:  INR 2-3   Plan:  1) Warfarin 6mg  today, expect INR to start rising with concurrent Cipro/Flagyl  2) Daily INR   Hessie KnowsJustin M Jamori Biggar, PharmD, BCPS Pager [phone] 07/01/2015 8:16 AM

## 2015-07-01 NOTE — Progress Notes (Signed)
[facility]  PROGRESS NOTE  Sherry Levine ZOX:[id number] DOB: [birth year] DOA: 06/30/2015 PCP: Kevin Fenton, MD    Brief HPI:  70 y.o. female, With history of DVT/pulmonary embolism, hypothyroidism, osteoarthritis who came to the hospital with 1 day history of severe abdominal pain associated with passing of mucus and blood per rectum. Patient denies fever but complains of being cold. Denies vomiting but had nausea. Patient complains of increased frequency of urination. No dysuria. Patient currently takes cortisone for DVT/PE. She had previous surgery of hysterectomy and cholecystectomy. In the ED patient was found to have diverticulitis on the CT abdomen. Started on Cipro and Flagyl.  Active Problems:   Acute diverticulitis   Diverticulitis   Assessment/Plan: 1. Diverticulitis- improving, continue Cipro and Flagyl. Will start patient on clear liquid diet. Follow CBC in a.m. 2. Dysuria- check urinalysis and urine culture. Patient is already on ciprofloxacin. 3. History of DVT/PE- continue Coumadin per pharmacy consultation. 4. Hypothyroidism- continue Synthroid, TSH 1.592.   DVT prophylaxis: Lovenox Code Status: Full code Family Communication: No family present at bedside Disposition Plan:  Home in 1-2 days   Consultants:  None  Procedures:  None  Antibiotics:  Ciprofloxacin  Flagyl  Subjective: Patient seen and examined, since the pain is improved. Complains of dysuria.  Objective: Filed Vitals:   06/30/15 1752 06/30/15 1834 06/30/15 2053 07/01/15 0514  BP: 124/84 148/80 148/80 133/66  Pulse: 79 75 71 79  Temp:  98.2 F (36.8 C) 98.5 F (36.9 C) 99.8 F (37.7 C)  TempSrc:  Oral Oral Oral  Resp: SpO2: 98% 100% 100% 100%   No intake or output data in the 24 hours ending 07/01/15 1353 There were no vitals filed for this visit.  Examination:  General exam: Appears calm and comfortable  Respiratory system: Clear to auscultation.  Respiratory effort normal. Cardiovascular system: S1 & S2 heard, RRR. No JVD, murm  Abdomen - mild tenderness in left lower quadrant. No organomegaly or masses felt. Normal bowel sounds heard. Central nervous system: Alert and oriented. No focal neurological deficits. Extremities: Symmetric 5 x 5 power. Skin: No rashes, lesions or ulcers Psychiatry: Judgement and insight appear normal. Mood & affect appropriate.    Data Reviewed: I have personally reviewed following labs and imaging studies Basic Metabolic Panel:  Recent Labs Lab 06/30/15 1350 07/01/15 0505  NA 136 137  K 3.7 3.9  CL 104 107  CO2 22 25  GLUCOSE 101* 95  BUN 16 11  CREATININE 0.72 0.76  CALCIUM 9.8 8.7*   Liver Function Tests:  Recent Labs Lab 06/30/15 1350 07/01/15 0505  AST 25 18  ALT 28 22  ALKPHOS 32* 26*  BILITOT 1.2 0.8  PROT 8.0 6.7  ALBUMIN 4.1 3.2*   No results for input(s): LIPASE, AMYLASE in the last 168 hours. No results for input(s): AMMONIA in the last 168 hours. CBC:  Recent Labs Lab 06/30/15 1350 07/01/15 0505  WBC 14.2* 13.2*  HGB 13.9 11.7*  HCT 40.6 36.1  MCV 96.7 101.7*  PLT 244 199   Cardiac Enzymes: No results for input(s): CKTOTAL, CKMB, CKMBINDEX, TROPONINI in the last 168 hours.  Studies: Ct Abdomen Pelvis W Contrast  06/30/2015  CLINICAL DATA:  Lower abdominal pain radiating from LEFT lower quadrant to RIGHT lower quadrant for 4 days. Rectal pressure. EXAM: CT ABDOMEN AND PELVIS WITH CONTRAST TECHNIQUE: Multidetector CT imaging of the abdomen and pelvis was performed using the standard protocol following bolus administration of intravenous  contrast. CONTRAST:  100mL ISOVUE-300 IOPAMIDOL (ISOVUE-300) INJECTION 61% COMPARISON:  [facility] CT [facility] FINDINGS: Lower chest: Large hiatal hernia with a significant percentage of the stomach in the chest. No evidence for obstruction of the distal esophagus or gastric outlet compromise.  Hepatobiliary: No masses or other significant abnormality. Status post cholecystectomy. The new new Pancreas: No mass, inflammatory changes, or other significant abnormality. Spleen: Within normal limits in size and appearance. Adrenals/Urinary Tract: No masses identified. No evidence of hydronephrosis. 2 x 3 mm nonobstructing renal calculus, LEFT lower pole. Stomach/Bowel: No evidence of obstruction, or abnormal fluid collection. Sigmoid diverticulosis, with indistinct surrounding soft tissues, and stranding of the pericolonic fat, consistent with sigmoid diverticulitis. No visible abscess. No free fluid or free air. Unremarkable appendix. Vascular/Lymphatic: No pathologically enlarged lymph nodes. No evidence of abdominal aortic aneurysm. 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 Reproductive: No mass or other significant abnormality. Other: None. Musculoskeletal:  No suspicious bone lesions identified. IMPRESSION: Sigmoid diverticulitis. No frank perforation, abscess, or free fluid. No bowel obstruction. 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 Nonobstructing LEFT renal calculus, 2 x 3 mm. Electronically Signed   By: Elsie StainJohn T Curnes M.D.   On: 06/30/2015 15:46    Scheduled Meds: . ciprofloxacin  400 mg Intravenous Q12H  . citalopram  20 mg Oral QHS  . fluticasone  2 spray Each Nare Daily  . levothyroxine  50 mcg Oral QAC breakfast  . LORazepam  1 mg Oral BID  . metronidazole  500 mg Intravenous Q8H  . rosuvastatin  5 mg Oral Daily  . warfarin  6 mg Oral ONCE-1800  . Warfarin - Pharmacist Dosing Inpatient   Does not apply q1800   Continuous Infusions: . sodium chloride 100 mL/hr at 07/01/15 0546       Time spent: 20 min    [facility],GAGAN S  [facility] Pager [phone]. If 7PM-7AM, please contact night-coverage at [email or web address], Office  [phone]  password  [facility] 07/01/2015, 1:53 PM

## 2015-07-01 NOTE — Care Management Obs Status (Signed)
MEDICARE OBSERVATION STATUS NOTIFICATION   Patient Details  Name: Sherry Levine MRN: [id number] Date of Birth: 1945-03-08   Medicare Observation Status Notification Given:  Yes    Bartholome BillCLEMENTS, Devyn Sheerin H, RN 07/01/2015, 2:57 PM

## 2015-07-02 ENCOUNTER — Observation Stay (HOSPITAL_COMMUNITY): Payer: Medicare Other

## 2015-07-02 DIAGNOSIS — K5792 Diverticulitis of intestine, part unspecified, without perforation or abscess without bleeding: Secondary | ICD-10-CM | POA: Diagnosis not present

## 2015-07-02 LAB — URINE CULTURE

## 2015-07-02 LAB — BASIC METABOLIC PANEL
Anion gap: 4 — ABNORMAL LOW (ref 5–15)
BUN: 8 mg/dL (ref 6–20)
CALCIUM: 8.7 mg/dL — AB (ref 8.9–10.3)
CO2: 25 mmol/L (ref 22–32)
CREATININE: 0.67 mg/dL (ref 0.44–1.00)
Chloride: 110 mmol/L (ref 101–111)
GFR calc non Af Amer: >60 mL/min (ref >60)
Glucose, Bld: 87 mg/dL (ref 65–99)
Potassium: 3.8 mmol/L (ref 3.5–5.1)
SODIUM: 139 mmol/L (ref 135–145)

## 2015-07-02 LAB — CBC
HCT: 34.2 % — ABNORMAL LOW (ref 36.0–46.0)
Hemoglobin: 11 g/dL — ABNORMAL LOW (ref 12.0–15.0)
MCH: 32.1 pg (ref 26.0–34.0)
MCHC: 32.2 g/dL (ref 30.0–36.0)
MCV: 99.7 fL (ref 78.0–100.0)
PLATELETS: 187 10*3/uL (ref 150–400)
RBC: 3.43 MIL/uL — ABNORMAL LOW (ref 3.87–5.11)
RDW: 12.7 % (ref 11.5–15.5)
WBC: 9.1 10*3/uL (ref 4.0–10.5)

## 2015-07-02 LAB — PROTIME-INR
INR: 1.89 — AB (ref 0.00–1.49)
PROTHROMBIN TIME: 20.9 s — AB (ref 11.6–15.2)

## 2015-07-02 MED ORDER — IOPAMIDOL (ISOVUE-300) INJECTION 61%
100.0000 mL | Freq: Once | INTRAVENOUS | Status: AC | PRN
  Administered 2015-07-02: 100 mL via INTRAVENOUS

## 2015-07-02 MED ORDER — POLYETHYLENE GLYCOL 3350 17 G PO PACK
17.0000 g | PACK | Freq: Three times a day (TID) | ORAL | Status: DC
  Administered 2015-07-02 – 2015-07-04 (×6): 17 g via ORAL
  Filled 2015-07-02 (×13): qty 1

## 2015-07-02 MED ORDER — WARFARIN SODIUM 4 MG PO TABS
4.0000 mg | ORAL_TABLET | Freq: Once | ORAL | Status: AC
  Administered 2015-07-02: 4 mg via ORAL
  Filled 2015-07-02: qty 1

## 2015-07-02 MED ORDER — DIATRIZOATE MEGLUMINE & SODIUM 66-10 % PO SOLN
15.0000 mL | Freq: Two times a day (BID) | ORAL | Status: DC | PRN
  Filled 2015-07-02: qty 30

## 2015-07-02 NOTE — Progress Notes (Signed)
ANTICOAGULATION CONSULT NOTE - Follow Up  Pharmacy Consult for warfarin Indication: Hx of DVT, PE; thrombophilia (prothrombin gene mutation)  Allergies  Allergen Reactions  . Lyrica [Pregabalin]     MAKES HER CRAZY  . Omnicef [Cefdinir]     CANT REMEMBER  . Penicillins Itching    Has patient had a PCN reaction causing immediate rash, facial/tongue/throat swelling, SOB or lightheadedness with hypotension: No Has patient had a PCN reaction causing severe rash involving mucus membranes or skin necrosis: No Has patient had a PCN reaction that required hospitalization No Has patient had a PCN reaction occurring within the last 10 years: No If all of the above answers are "NO", then may proceed with Cephalosporin use.  Marland Kitchen. Zithromax [Azithromycin] Nausea And Vomiting  . Celebrex [Celecoxib] Hives, Itching and Rash  . Codeine Itching and Rash    throat swells. States that she tolerates vicodin     Vital Signs: Temp: 97.9 F (36.6 C) (06/16 0303) Temp Source: Oral (06/16 0303) BP: 100/54 mmHg (06/16 0303) Pulse Rate: 66 (06/16 0303)  Labs:  Recent Labs  06/30/15 1350 07/01/15 0505 07/02/15 0453  HGB 13.9 11.7* 11.0*  HCT 40.6 36.1 34.2*  PLT 244 199 187  LABPROT 16.0* 17.2* 20.9*  INR 1.32 1.45 1.89*  CREATININE 0.72 0.76 0.67    Estimated Creatinine Clearance: 66.7 mL/min (by C-G formula based on Cr of 0.67).   Medical History: Past Medical History  Diagnosis Date  . Pulmonary embolism (HCC)   . DVT (deep venous thrombosis) (HCC)   . Thyroid disease   . Osteoarthritis   . Plantar fasciitis   . Allergy   . Anxiety   . Clotting disorder [facility](HCC)      Assessment: 70 y.o. Female with history of DVT/pulmonary embolism and thrombophilia (prothrombin gene mutation), hypothyroidism, osteoarthritis who came to the hospital with 1 day history of severe abdominal pain associated with passing of mucus and blood per rectum.  In the ED patient was found to have diverticulitis on  the CT abdomen.  Pt takes warfarin 4mg  daily except 6mg  on Monday and Friday.  Last dose 6/13@ 11am.   INR subtherapeutic (1.32) on admission.  Patient denies missing any warfarin doses and denies any recent dietary changes that would have increased her vitamin K intake.   See MAR for inpatient warfarin doses  Today, 07/02/2015  INR improving after warfarin booster doses.  Hgb drifting down.  Pltc WNL.  MD intentionally avoiding bridging with heparin / LMWH due to recent lower GI bleeding in setting of diverticulitis.  Diet: Clear liquids  Drug interactions: Cipro and Flagyl started 6/14 - these antibiotics often increase the INR and lower warfarin dosage requirements.  However, unexplained subtherapeutic INR prior to admission is noted.  Patient re-interviewed today, confirms prior to admission warfarin dosage, with no missed doses and no increased intake of high-vitamin K foods.  Patient [facility]  Goal of Therapy:  INR 2-3  1. Reduce warfarin to 4 mg PO today x 1. 2. Daily INR while inpatient 3. Emphasized to patient that at discharge she will need close outpatient monitoring of her INR for ongoing retitration of her warfarin dosage in setting of antibiotic therapy.  She expressed understanding of this.   Elie Goodyandy Namiyah Grantham, PharmD, BCPS Pager: [phone] 07/02/2015  8:56 AM

## 2015-07-02 NOTE — Progress Notes (Signed)
[facility]  PROGRESS NOTE  Sherry Levine ZOX:[id number] DOB: August 19, 1945 DOA: 06/30/2015 PCP: Kevin Fenton, MD    Brief HPI:  70 y.o. female, With history of DVT/pulmonary embolism, hypothyroidism, osteoarthritis who came to the hospital with 1 day history of severe abdominal pain associated with passing of mucus and blood per rectum. Patient denies fever but complains of being cold. Denies vomiting but had nausea. Patient complains of increased frequency of urination. No dysuria. Patient currently takes cortisone for DVT/PE. She had previous surgery of hysterectomy and cholecystectomy. In the ED patient was found to have diverticulitis on the CT abdomen. Started on Cipro and Flagyl.  Active Problems:   Acute diverticulitis   Diverticulitis   Assessment/Plan: 1. Diverticulitis-WBC is down to normal, she has been afebrile ,repeat CT scan abdomen done today again show stranding around sigmoid colon in the left lower quadrant continue Cipro and Flagyl. Continue Liquid diet. Will consult gastroenterology for further recommendations. 2. Dysuria- check urinalysis and urine culture. Patient is already on ciprofloxacin. 3. History of DVT/PE- continue Coumadin per pharmacy consultation. 4. Hypothyroidism- continue Synthroid, TSH 1.592.   DVT prophylaxis: Lovenox Code Status: Full code Family Communication: No family present at bedside Disposition Plan:  Home in 1-2 days   Consultants:  None  Procedures:  None  Antibiotics:  Ciprofloxacin  Flagyl  Subjective: Patient seen and examined, still complains of abdominal pain with pressure in lower abdomen.   Objective: Filed Vitals:   07/01/15 1359 07/01/15 2054 07/02/15 0303 07/02/15 1355  BP: 128/70 127/69 100/54 142/74  Pulse: 79 68 66 66  Temp: 98.7 F (37.1 C) 98.3 F (36.8 C) 97.9 F (36.6 C) 98.4 F (36.9 C)  TempSrc: Oral Oral Oral Oral  Resp: SpO2: 99% 97% 98% 99%    Intake/Output Summary  (Last 24 hours) at 07/02/15 1528 Last data filed at 07/02/15 0900  Gross per 24 hour  Intake 2926.66 ml  Output    300 ml  Net 2626.66 ml   There were no vitals filed for this visit.  Examination:  General exam: Appears calm and comfortable  Respiratory system: Clear to auscultation. Respiratory effort normal. Cardiovascular system: S1 & S2 heard, RRR. No JVD, murm  Abdomen - mild tenderness in left lower quadrant. No organomegaly or masses felt. Normal bowel sounds heard. Central nervous system: Alert and oriented. No focal neurological deficits. Extremities: Symmetric 5 x 5 power. Skin: No rashes, lesions or ulcers Psychiatry: Judgement and insight appear normal. Mood & affect appropriate.    Data Reviewed: I have personally reviewed following labs and imaging studies Basic Metabolic Panel:  Recent Labs Lab 06/30/15 1350 07/01/15 0505 07/02/15 0453  NA 136 137 139  K 3.7 3.9 3.8  CL 104 107 110  CO2 GLUCOSE 101* 95 87  BUN CREATININE 0.72 0.76 0.67  CALCIUM 9.8 8.7* 8.7*   Liver Function Tests:  Recent Labs Lab 06/30/15 1350 07/01/15 0505  AST 25 18  ALT 28 22  ALKPHOS 32* 26*  BILITOT 1.2 0.8  PROT 8.0 6.7  ALBUMIN 4.1 3.2*   CBC:  Recent Labs Lab 06/30/15 1350 07/01/15 0505 07/02/15 0453  WBC 14.2* 13.2* 9.1  HGB 13.9 11.7* 11.0*  HCT 40.6 36.1 34.2*  MCV 96.7 101.7* 99.7  PLT 244 199 187   Cardiac Enzymes: No results for input(s): CKTOTAL, CKMB, CKMBINDEX, TROPONINI in the last 168 hours.  Studies: Ct Abdomen Pelvis W Contrast  07/02/2015  CLINICAL DATA:  Diverticulitis and not feeling better. EXAM: CT ABDOMEN AND PELVIS WITH CONTRAST TECHNIQUE: Multidetector CT imaging of the abdomen and pelvis was performed using the standard protocol following bolus administration of intravenous contrast. CONTRAST:  ISOVUE-300 IOPAMIDOL (ISOVUE-300) INJECTION 61% COMPARISON:  CT 06/30/2015 FINDINGS: Lower chest: 6 mm nodule at the  left lung base on sequence 4, image 37 is unchanged from CT dated 01/23/2012 and likely benign. Bibasilar chest densities most likely represent volume loss and atelectasis, particularly along the large hiatal hernia. Trace left pleural fluid. Hepatobiliary: Gallbladder has been removed. Minimal intrahepatic biliary dilatation is unchanged. No acute abnormality to the liver. Main portal venous system is patent. Pancreas: Normal appearance of the pancreas without inflammation or duct dilatation. Spleen: Normal appearance of spleen without enlargement. Adrenals/Urinary Tract: Normal adrenal glands. 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 This aneurysm measures approximately 0.9 cm and stable. No acute abnormality to the kidneys and negative for hydronephrosis. Normal appearance of the urinary bladder. Nonobstructive stone in left kidney lower pole. Small nonobstructive stone in the right kidney upper pole. Stomach/Bowel: Again noted is mild wall thickening involving the sigmoid colon with surrounding inflammatory changes. In addition, there is soft tissue thickening along the left side of the sigmoid colon which is probably involving ovarian tissue. Small pocket of gas between the left ovary and the sigmoid colon on sequence 2 image 65 appears new or increased. Focal wall thickening at the hepatic flexure of the colon on sequence 2, image 25 and image 28. These areas of colonic wall thickening near the hepatic flexure persist on the delayed images and may not be associated with peristalsis. Normal appearance of the appendix. There is a large hiatal hernia. No gross abnormality to the duodenum or small bowel. Vascular/Lymphatic: Atherosclerotic calcifications in the aorta and iliac arteries. No aortic aneurysm. No suspicious lymphadenopathy in the abdomen or pelvis. Reproductive: The uterus is surgically absent. However, there appears to be residual ovarian  tissue. Inflammation involving the presumed left ovary. There is no significant gas within the ovarian tissue. Other: No significant free fluid. Increased edema or fat stranding in the left lower quadrant on sequence 2, image 58. Small pockets of gas between the sigmoid colon and left ovary as described. Otherwise, no evidence for free air. Musculoskeletal: Disc space narrowing and at L3-L4. Extensive facet arthropathy in the lower lumbar spine. No acute bone abnormality. IMPRESSION: Persistent inflammatory changes centered around the sigmoid colon and likely representing acute diverticulitis. There continues to be inflammatory changes along the left side of the sigmoid colon associated with the left ovary. Tiny pockets of gas between the sigmoid colon and left ovary may be new but there is no significant abscess collection in this area. There is slightly increased edema or stranding in the left lower quadrant. Segmental areas of colonic wall thickening involving the right colon and hepatic flexure. Findings could be associated with peristalsis but cannot exclude an inflammatory or neoplastic process. Recommend follow-up colonoscopy or correlation with prior colonoscopy. 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 Large hiatal hernia with bibasilar atelectasis. Nonobstructive kidney stones. Electronically Signed   By: Richarda Overlie M.D.   On: 07/02/2015 12:36   Ct Abdomen Pelvis W Contrast  06/30/2015  CLINICAL DATA:  Lower abdominal pain radiating from LEFT lower quadrant to RIGHT lower quadrant for 4 days. Rectal pressure. EXAM: CT ABDOMEN AND PELVIS WITH CONTRAST TECHNIQUE: Multidetector CT imaging  of the abdomen and pelvis was performed using the standard protocol following bolus administration of intravenous contrast. CONTRAST:  100mL ISOVUE-300 IOPAMIDOL (ISOVUE-300) INJECTION 61% COMPARISON:  [facility] CT [facility] FINDINGS: Lower chest: Large hiatal hernia with  a significant percentage of the stomach in the chest. No evidence for obstruction of the distal esophagus or gastric outlet compromise. Hepatobiliary: No masses or other significant abnormality. Status post cholecystectomy. The new new Pancreas: No mass, inflammatory changes, or other significant abnormality. Spleen: Within normal limits in size and appearance. Adrenals/Urinary Tract: No masses identified. No evidence of hydronephrosis. 2 x 3 mm nonobstructing renal calculus, LEFT lower pole. Stomach/Bowel: No evidence of obstruction, or abnormal fluid collection. Sigmoid diverticulosis, with indistinct surrounding soft tissues, and stranding of the pericolonic fat, consistent with sigmoid diverticulitis. No visible abscess. No free fluid or free air. Unremarkable appendix. Vascular/Lymphatic: No pathologically enlarged lymph nodes. No evidence of abdominal aortic aneurysm. 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 Reproductive: No mass or other significant abnormality. Other: None. Musculoskeletal:  No suspicious bone lesions identified. IMPRESSION: Sigmoid diverticulitis. No frank perforation, abscess, or free fluid. No bowel obstruction. 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 Nonobstructing LEFT renal calculus, 2 x 3 mm. Electronically Signed   By: Elsie StainJohn T Curnes M.D.   On: 06/30/2015 15:46    Scheduled Meds: . ciprofloxacin  400 mg Intravenous Q12H  . citalopram  20 mg Oral QHS  . fluticasone  2 spray Each Nare Daily  . levothyroxine  50 mcg Oral QAC breakfast  . LORazepam  1 mg Oral BID  . metronidazole  500 mg Intravenous Q8H  . rosuvastatin  5 mg Oral Daily  . warfarin  4 mg Oral ONCE-1800  . Warfarin - Pharmacist Dosing Inpatient   Does not apply q1800   Continuous Infusions: . sodium chloride 1,000 mL (07/02/15 0735)       Time spent: 20 min    [facility],Tanairi Cypert S  [facility] Pager [phone]. If 7PM-7AM, please contact night-coverage at [email or web address], Office  [phone]  password [email or web address] 07/02/2015, 3:28 PM  LOS: 0 days

## 2015-07-02 NOTE — Consult Note (Signed)
[facility] CONSULT Reason for consult: Diverticulitis Referring Physician: [facility] hospitalist. PCP: Dr. Wendi Snipes. Primary G.I.: unassigned  Sherry Levine is an 70 y.o. female.  HPI:  she is admitted with her 1st episode of diverticulitis. She is chronically constipated but is never had diverticulitis before. She had a colonoscopy about 10 years ago at the [facility] clinic that is not aware she had diverticuli are not and was not told that she needed any further colonoscopy. She states that she has not had a bowel movement for approximately 4 days. She had one day history of severe abdominal pain with passing bloody mucus and had gone 3 or 4 days without bowel movement. She came in to the emergency room and CT scan confirmed diverticulitis. At this point she has been on Cipro on Flagyl and has received these medicines for one and a half days and is still been having pain so the CT scan was repeated. The 2nd CT showed persistent inflammatory changes around the sigmoid colon without clear abscess and no free air or signs of perforation. The patient states that she is not any better but not really any worse either. Her WBC was 14.2 and is come down today to 9.1. She has remained afebrile. 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 She reports that she has a clotting disorder and that a hematologists is told her she needs to remain on anticoagulation. She has had previous hysterectomy and is not sure about ovaries.  Past Medical History  Diagnosis Date  . Pulmonary embolism ([place])   . DVT (deep venous thrombosis) ([place])   . Thyroid disease   . Osteoarthritis   . Plantar fasciitis   . Allergy   . Anxiety   . Clotting disorder [facility])     Past Surgical History  Procedure Laterality Date  . Abdominal hysterectomy    . Cervical fusion    . Knee arthroscopy with lateral menisectomy      right knee  . Carpel tunnel      right hand   . Cholecystectomy  2015    Family History  Problem Relation Age of Onset  . Stroke Mother   . Heart disease Mother   . Stroke Father   . Heart disease Father     Social History:  reports that she has never smoked. She does not have any smokeless tobacco history on file. She reports that she does not drink alcohol or use illicit drugs.  Allergies:  Allergies  Allergen Reactions  . Lyrica [Pregabalin]     MAKES HER CRAZY  . Omnicef [Cefdinir]     CANT REMEMBER  . Penicillins Itching    Has patient had a PCN reaction causing immediate rash, facial/tongue/throat swelling, SOB or lightheadedness with hypotension: No Has patient had a PCN reaction causing severe rash involving mucus membranes or skin necrosis: No Has patient had a PCN reaction that required hospitalization No Has patient had a PCN reaction occurring within the last 10 years: No If all of the above answers are "NO", then may proceed with Cephalosporin use.  Marland Kitchen Zithromax [Azithromycin] Nausea And Vomiting  . Celebrex [Celecoxib] Hives, Itching and Rash  . Codeine Itching and Rash    throat swells. States that she tolerates vicodin    Medications; Prior to Admission medications   Medication Sig Start Date End Date Taking? Authorizing Provider  cetirizine (ZYRTEC) 10 MG tablet TAKE 1 TABLET BY MOUTH EVERY DAY  Yes Lysbeth Penner, FNP  citalopram (CELEXA) 20 MG tablet Take 20 mg by mouth at bedtime.    Yes Historical Provider, MD  ferrous fumarate (HEMOCYTE - 106 MG FE) 325 (106 Fe) MG TABS tablet Take 1 tablet by mouth daily.    Yes Historical Provider, MD  fluticasone (FLONASE) 50 MCG/ACT nasal spray Place into both nostrils daily.   Yes Historical Provider, MD  levothyroxine (SYNTHROID, LEVOTHROID) 50 MCG tablet TAKE 1 TABLET BY MOUTH EVERY DAY 01/14/13  Yes Chipper Herb, MD  LORazepam (ATIVAN) 1 MG tablet Take 1 tablet (1 mg total) by mouth at bedtime as needed for anxiety. Patient taking differently: Take 1  mg by mouth 2 (two) times daily.  07/16/12  Yes Mary-Margaret Hassell Done, FNP  pantoprazole (PROTONIX) 40 MG tablet Take 40 mg by mouth daily.   Yes Historical Provider, MD  rosuvastatin (CRESTOR) 5 MG tablet Take 5 mg by mouth daily.   Yes Historical Provider, MD  warfarin (COUMADIN) 4 MG tablet Take 1 tablet (4 mg total) by mouth daily. Alternating '4mg'$ /'6mg'$  daily Patient taking differently: Take 4-6 mg by mouth daily. Takes 4 mg everyday except Monday and Friday she takes 6 mg 04/04/12  Yes Mary-Margaret Hassell Done, FNP   . ciprofloxacin  400 mg Intravenous Q12H  . citalopram  20 mg Oral QHS  . fluticasone  2 spray Each Nare Daily  . levothyroxine  50 mcg Oral QAC breakfast  . LORazepam  1 mg Oral BID  . metronidazole  500 mg Intravenous Q8H  . rosuvastatin  5 mg Oral Daily  . warfarin  4 mg Oral ONCE-1800  . Warfarin - Pharmacist Dosing Inpatient   Does not apply q1800   PRN Meds acetaminophen, diatrizoate meglumine-sodium, HYDROmorphone (DILAUDID) injection, ondansetron **OR** ondansetron (ZOFRAN) IV, prochlorperazine Results for orders placed or performed during the hospital encounter of 06/30/15 (from the past 48 hour(s))  TSH     Status: None   Collection Time: 06/30/15  6:42 PM  Result Value Ref Range   TSH 1.592 0.350 - 4.500 uIU/mL  CBC     Status: Abnormal   Collection Time: 07/01/15  5:05 AM  Result Value Ref Range   WBC 13.2 (H) 4.0 - 10.5 K/uL   RBC 3.55 (L) 3.87 - 5.11 MIL/uL   Hemoglobin 11.7 (L) 12.0 - 15.0 g/dL   HCT 36.1 36.0 - 46.0 %   MCV 101.7 (H) 78.0 - 100.0 fL   MCH 33.0 26.0 - 34.0 pg   MCHC 32.4 30.0 - 36.0 g/dL   RDW 13.2 11.5 - 15.5 %   Platelets 199 150 - 400 K/uL  Comprehensive metabolic panel     Status: Abnormal   Collection Time: 07/01/15  5:05 AM  Result Value Ref Range   Sodium 137 135 - 145 mmol/L   Potassium 3.9 3.5 - 5.1 mmol/L   Chloride 107 101 - 111 mmol/L   CO2 25 22 - 32 mmol/L   Glucose, Bld 95 65 - 99 mg/dL   BUN 11 6 - 20 mg/dL    Creatinine, Ser 0.76 0.44 - 1.00 mg/dL   Calcium 8.7 (L) 8.9 - 10.3 mg/dL   Total Protein 6.7 6.5 - 8.1 g/dL   Albumin 3.2 (L) 3.5 - 5.0 g/dL   AST 18 15 - 41 U/L   ALT 22 14 - 54 U/L   Alkaline Phosphatase 26 (L) 38 - 126 U/L   Total Bilirubin 0.8 0.3 - 1.2 mg/dL   GFR calc non  Af Amer >60 >60 mL/min   GFR calc Af Amer >60 >60 mL/min    Comment: (NOTE) The eGFR has been calculated using the CKD EPI equation. This calculation has not been validated in all clinical situations. eGFR's persistently <60 mL/min signify possible Chronic Kidney Disease.    Anion gap 5 5 - 15  Protime-INR     Status: Abnormal   Collection Time: 07/01/15  5:05 AM  Result Value Ref Range   Prothrombin Time 17.2 (H) 11.6 - 15.2 seconds   INR 1.45 0.00 - 1.49  Urinalysis, Routine w reflex microscopic (not at [facility])     Status: Abnormal   Collection Time: 07/01/15  4:05 PM  Result Value Ref Range   Color, Urine YELLOW YELLOW   APPearance CLEAR CLEAR   Specific Gravity, Urine 1.008 1.005 - 1.030   pH 6.0 5.0 - 8.0   Glucose, UA NEGATIVE NEGATIVE mg/dL   Hgb urine dipstick MODERATE (A) NEGATIVE   Bilirubin Urine NEGATIVE NEGATIVE   Ketones, ur NEGATIVE NEGATIVE mg/dL   Protein, ur NEGATIVE NEGATIVE mg/dL   Nitrite NEGATIVE NEGATIVE   Leukocytes, UA TRACE (A) NEGATIVE  Urine culture     Status: Abnormal   Collection Time: 07/01/15  4:05 PM  Result Value Ref Range   Specimen Description URINE, RANDOM    Special Requests NONE    Culture MULTIPLE SPECIES PRESENT, SUGGEST RECOLLECTION (A)    Report Status 07/02/2015 FINAL   Urine microscopic-add on     Status: Abnormal   Collection Time: 07/01/15  4:05 PM  Result Value Ref Range   Squamous Epithelial / LPF 0-5 (A) NONE SEEN   WBC, UA 6-30 0 - 5 WBC/hpf   RBC / HPF 0-5 0 - 5 RBC/hpf   Bacteria, UA RARE (A) NONE SEEN  Protime-INR     Status: Abnormal   Collection Time: 07/02/15  4:53 AM  Result Value Ref Range   Prothrombin Time 20.9 (H) 11.6 - 15.2  seconds   INR 1.89 (H) 0.00 - 1.49  CBC     Status: Abnormal   Collection Time: 07/02/15  4:53 AM  Result Value Ref Range   WBC 9.1 4.0 - 10.5 K/uL   RBC 3.43 (L) 3.87 - 5.11 MIL/uL   Hemoglobin 11.0 (L) 12.0 - 15.0 g/dL   HCT 34.2 (L) 36.0 - 46.0 %   MCV 99.7 78.0 - 100.0 fL   MCH 32.1 26.0 - 34.0 pg   MCHC 32.2 30.0 - 36.0 g/dL   RDW 12.7 11.5 - 15.5 %   Platelets 187 150 - 400 K/uL  Basic metabolic panel     Status: Abnormal   Collection Time: 07/02/15  4:53 AM  Result Value Ref Range   Sodium 139 135 - 145 mmol/L   Potassium 3.8 3.5 - 5.1 mmol/L   Chloride 110 101 - 111 mmol/L   CO2 25 22 - 32 mmol/L   Glucose, Bld 87 65 - 99 mg/dL   BUN 8 6 - 20 mg/dL   Creatinine, Ser 0.67 0.44 - 1.00 mg/dL   Calcium 8.7 (L) 8.9 - 10.3 mg/dL   GFR calc non Af Amer >60 >60 mL/min   GFR calc Af Amer >60 >60 mL/min    Comment: (NOTE) The eGFR has been calculated using the CKD EPI equation. This calculation has not been validated in all clinical situations. eGFR's persistently <60 mL/min signify possible Chronic Kidney Disease.    Anion gap 4 (L) 5 - 15  Ct Abdomen Pelvis W Contrast  07/02/2015  CLINICAL DATA:  Diverticulitis and not feeling better. EXAM: CT ABDOMEN AND PELVIS WITH CONTRAST TECHNIQUE: Multidetector CT imaging of the abdomen and pelvis was performed using the standard protocol following bolus administration of intravenous contrast. CONTRAST:  136m ISOVUE-300 IOPAMIDOL (ISOVUE-300) INJECTION 61% COMPARISON:  CT 06/30/2015 FINDINGS: Lower chest: 6 mm nodule at the left lung base on sequence 4, image 37 is unchanged from CT dated 01/23/2012 and likely benign. Bibasilar chest densities most likely represent volume loss and atelectasis, particularly along the large hiatal hernia. Trace left pleural fluid. Hepatobiliary: Gallbladder has been removed. Minimal intrahepatic biliary dilatation is unchanged. No acute abnormality to the liver. Main portal venous system is patent.  Pancreas: Normal appearance of the pancreas without inflammation or duct dilatation. Spleen: Normal appearance of spleen without enlargement. Adrenals/Urinary Tract: Normal adrenal glands. 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 This aneurysm measures approximately 0.9 cm and stable. No acute abnormality to the kidneys and negative for hydronephrosis. Normal appearance of the urinary bladder. Nonobstructive stone in left kidney lower pole. Small nonobstructive stone in the right kidney upper pole. Stomach/Bowel: Again noted is mild wall thickening involving the sigmoid colon with surrounding inflammatory changes. In addition, there is soft tissue thickening along the left side of the sigmoid colon which is probably involving ovarian tissue. Small pocket of gas between the left ovary and the sigmoid colon on sequence 2 image 65 appears new or increased. Focal wall thickening at the hepatic flexure of the colon on sequence 2, image 25 and image 28. These areas of colonic wall thickening near the hepatic flexure persist on the delayed images and may not be associated with peristalsis. Normal appearance of the appendix. There is a large hiatal hernia. No gross abnormality to the duodenum or small bowel. Vascular/Lymphatic: Atherosclerotic calcifications in the aorta and iliac arteries. No aortic aneurysm. No suspicious lymphadenopathy in the abdomen or pelvis. Reproductive: The uterus is surgically absent. However, there appears to be residual ovarian tissue. Inflammation involving the presumed left ovary. There is no significant gas within the ovarian tissue. Other: No significant free fluid. Increased edema or fat stranding in the left lower quadrant on sequence 2, image 58. Small pockets of gas between the sigmoid colon and left ovary as described. Otherwise, no evidence for free air. Musculoskeletal: Disc space narrowing and at L3-L4. Extensive facet  arthropathy in the lower lumbar spine. No acute bone abnormality. IMPRESSION: Persistent inflammatory changes centered around the sigmoid colon and likely representing acute diverticulitis. There continues to be inflammatory changes along the left side of the sigmoid colon associated with the left ovary. Tiny pockets of gas between the sigmoid colon and left ovary may be new but there is no significant abscess collection in this area. There is slightly increased edema or stranding in the left lower quadrant. Segmental areas of colonic wall thickening involving the right colon and hepatic flexure. Findings could be associated with peristalsis but cannot exclude an inflammatory or neoplastic process. Recommend follow-up colonoscopy or correlation with prior colonoscopy. 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 Large hiatal hernia with bibasilar atelectasis. Nonobstructive kidney stones. Electronically Signed   By: AMarkus DaftM.D.   On: 07/02/2015 12:36   ROS: remarkable for esophageal reflux and chronic constipation. Has a history of pulmonary embolus and DVT no chronic cardiac problems.             Blood  pressure 142/74, pulse 66, temperature 98.4 F (36.9 C), temperature source Oral, resp. rate 18, last menstrual period 04/05/1982, SpO2 99 %.  Physical exam:   General-- pleasant white female in no acute distress ENT-- nonicteric Neck-- supple no lymphadenopathy Heart-- regular rate and rhythm without murmurs are gallops Lungs-- clear Abdomen-- slightly distended few bowel sounds present moderate tenderness and lower abdomen with guarding Psych-- alert and oriented   Assessment: 1. Acute diverticulitis. The CT does show some questionable progression but no clear free air clear abscess. The patient has only been on antibiotics a day and half so it's not entirely surprising that she is not had a demonstrable response as of yet. Fortunately her WBC has declined to the normal  range after a day and half of antibiotics in this is encouraging. She is allergic to penicillin cephalosporins so we cannot use those medications. 2. Hypercoagulable state with history of pulmonary embolus and DVT. Requires chronic anticoagulation  Plan: 1. Would keep her own clear liquids only for now. 2. Would continue the current antibiotics and follow her clinical course. If it appears that she is worsening over the next several days rather than improving he may need to repeat CT scan to look for signs and abscess or perforation that may require surgery. Hopefully after a few more days she will respond to her current antibiotics. 3. Should be okay to resume anticoagulation. Dr. Amedeo Plenty will be covering this weekend and will check on her tomorrow.   Jerardo Costabile JR,Lusero Nordlund L 07/02/2015, 4:13 PM   This note was created using voice recognition software and minor errors may Have occurred unintentionally. Pager: [phone] If no answer or after hours call [phone]

## 2015-07-02 NOTE — Progress Notes (Signed)
ANTICOAGULATION CONSULT NOTE - Follow Up  Pharmacy Consult for warfarin Indication: Hx of DVT, PE; thrombophilia (prothrombin gene mutation)  Allergies  Allergen Reactions  . Lyrica [Pregabalin]     MAKES HER CRAZY  . Omnicef [Cefdinir]     CANT REMEMBER  . Penicillins Itching    Has patient had a PCN reaction causing immediate rash, facial/tongue/throat swelling, SOB or lightheadedness with hypotension: No Has patient had a PCN reaction causing severe rash involving mucus membranes or skin necrosis: No Has patient had a PCN reaction that required hospitalization No Has patient had a PCN reaction occurring within the last 10 years: No If all of the above answers are "NO", then may proceed with Cephalosporin use.  Marland Kitchen. Zithromax [Azithromycin] Nausea And Vomiting  . Celebrex [Celecoxib] Hives, Itching and Rash  . Codeine Itching and Rash    throat swells. States that she tolerates vicodin     Vital Signs: Temp: 97.9 F (36.6 C) (06/16 0303) Temp Source: Oral (06/16 0303) BP: 100/54 mmHg (06/16 0303) Pulse Rate: 66 (06/16 0303)  Labs:  Recent Labs  06/30/15 1350 07/01/15 0505 07/02/15 0453  HGB 13.9 11.7* 11.0*  HCT 40.6 36.1 34.2*  PLT 244 199 187  LABPROT 16.0* 17.2* 20.9*  INR 1.32 1.45 1.89*  CREATININE 0.72 0.76 0.67    Estimated Creatinine Clearance: 66.7 mL/min (by C-G formula based on Cr of 0.67).   Medical History: Past Medical History  Diagnosis Date  . Pulmonary embolism ([facility])   . DVT (deep venous thrombosis) ([facility])   . Thyroid disease   . Osteoarthritis   . Plantar fasciitis   . Allergy   . Anxiety   . Clotting disorder [facility](HCC)      Assessment: 70 y.o. Female with history of DVT/pulmonary embolism and thrombophilia (prothrombin gene mutation), hypothyroidism, osteoarthritis who came to the hospital with 1 day history of severe abdominal pain associated with passing of mucus and blood per rectum.  In the ED patient was found to have diverticulitis on  the CT abdomen.  Pt takes warfarin 4mg  daily except 6mg  on Monday and Friday.  Last dose 6/13@ 11am.   INR subtherapeutic (1.32) on admission.  Patient denies missing any warfarin doses and denies any recent dietary changes that would have increased her vitamin K intake.   See MAR for inpatient warfarin doses  Today, 07/02/2015  INR improving after warfarin booster doses.  MD intentionally avoiding bridging with heparin / LMWH due to recent lower GI bleeding in setting of diverticulitis.  Diet: Clear liquids  Drug interactions: Cipro and Flagyl started 6/14 - these antibiotics often increase the INR and lower warfarin dosage requirements.  However, unexplained subtherapeutic INR prior to admission is noted.  Patient re-interviewed today, confirms prior to admission warfarin dosage, with no missed doses and no increased intake of high-vitamin K foods.  Patient [facility]  Goal of Therapy:  INR 2-3  1. Reduce warfarin to 4 mg PO today x 1. 2. Daily INR while inpatient 3. Emphasized to patient that at discharge she will need close outpatient monitoring of her INR for ongoing retitration of her warfarin dosage in setting of antibiotic therapy.  She expressed understanding of this.   Elie Goodyandy Sharleen Szczesny, PharmD, BCPS Pager: [phone] 07/02/2015  8:44 AM

## 2015-07-03 DIAGNOSIS — K5792 Diverticulitis of intestine, part unspecified, without perforation or abscess without bleeding: Secondary | ICD-10-CM | POA: Diagnosis not present

## 2015-07-03 LAB — BASIC METABOLIC PANEL
ANION GAP: 4 — AB (ref 5–15)
BUN: 5 mg/dL — AB (ref 6–20)
CHLORIDE: 112 mmol/L — AB (ref 101–111)
CO2: 25 mmol/L (ref 22–32)
Calcium: 8.6 mg/dL — ABNORMAL LOW (ref 8.9–10.3)
Creatinine, Ser: 0.61 mg/dL (ref 0.44–1.00)
GFR calc Af Amer: >60 mL/min (ref >60)
GFR calc non Af Amer: >60 mL/min (ref >60)
GLUCOSE: 96 mg/dL (ref 65–99)
POTASSIUM: 3.8 mmol/L (ref 3.5–5.1)
Sodium: 141 mmol/L (ref 135–145)

## 2015-07-03 LAB — CBC
HEMATOCRIT: 34.6 % — AB (ref 36.0–46.0)
HEMOGLOBIN: 11.4 g/dL — AB (ref 12.0–15.0)
MCH: 33.3 pg (ref 26.0–34.0)
MCHC: 32.9 g/dL (ref 30.0–36.0)
MCV: 101.2 fL — AB (ref 78.0–100.0)
Platelets: 216 10*3/uL (ref 150–400)
RBC: 3.42 MIL/uL — AB (ref 3.87–5.11)
RDW: 13.1 % (ref 11.5–15.5)
WBC: 6.7 10*3/uL (ref 4.0–10.5)

## 2015-07-03 LAB — PROTIME-INR
INR: 2.5 — ABNORMAL HIGH (ref 0.00–1.49)
Prothrombin Time: 25.9 seconds — ABNORMAL HIGH (ref 11.6–15.2)

## 2015-07-03 MED ORDER — WARFARIN SODIUM 2 MG PO TABS
2.0000 mg | ORAL_TABLET | Freq: Once | ORAL | Status: AC
  Administered 2015-07-03: 2 mg via ORAL
  Filled 2015-07-03: qty 1

## 2015-07-03 MED ORDER — SALINE SPRAY 0.65 % NA SOLN
1.0000 | NASAL | Status: DC | PRN
  Filled 2015-07-03: qty 44

## 2015-07-03 NOTE — Progress Notes (Signed)
[facility]  PROGRESS NOTE  Sherry Levine QIO:[id number] DOB: 01-Jul-1945 DOA: 06/30/2015 PCP: Kevin Fenton, MD    Brief HPI:  70 y.o. female, With history of DVT/pulmonary embolism, hypothyroidism, osteoarthritis who came to the hospital with 1 day history of severe abdominal pain associated with passing of mucus and blood per rectum. Patient denies fever but complains of being cold. Denies vomiting but had nausea. Patient complains of increased frequency of urination. No dysuria. Patient currently takes cortisone for DVT/PE. She had previous surgery of hysterectomy and cholecystectomy. In the ED patient was found to have diverticulitis on the CT abdomen. Started on Cipro and Flagyl.  Active Problems:   Acute diverticulitis   Diverticulitis   Assessment/Plan: 1. Diverticulitis-WBC is down to normal, she has been afebrile ,repeat CT scan abdomen done today again show stranding around sigmoid colon in the left lower quadrant continue Cipro and Flagyl. GI was consulted and recommend to continue with IV antibiotics.Diet has been advanced to full liquid diet. 2. Dysuria-  Urinalysis is negative and urine culture is pending at this time. Patient is already on ciprofloxacin. 3. History of DVT/PE- continue Coumadin per pharmacy consultation. 4. Hypothyroidism- continue Synthroid, TSH 1.592.   DVT prophylaxis: patient on warfarin Code Status: Full code Family Communication: discussed with husband at bedside Disposition Plan:  Home in 1-2 days   Consultants: GI  Procedures:  None  Antibiotics:  Ciprofloxacin  Flagyl  Subjective: Patient seen and examined, still complains of abdominal pain with pressure in lower abdomen. Has been seen by GI  Objective: Filed Vitals:   07/02/15 1355 07/02/15 1700 07/02/15 2200 07/03/15 0642  BP: 142/74  123/75 138/76  Pulse: 66  85   Temp: 98.4 F (36.9 C)  98.5 F (36.9 C) 98.3 F (36.8 C)  TempSrc: Oral  Oral Oral  Resp: Height:   (1.6 m)    Weight:  82.555 kg (182 lb)    SpO2: 99%  100% 100%    Intake/Output Summary (Last 24 hours) at 07/03/15 1219 Last data filed at 07/03/15 1206  Gross per 24 hour  Intake    380 ml  Output      0 ml  Net    380 ml   Filed Weights   07/02/15 1700  Weight: 82.555 kg (182 lb)    Examination:  General exam: Appears calm and comfortable  Respiratory system: Clear to auscultation. Respiratory effort normal. Cardiovascular system: S1 & S2 heard, RRR. No JVD, murm  Abdomen - mild tenderness in left lower quadrant. No organomegaly or masses felt. Normal bowel sounds heard. Central nervous system: Alert and oriented. No focal neurological deficits. Extremities: Symmetric 5 x 5 power. Skin: No rashes, lesions or ulcers Psychiatry: Judgement and insight appear normal. Mood & affect appropriate.    Data Reviewed: I have personally reviewed following labs and imaging studies Basic Metabolic Panel:  Recent Labs Lab 06/30/15 1350 07/01/15 0505 07/02/15 0453 07/03/15 0538  NA 136 137 139 141  K 3.7 3.9 3.8 3.8  CL 104 107 110 112*  CO2 GLUCOSE 101* 95 87 96  BUN 5*  CREATININE 0.72 0.76 0.67 0.61  CALCIUM 9.8 8.7* 8.7* 8.6*   Liver Function Tests:  Recent Labs Lab 06/30/15 1350 07/01/15 0505  AST 25 18  ALT 28 22  ALKPHOS 32* 26*  BILITOT 1.2 0.8  PROT 8.0 6.7  ALBUMIN 4.1 3.2*   CBC:  Recent Labs Lab 06/30/15 1350 07/01/15 0505 07/02/15 0453 07/03/15 0538  WBC 14.2* 13.2* 9.1 6.7  HGB 13.9 11.7* 11.0* 11.4*  HCT 40.6 36.1 34.2* 34.6*  MCV 96.7 101.7* 99.7 101.2*  PLT 244 199 187 216   Cardiac Enzymes: No results for input(s): CKTOTAL, CKMB, CKMBINDEX, TROPONINI in the last 168 hours.  Studies: Ct Abdomen Pelvis W Contrast  07/02/2015  CLINICAL DATA:  Diverticulitis and not feeling better. EXAM: CT ABDOMEN AND PELVIS WITH CONTRAST TECHNIQUE: Multidetector CT imaging of the abdomen and pelvis was performed  using the standard protocol following bolus administration of intravenous contrast. CONTRAST:  100mL ISOVUE-300 IOPAMIDOL (ISOVUE-300) INJECTION 61% COMPARISON:  CT 06/30/2015 FINDINGS: Lower chest: 6 mm nodule at the left lung base on sequence 4, image 37 is unchanged from CT dated 01/23/2012 and likely benign. Bibasilar chest densities most likely represent volume loss and atelectasis, particularly along the large hiatal hernia. Trace left pleural fluid. Hepatobiliary: Gallbladder has been removed. Minimal intrahepatic biliary dilatation is unchanged. No acute abnormality to the liver. Main portal venous system is patent. Pancreas: Normal appearance of the pancreas without inflammation or duct dilatation. Spleen: Normal appearance of spleen without enlargement. Adrenals/Urinary Tract: Normal adrenal glands. 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 This aneurysm measures approximately 0.9 cm and stable. No acute abnormality to the kidneys and negative for hydronephrosis. Normal appearance of the urinary bladder. Nonobstructive stone in left kidney lower pole. Small nonobstructive stone in the right kidney upper pole. Stomach/Bowel: Again noted is mild wall thickening involving the sigmoid colon with surrounding inflammatory changes. In addition, there is soft tissue thickening along the left side of the sigmoid colon which is probably involving ovarian tissue. Small pocket of gas between the left ovary and the sigmoid colon on sequence 2 image 65 appears new or increased. Focal wall thickening at the hepatic flexure of the colon on sequence 2, image 25 and image 28. These areas of colonic wall thickening near the hepatic flexure persist on the delayed images and may not be associated with peristalsis. Normal appearance of the appendix. There is a large hiatal hernia. No gross abnormality to the duodenum or small bowel. Vascular/Lymphatic: Atherosclerotic  calcifications in the aorta and iliac arteries. No aortic aneurysm. No suspicious lymphadenopathy in the abdomen or pelvis. Reproductive: The uterus is surgically absent. However, there appears to be residual ovarian tissue. Inflammation involving the presumed left ovary. There is no significant gas within the ovarian tissue. Other: No significant free fluid. Increased edema or fat stranding in the left lower quadrant on sequence 2, image 58. Small pockets of gas between the sigmoid colon and left ovary as described. Otherwise, no evidence for free air. Musculoskeletal: Disc space narrowing and at L3-L4. Extensive facet arthropathy in the lower lumbar spine. No acute bone abnormality. IMPRESSION: Persistent inflammatory changes centered around the sigmoid colon and likely representing acute diverticulitis. There continues to be inflammatory changes along the left side of the sigmoid colon associated with the left ovary. Tiny pockets of gas between the sigmoid colon and left ovary may be new but there is no significant abscess collection in this area. There is slightly increased edema or stranding in the left lower quadrant. Segmental areas of colonic wall thickening involving the right colon and hepatic flexure. Findings could be associated with peristalsis but cannot exclude an inflammatory or neoplastic process. Recommend follow-up colonoscopy or correlation with prior colonoscopy. 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 Large hiatal hernia with bibasilar atelectasis. Nonobstructive kidney stones. Electronically Signed   By: Richarda Overlie M.D.   On: 07/02/2015 12:36    Scheduled Meds: . ciprofloxacin  400 mg Intravenous Q12H  . citalopram  20 mg Oral QHS  . fluticasone  2 spray Each Nare Daily  . levothyroxine  50 mcg Oral QAC breakfast  . LORazepam  1 mg Oral BID  . metronidazole  500 mg Intravenous Q8H  . polyethylene glycol  17 g Oral TID  . rosuvastatin  5 mg Oral Daily  . warfarin  2 mg  Oral ONCE-1800  . Warfarin - Pharmacist Dosing Inpatient   Does not apply q1800   Continuous Infusions: . sodium chloride 50 mL/hr at 07/03/15 1054       Time spent: 20 min    [facility]  [facility] Pager [phone]. If 7PM-7AM, please contact night-coverage at [email or web address], Office  [phone]  password [facility] 07/03/2015, 12:19 PM

## 2015-07-03 NOTE — Progress Notes (Signed)
ANTICOAGULATION CONSULT NOTE - Follow Up  Pharmacy Consult for warfarin Indication: Hx of DVT, PE; thrombophilia (prothrombin gene mutation)  Allergies  Allergen Reactions  . Lyrica [Pregabalin]     MAKES HER CRAZY  . Omnicef [Cefdinir]     CANT REMEMBER  . Penicillins Itching    Has patient had a PCN reaction causing immediate rash, facial/tongue/throat swelling, SOB or lightheadedness with hypotension: No Has patient had a PCN reaction causing severe rash involving mucus membranes or skin necrosis: No Has patient had a PCN reaction that required hospitalization No Has patient had a PCN reaction occurring within the last 10 years: No If all of the above answers are "NO", then may proceed with Cephalosporin use.  Marland Kitchen. Zithromax [Azithromycin] Nausea And Vomiting  . Celebrex [Celecoxib] Hives, Itching and Rash  . Codeine Itching and Rash    throat swells. States that she tolerates vicodin     Vital Signs: Temp: 98.3 F (36.8 C) (06/17 0642) Temp Source: Oral (06/17 0642) BP: 138/76 mmHg (06/17 0642)  Labs:  Recent Labs  07/01/15 0505 07/02/15 0453 07/03/15 0538  HGB 11.7* 11.0* 11.4*  HCT 36.1 34.2* 34.6*  PLT 199 187 216  LABPROT 17.2* 20.9* 25.9*  INR 1.45 1.89* 2.50*  CREATININE 0.76 0.67 0.61    Estimated Creatinine Clearance: 66.6 mL/min (by C-G formula based on Cr of 0.61).   Medical History: Past Medical History  Diagnosis Date  . Pulmonary embolism (HCC)   . DVT (deep venous thrombosis) (HCC)   . Thyroid disease   . Osteoarthritis   . Plantar fasciitis   . Allergy   . Anxiety   . Clotting disorder [facility](HCC)      Assessment: 70 y.o. Female with history of DVT/pulmonary embolism and thrombophilia (prothrombin gene mutation), hypothyroidism, osteoarthritis who came to the hospital with 1 day history of severe abdominal pain associated with passing of mucus and blood per rectum.  In the ED patient was found to have diverticulitis on the CT abdomen.  Pt takes  warfarin 4mg  daily except 6mg  on Monday and Friday.  Last dose 6/13@ 11am.   INR subtherapeutic (1.32) on admission.  Patient denies missing any warfarin doses and denies any recent dietary changes that would have increased her vitamin K intake.   See MAR for inpatient warfarin doses  Today, 07/03/2015  INR therapeutic but concerning rate of INR rise overnight.  CBC: Hgb drifting down/stable this am.  Pltc WNL.  MD intentionally avoiding bridging with heparin / LMWH due to recent lower GI bleeding in setting of diverticulitis.  Diet: Clear liquids  Drug interactions: Cipro and Flagyl started 6/14 - these antibiotics often increase the INR and lower warfarin dosage requirements.  However, unexplained subtherapeutic INR prior to admission is noted.  Patient re-interviewed 6/16, confirms prior to admission warfarin dosage, with no missed doses and no increased intake of high-vitamin K foods.  Patient states her outpatient [facility].  Goal of Therapy:  INR 2-3  1. Reduce warfarin to 2mg  PO today x 1 d/t INR rate of rise likely due to warfarin boosted doses PLUS drug interactions with antibiotics. 2. Daily INR while inpatient 3. Emphasized to patient that at discharge she will need close outpatient monitoring of her INR for ongoing retitration of her warfarin dosage in setting of antibiotic therapy.  She expressed understanding of this.   Juliette Alcideustin Tanasha Menees, PharmD, BCPS.   Pager: [phone] 07/03/2015 10:55 AM

## 2015-07-03 NOTE — Progress Notes (Signed)
[facility] Progress Note  Subjective: Pain, tenderness, modestly improving  Objective: Vital signs in last 24 hours: Temp:  [98.3 F (36.8 C)-98.5 F (36.9 C)] 98.3 F (36.8 C) (06/17 16100642) Pulse Rate:  [66-85] 85 (06/16 2200) Resp:  [18] 18 (06/17 0642) BP: (123-142)/(74-76) 138/76 mmHg (06/17 0642) SpO2:  [99 %-100 %] 100 % (06/17 0642) Weight:  [82.555 kg (182 lb)] 82.555 kg (182 lb) (06/16 1700) Weight change:    PE: Abdomen slightly distended, diffusely tender especially lower quadrants, no rebound  Lab Results: Results for orders placed or performed during the hospital encounter of 06/30/15 (from the past 24 hour(s))  Protime-INR     Status: Abnormal   Collection Time: 07/03/15  5:38 AM  Result Value Ref Range   Prothrombin Time 25.9 (H) 11.6 - 15.2 seconds   INR 2.50 (H) 0.00 - 1.49  CBC     Status: Abnormal   Collection Time: 07/03/15  5:38 AM  Result Value Ref Range   WBC 6.7 4.0 - 10.5 K/uL   RBC 3.42 (L) 3.87 - 5.11 MIL/uL   Hemoglobin 11.4 (L) 12.0 - 15.0 g/dL   HCT 96.034.6 (L) 45.436.0 - 09.846.0 %   MCV 101.2 (H) 78.0 - 100.0 fL   MCH 33.3 26.0 - 34.0 pg   MCHC 32.9 30.0 - 36.0 g/dL   RDW 11.913.1 14.711.5 - 82.915.5 %   Platelets 216 150 - 400 K/uL  Basic metabolic panel     Status: Abnormal   Collection Time: 07/03/15  5:38 AM  Result Value Ref Range   Sodium 141 135 - 145 mmol/L   Potassium 3.8 3.5 - 5.1 mmol/L   Chloride 112 (H) 101 - 111 mmol/L   CO2 25 22 - 32 mmol/L   Glucose, Bld 96 65 - 99 mg/dL   BUN 5 (L) 6 - 20 mg/dL   Creatinine, Ser 5.620.61 0.44 - 1.00 mg/dL   Calcium 8.6 (L) 8.9 - 10.3 mg/dL   GFR calc non Af Amer >60 >60 mL/min   GFR calc Af Amer >60 >60 mL/min   Anion gap 4 (L) 5 - 15    Studies/Results: Ct Abdomen Pelvis W Contrast  07/02/2015  CLINICAL DATA:  Diverticulitis and not feeling better. EXAM: CT ABDOMEN AND PELVIS WITH CONTRAST TECHNIQUE: Multidetector CT imaging of the abdomen and pelvis was performed using the standard protocol  following bolus administration of intravenous contrast. CONTRAST:  100mL ISOVUE-300 IOPAMIDOL (ISOVUE-300) INJECTION 61% COMPARISON:  CT 06/30/2015 FINDINGS: Lower chest: 6 mm nodule at the left lung base on sequence 4, image 37 is unchanged from CT dated 01/23/2012 and likely benign. Bibasilar chest densities most likely represent volume loss and atelectasis, particularly along the large hiatal hernia. Trace left pleural fluid. Hepatobiliary: Gallbladder has been removed. Minimal intrahepatic biliary dilatation is unchanged. No acute abnormality to the liver. Main portal venous system is patent. Pancreas: Normal appearance of the pancreas without inflammation or duct dilatation. Spleen: Normal appearance of spleen without enlargement. Adrenals/Urinary Tract: Normal adrenal glands. 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 This aneurysm measures approximately 0.9 cm and stable. No acute abnormality to the kidneys and negative for hydronephrosis. Normal appearance of the urinary bladder. Nonobstructive stone in left kidney lower pole. Small nonobstructive stone in the right kidney upper pole. Stomach/Bowel: Again noted is mild wall thickening involving the sigmoid colon with surrounding inflammatory changes. In addition, there is soft tissue thickening along the left side  of the sigmoid colon which is probably involving ovarian tissue. Small pocket of gas between the left ovary and the sigmoid colon on sequence 2 image 65 appears new or increased. Focal wall thickening at the hepatic flexure of the colon on sequence 2, image 25 and image 28. These areas of colonic wall thickening near the hepatic flexure persist on the delayed images and may not be associated with peristalsis. Normal appearance of the appendix. There is a large hiatal hernia. No gross abnormality to the duodenum or small bowel. Vascular/Lymphatic: Atherosclerotic calcifications in the aorta  and iliac arteries. No aortic aneurysm. No suspicious lymphadenopathy in the abdomen or pelvis. Reproductive: The uterus is surgically absent. However, there appears to be residual ovarian tissue. Inflammation involving the presumed left ovary. There is no significant gas within the ovarian tissue. Other: No significant free fluid. Increased edema or fat stranding in the left lower quadrant on sequence 2, image 58. Small pockets of gas between the sigmoid colon and left ovary as described. Otherwise, no evidence for free air. Musculoskeletal: Disc space narrowing and at L3-L4. Extensive facet arthropathy in the lower lumbar spine. No acute bone abnormality. IMPRESSION: Persistent inflammatory changes centered around the sigmoid colon and likely representing acute diverticulitis. There continues to be inflammatory changes along the left side of the sigmoid colon associated with the left ovary. Tiny pockets of gas between the sigmoid colon and left ovary may be new but there is no significant abscess collection in this area. There is slightly increased edema or stranding in the left lower quadrant. Segmental areas of colonic wall thickening involving the right colon and hepatic flexure. Findings could be associated with peristalsis but cannot exclude an inflammatory or neoplastic process. Recommend follow-up colonoscopy or correlation with prior colonoscopy. 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 Large hiatal hernia with bibasilar atelectasis. Nonobstructive kidney stones. Electronically Signed   By: Richarda Overlie M.D.   On: 07/02/2015 12:36      Assessment: Diverticulitis, appears uncomplicated this time day 3 of antibiotics  Plan: Continue antibiotics, full liquid diet. We'll follow with you.    Trella Thurmond C 07/03/2015, 9:59 AM  Pager [phone] If no answer or after 5 PM call [phone]

## 2015-07-04 DIAGNOSIS — K5792 Diverticulitis of intestine, part unspecified, without perforation or abscess without bleeding: Secondary | ICD-10-CM | POA: Diagnosis not present

## 2015-07-04 LAB — PROTIME-INR
INR: 2.39 — ABNORMAL HIGH (ref 0.00–1.49)
Prothrombin Time: 25.1 seconds — ABNORMAL HIGH (ref 11.6–15.2)

## 2015-07-04 MED ORDER — WARFARIN SODIUM 4 MG PO TABS
4.0000 mg | ORAL_TABLET | Freq: Once | ORAL | Status: AC
  Administered 2015-07-04: 4 mg via ORAL
  Filled 2015-07-04 (×2): qty 1

## 2015-07-04 NOTE — Progress Notes (Signed)
ANTICOAGULATION CONSULT NOTE - Follow Up  Pharmacy Consult for warfarin Indication: Hx of DVT, PE; thrombophilia (prothrombin gene mutation)  Allergies  Allergen Reactions  . Lyrica [Pregabalin]     MAKES HER CRAZY  . Omnicef [Cefdinir]     CANT REMEMBER  . Penicillins Itching    Has patient had a PCN reaction causing immediate rash, facial/tongue/throat swelling, SOB or lightheadedness with hypotension: No Has patient had a PCN reaction causing severe rash involving mucus membranes or skin necrosis: No Has patient had a PCN reaction that required hospitalization No Has patient had a PCN reaction occurring within the last 10 years: No If all of the above answers are "NO", then may proceed with Cephalosporin use.  Marland Kitchen. Zithromax [Azithromycin] Nausea And Vomiting  . Celebrex [Celecoxib] Hives, Itching and Rash  . Codeine Itching and Rash    throat swells. States that she tolerates vicodin     Vital Signs: Temp: 98.9 F (37.2 C) (06/18 0535) Temp Source: Oral (06/18 0535) BP: 156/81 mmHg (06/18 0535) Pulse Rate: 72 (06/18 0535)  Labs:  Recent Labs  07/02/15 0453 07/03/15 0538 07/04/15 0515  HGB 11.0* 11.4*  --   HCT 34.2* 34.6*  --   PLT 187 216  --   LABPROT 20.9* 25.9* 25.1*  INR 1.89* 2.50* 2.39*  CREATININE 0.67 0.61  --     Estimated Creatinine Clearance: 66.6 mL/min (by C-G formula based on Cr of 0.61).   Medical History: Past Medical History  Diagnosis Date  . Pulmonary embolism (HCC)   . DVT (deep venous thrombosis) (HCC)   . Thyroid disease   . Osteoarthritis   . Plantar fasciitis   . Allergy   . Anxiety   . Clotting disorder [facility](HCC)      Assessment: 70 y.o. Female with history of DVT/pulmonary embolism and thrombophilia (prothrombin gene mutation), hypothyroidism, osteoarthritis who came to the hospital with 1 day history of severe abdominal pain associated with passing of mucus and blood per rectum.  In the ED patient was found to have  diverticulitis on the CT abdomen.  Pt takes warfarin 4mg  daily except 6mg  on Monday and Friday.  Last dose 6/13@ 11am.   INR subtherapeutic (1.32) on admission.  Patient denies missing any warfarin doses and denies any recent dietary changes that would have increased her vitamin K intake.   See MAR for inpatient warfarin doses  Today, 07/04/2015  INR therapeutic = 2.39. INR stable this morning following large rate of INR rise with 6/17am INR  CBC: 6/17 - Hgb drifting down/stable this am.  Pltc WNL.  MD intentionally avoiding bridging with heparin / LMWH due to recent lower GI bleeding in setting of diverticulitis.  Diet: Clear liquids  Drug interactions: Cipro and Flagyl started 6/14 - these antibiotics often increase the INR and lower warfarin dosage requirements.  However, unexplained subtherapeutic INR prior to admission is noted.  Patient re-interviewed 6/16, confirms prior to admission warfarin dosage, with no missed doses and no increased intake of high-vitamin K foods.  Patient states [facility].  Goal of Therapy:  INR 2-3  1.  Warfarin 4mg  PO today x 1 (home dose) 2. Daily INR while inpatient 3. Emphasized to patient that at discharge she will need close outpatient monitoring of her INR for ongoing retitration of her warfarin dosage in setting of antibiotic therapy.  She expressed understanding of this.   Juliette Alcideustin Zeigler, PharmD, BCPS.   Pager: [phone] 07/04/2015 9:23 AM

## 2015-07-04 NOTE — Progress Notes (Signed)
[facility] Progress Note  Subjective: Patient's abdominal pain is proportionately improved since yesterday, having loose stools, tolerating diet  Objective: Vital signs in last 24 hours: Temp:  [97.6 F (36.4 C)-99 F (37.2 C)] 98.9 F (37.2 C) (06/18 0535) Pulse Rate:  [67-72] 72 (06/18 0535) Resp:  [18-20] 18 (06/18 0535) BP: (148-173)/(81-95) 156/81 mmHg (06/18 0535) SpO2:  [98 %-100 %] 99 % (06/18 0535) Weight change:    PE: Unchanged  Lab Results: Results for orders placed or performed during the hospital encounter of 06/30/15 (from the past 24 hour(s))  Protime-INR     Status: Abnormal   Collection Time: 07/04/15  5:15 AM  Result Value Ref Range   Prothrombin Time 25.1 (H) 11.6 - 15.2 seconds   INR 2.39 (H) 0.00 - 1.49    Studies/Results: Ct Abdomen Pelvis W Contrast  07/02/2015  CLINICAL DATA:  Diverticulitis and not feeling better. EXAM: CT ABDOMEN AND PELVIS WITH CONTRAST TECHNIQUE: Multidetector CT imaging of the abdomen and pelvis was performed using the standard protocol following bolus administration of intravenous contrast. CONTRAST:  100mL ISOVUE-300 IOPAMIDOL (ISOVUE-300) INJECTION 61% COMPARISON:  CT 06/30/2015 FINDINGS: Lower chest: 6 mm nodule at the left lung base on sequence 4, image 37 is unchanged from CT dated 01/23/2012 and likely benign. Bibasilar chest densities most likely represent volume loss and atelectasis, particularly along the large hiatal hernia. Trace left pleural fluid. Hepatobiliary: Gallbladder has been removed. Minimal intrahepatic biliary dilatation is unchanged. No acute abnormality to the liver. Main portal venous system is patent. Pancreas: Normal appearance of the pancreas without inflammation or duct dilatation. Spleen: Normal appearance of spleen without enlargement. Adrenals/Urinary Tract: Normal adrenal glands. 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 This aneurysm  measures approximately 0.9 cm and stable. No acute abnormality to the kidneys and negative for hydronephrosis. Normal appearance of the urinary bladder. Nonobstructive stone in left kidney lower pole. Small nonobstructive stone in the right kidney upper pole. Stomach/Bowel: Again noted is mild wall thickening involving the sigmoid colon with surrounding inflammatory changes. In addition, there is soft tissue thickening along the left side of the sigmoid colon which is probably involving ovarian tissue. Small pocket of gas between the left ovary and the sigmoid colon on sequence 2 image 65 appears new or increased. Focal wall thickening at the hepatic flexure of the colon on sequence 2, image 25 and image 28. These areas of colonic wall thickening near the hepatic flexure persist on the delayed images and may not be associated with peristalsis. Normal appearance of the appendix. There is a large hiatal hernia. No gross abnormality to the duodenum or small bowel. Vascular/Lymphatic: Atherosclerotic calcifications in the aorta and iliac arteries. No aortic aneurysm. No suspicious lymphadenopathy in the abdomen or pelvis. Reproductive: The uterus is surgically absent. However, there appears to be residual ovarian tissue. Inflammation involving the presumed left ovary. There is no significant gas within the ovarian tissue. Other: No significant free fluid. Increased edema or fat stranding in the left lower quadrant on sequence 2, image 58. Small pockets of gas between the sigmoid colon and left ovary as described. Otherwise, no evidence for free air. Musculoskeletal: Disc space narrowing and at L3-L4. Extensive facet arthropathy in the lower lumbar spine. No acute bone abnormality. IMPRESSION: Persistent inflammatory changes centered around the sigmoid colon and likely representing acute diverticulitis. There continues to be inflammatory changes along the left side of the sigmoid colon associated with the  left ovary.  Tiny pockets of gas between the sigmoid colon and left ovary may be new but there is no significant abscess collection in this area. There is slightly increased edema or stranding in the left lower quadrant. Segmental areas of colonic wall thickening involving the right colon and hepatic flexure. Findings could be associated with peristalsis but cannot exclude an inflammatory or neoplastic process. Recommend follow-up colonoscopy or correlation with prior colonoscopy. 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 Large hiatal hernia with bibasilar atelectasis. Nonobstructive kidney stones. Electronically Signed   By: Richarda Overlie M.D.   On: 07/02/2015 12:36      Assessment: Diverticulitis, appears uncomplicated  Plan: Advance diet gradually, continue Cipro and Flagyl, possibly switch to oral tomorrow.    Quinita Kostelecky C 07/04/2015, 10:15 AM  Pager [phone] If no answer or after 5 PM call [phone]

## 2015-07-04 NOTE — Progress Notes (Signed)
[facility]  PROGRESS NOTE  Sherry Levine ZOX:[id number] DOB: [birth year] DOA: 06/30/2015 PCP: Kevin FentonSamuel Bradshaw, MD    Brief HPI:  70 y.o. female, With history of DVT/pulmonary embolism, hypothyroidism, osteoarthritis who came to the hospital with 1 day history of severe abdominal pain associated with passing of mucus and blood per rectum. Patient denies fever but complains of being cold. Denies vomiting but had nausea. Patient complains of increased frequency of urination. No dysuria. Patient currently takes cortisone for DVT/PE. She had previous surgery of hysterectomy and cholecystectomy. In the ED patient was found to have diverticulitis on the CT abdomen. Started on Cipro and Flagyl.  Active Problems:   Acute diverticulitis   Diverticulitis   Assessment/Plan: 1. Diverticulitis-WBC is down to normal, she has been afebrile ,repeat CT scan abdomen done today again show stranding around sigmoid colon in the left lower quadrant continue Cipro and Flagyl. GI was consulted and recommend to continue with IV antibiotics.Diet has been advanced to soft diet 2. Dysuria-  Urinalysis is negative and urine culture is growing contamination. Patient is already on ciprofloxacin. 3. History of DVT/PE- continue Coumadin per pharmacy consultation. 4. Hypothyroidism- continue Synthroid, TSH 1.592.   DVT prophylaxis: patient on warfarin Code Status: Full code Family Communication: discussed with husband at bedside Disposition Plan:  Home in 1-2 days   Consultants: GI  Procedures:  None  Antibiotics:  Ciprofloxacin  Flagyl  Subjective: Patient seen and examined, feels better this morning. GI has advanced to soft diet  Objective: Filed Vitals:   07/03/15 1650 07/03/15 2116 07/03/15 2247 07/04/15 0535  BP: 148/86 173/95 159/86 156/81  Pulse: 67   72  Temp: 99 F (37.2 C) 97.6 F (36.4 C)  98.9 F (37.2 C)  TempSrc: Oral Axillary  Oral  Resp:  20  18  Height:      Weight:       SpO2: 98% 100%  99%    Intake/Output Summary (Last 24 hours) at 07/04/15 1410 Last data filed at 07/04/15 1215  Gross per 24 hour  Intake   1320 ml  Output      0 ml  Net   1320 ml   Filed Weights   07/02/15 1700  Weight: 82.555 kg (182 lb)    Examination:  General exam: Appears calm and comfortable  Respiratory system: Clear to auscultation. Respiratory effort normal. Cardiovascular system: S1 & S2 heard, RRR. No JVD, murm  Abdomen - mild tenderness in left lower quadrant. No organomegaly or masses felt. Normal bowel sounds heard. Central nervous system: Alert and oriented. No focal neurological deficits. Extremities: Symmetric 5 x 5 power. Skin: No rashes, lesions or ulcers Psychiatry: Judgement and insight appear normal. Mood & affect appropriate.    Data Reviewed: I have personally reviewed following labs and imaging studies Basic Metabolic Panel:  Recent Labs Lab 06/30/15 1350 07/01/15 0505 07/02/15 0453 07/03/15 0538  NA 136 137 139 141  K 3.7 3.9 3.8 3.8  CL 104 107 110 112*  CO2 22 25 25 25   GLUCOSE 101* 95 87 96  BUN 16 11 8  5*  CREATININE 0.72 0.76 0.67 0.61  CALCIUM 9.8 8.7* 8.7* 8.6*   Liver Function Tests:  Recent Labs Lab 06/30/15 1350 07/01/15 0505  AST 25 18  ALT 28 22  ALKPHOS 32* 26*  BILITOT 1.2 0.8  PROT 8.0 6.7  ALBUMIN 4.1 3.2*   CBC:  Recent Labs Lab 06/30/15 1350 07/01/15 0505 07/02/15 0453 07/03/15 0538  WBC 14.2* 13.2* 9.1  6.7  HGB 13.9 11.7* 11.0* 11.4*  HCT 40.6 36.1 34.2* 34.6*  MCV 96.7 101.7* 99.7 101.2*  PLT 244 199 187 216   Cardiac Enzymes: No results for input(s): CKTOTAL, CKMB, CKMBINDEX, TROPONINI in the last 168 hours.  Studies: No results found.  Scheduled Meds: . ciprofloxacin  400 mg Intravenous Q12H  . citalopram  20 mg Oral QHS  . fluticasone  2 spray Each Nare Daily  . levothyroxine  50 mcg Oral QAC breakfast  . LORazepam  1 mg Oral BID  . metronidazole  500 mg Intravenous Q8H  .  polyethylene glycol  17 g Oral TID  . rosuvastatin  5 mg Oral Daily  . warfarin  4 mg Oral ONCE-1800  . Warfarin - Pharmacist Dosing Inpatient   Does not apply q1800   Continuous Infusions: . sodium chloride 50 mL/hr at 07/04/15 1051       Time spent: 20 min    [facility]  [facility] Pager [phone]. If 7PM-7AM, please contact night-coverage at [email or web address], Office  [phone]  password [email or web address] 07/04/2015, 2:10 PM

## 2015-07-05 DIAGNOSIS — R300 Dysuria: Secondary | ICD-10-CM | POA: Diagnosis not present

## 2015-07-05 DIAGNOSIS — E039 Hypothyroidism, unspecified: Secondary | ICD-10-CM | POA: Diagnosis not present

## 2015-07-05 DIAGNOSIS — K5792 Diverticulitis of intestine, part unspecified, without perforation or abscess without bleeding: Secondary | ICD-10-CM | POA: Diagnosis not present

## 2015-07-05 DIAGNOSIS — K5732 Diverticulitis of large intestine without perforation or abscess without bleeding: Secondary | ICD-10-CM | POA: Diagnosis not present

## 2015-07-05 DIAGNOSIS — F419 Anxiety disorder, unspecified: Secondary | ICD-10-CM | POA: Diagnosis not present

## 2015-07-05 LAB — PROTIME-INR
INR: 2.3 — AB (ref 0.00–1.49)
Prothrombin Time: 24.3 seconds — ABNORMAL HIGH (ref 11.6–15.2)

## 2015-07-05 LAB — TROPONIN I: Troponin I: <0.03 ng/mL (ref <0.031)

## 2015-07-05 MED ORDER — POLYETHYLENE GLYCOL 3350 17 G PO PACK: 17.0000 g | PACK | Freq: Every day | ORAL | Status: DC

## 2015-07-05 MED ORDER — CIPROFLOXACIN HCL 500 MG PO TABS: 500.0000 mg | ORAL_TABLET | Freq: Two times a day (BID) | ORAL | Status: AC

## 2015-07-05 MED ORDER — HYDROCODONE-ACETAMINOPHEN 5-325 MG PO TABS: 1.0000 | ORAL_TABLET | Freq: Four times a day (QID) | ORAL | Status: DC | PRN

## 2015-07-05 MED ORDER — METRONIDAZOLE 500 MG PO TABS: 500.0000 mg | ORAL_TABLET | Freq: Three times a day (TID) | ORAL | Status: AC

## 2015-07-05 MED ORDER — WARFARIN SODIUM 6 MG PO TABS
6.0000 mg | ORAL_TABLET | Freq: Once | ORAL | Status: DC
  Filled 2015-07-05: qty 1

## 2015-07-05 MED ORDER — DIPHENHYDRAMINE HCL 25 MG PO CAPS
25.0000 mg | ORAL_CAPSULE | Freq: Once | ORAL | Status: AC
Start: 2015-07-05 — End: 2015-07-05
  Administered 2015-07-05: 25 mg via ORAL
  Filled 2015-07-05: qty 1

## 2015-07-05 NOTE — Progress Notes (Addendum)
Pt complaining of chest pain radiating to L arm at 08:30. Placed on 2LNC. VSS. NSR. Charge nurse notified. Pain quickly resolved. Dr. Sharl MaLama Paged at 08:38. Awaiting return call.

## 2015-07-05 NOTE — Care Management Note (Signed)
Case Management Note  Patient Details  Name: Becky SaxBetty C Garlow MRN: [id number] Date of Birth: Mar 03, 1945  Subjective/Objective:   Spoke to Administrator, artsfloor director about patient's current status-explained that observation status is the current status-the initial review by obs nurse,& outcome from med director was recc IP, but insurance company denied Inpatient, & will only pay for observation status. Since order is observation patient is not able to appeal-Director felt she will let patient know since patient was already very upset w/CM dept since she was told she was inpatient.                 Action/Plan:d/c plan home.   Expected Discharge Date:   (unknown)               Expected Discharge Plan:     In-House Referral:     Discharge planning Services     Post Acute Care Choice:    Choice offered to:     DME Arranged:    DME Agency:     HH Arranged:    HH Agency:     Status of Service:     Medicare Important Message Given:    Date Medicare IM Given:    Medicare IM give by:    Date Additional Medicare IM Given:    Additional Medicare Important Message give by:     If discussed at Long Length of Stay Meetings, dates discussed:    Additional Comments:  Lanier ClamMahabir, Griff Badley, RN 07/05/2015, 1:23 PM

## 2015-07-05 NOTE — Care Management Obs Status (Signed)
MEDICARE OBSERVATION STATUS NOTIFICATION   Patient Details  Name: Sherry SaxBetty C Levine MRN: [id number] Date of Birth: 1945/12/02   Medicare Observation Status Notification Given:  Yes    MahabirOlegario Messier, Lindee Leason, RN 07/05/2015, 11:40 AM

## 2015-07-05 NOTE — Progress Notes (Signed)
[facility] Progress Note  Subjective: Patient states she is feeling much better. Thinks her diverticulitis is improving. No complaints of abdominal pain.  Objective: Vital signs in last 24 hours: Temp:  [98.3 F (36.8 C)-99.3 F (37.4 C)] 98.5 F (36.9 C) (06/19 0832) Pulse Rate:  [58-71] 58 (06/19 0832) Resp:  [16-18] 16 (06/19 0832) BP: (136-146)/(69-88) 143/69 mmHg (06/19 0832) SpO2:  [99 %-100 %] 100 % (06/19 0832) Weight change:    PE:  No distress  Heart regular rhythm  Lungs clear  Abdomen soft with minimal discomfort in the left lower quadrant to deep palpation  Lab Results: Results for orders placed or performed during the hospital encounter of 06/30/15 (from the past 24 hour(s))  Protime-INR     Status: Abnormal   Collection Time: 07/05/15  5:04 AM  Result Value Ref Range   Prothrombin Time 24.3 (H) 11.6 - 15.2 seconds   INR 2.30 (H) 0.00 - 1.49    Studies/Results: No results found.    Assessment: Diverticulitis  Plan:   I think we can switch her from IV antibiotics to oral antibiotics now in the form of Cipro and Flagyl. She is not vomiting and she is able to eat. She should be able to absorb these. She is probably nearing a point where she could be discharged and followed up as an outpatient.    SAM F Lorieann Argueta 07/05/2015, 8:45 AM  Pager: [phone] If no answer or after 5 PM call [phone]

## 2015-07-05 NOTE — Discharge Summary (Signed)
Physician Discharge Summary  Sherry Levine WUJ:[id number] DOB: 11-12-1945 DOA: 06/30/2015  PCP: Kevin Fenton, MD  Admit date: 06/30/2015 Discharge date: 07/05/2015  Time spent: 35* minutes  Recommendations for Outpatient Follow-up:  1. Follow up GI in 2 weeks   Discharge Diagnoses:  Active Problems:   Acute diverticulitis   Diverticulitis   Discharge Condition: Stable   Diet recommendation: Regular diet  Filed Weights   07/02/15 1700  Weight: 82.555 kg (182 lb)    History of present illness:  71 y.o. female, With history of DVT/pulmonary embolism, hypothyroidism, osteoarthritis who came to the hospital with 1 day history of severe abdominal pain associated with passing of mucus and blood per rectum. Patient denies fever but complains of being cold. Denies vomiting but had nausea. Patient complains of increased frequency of urination. No dysuria. Patient currently takes cortisone for DVT/PE. She had previous surgery of hysterectomy and cholecystectomy. In the ED patient was found to have diverticulitis on the CT abdomen. Started on Cipro and Flagyl  Hospital Course:  1. Diverticulitis-WBC is down to normal, she has been afebrile ,repeat CT scan abdomen done again showed stranding around sigmoid colon in the left lower quadrant continued on  Cipro and Flagyl. GI was consulted and recommend to continue with IV antibiotics.Diet has been advanced to soft diet. At this time pain is almost resolved, will continue Miralax daily and po antibiotics for seven more days as per GI recommendations. She will follow up GI in 2 weeks.  2. Dysuria- Urinalysis is negative and urine culture is growing contamination. Patient is already on ciprofloxacin. 3. History of DVT/PE- continue Coumadin  4. Hypothyroidism- continue Synthroid, TSH 1.592.  Procedures:  None   Consultations:  GI  Discharge Exam: Filed Vitals:   07/05/15 0832 07/05/15 1300  BP: 143/69 140/84  Pulse: 58 70  Temp: 98.5  F (36.9 C) 98.6 F (37 C)  Resp: 16 14    General: Appears in no acute distress Cardiovascular: S1S2 RRR Respiratory: Clear bilaterally  Discharge Instructions   Discharge Instructions    Diet - low sodium heart healthy    Complete by:  As directed      Increase activity slowly    Complete by:  As directed           Current Discharge Medication List    START taking these medications   Details  ciprofloxacin (CIPRO) 500 MG tablet Take 1 tablet (500 mg total) by mouth 2 (two) times daily. Qty: 14 tablet, Refills: 0    HYDROcodone-acetaminophen (LORTAB) 5-325 MG tablet Take 1 tablet by mouth every 6 (six) hours as needed for moderate pain. Qty: 20 tablet, Refills: 0    metroNIDAZOLE (FLAGYL) 500 MG tablet Take 1 tablet (500 mg total) by mouth 3 (three) times daily. Qty: 21 tablet, Refills: 0    polyethylene glycol (MIRALAX / GLYCOLAX) packet Take 17 g by mouth daily. Qty: 14 each, Refills: 0      CONTINUE these medications which have NOT CHANGED   Details  cetirizine (ZYRTEC) 10 MG tablet TAKE 1 TABLET BY MOUTH EVERY DAY Qty: 30 tablet, Refills: 0    citalopram (CELEXA) 20 MG tablet Take 20 mg by mouth at bedtime.     ferrous fumarate (HEMOCYTE - 106 MG FE) 325 (106 Fe) MG TABS tablet Take 1 tablet by mouth daily.     fluticasone (FLONASE) 50 MCG/ACT nasal spray Place into both nostrils daily.    levothyroxine (SYNTHROID, LEVOTHROID) 50 MCG tablet TAKE 1  TABLET BY MOUTH EVERY DAY Qty: 30 tablet, Refills: 0    LORazepam (ATIVAN) 1 MG tablet Take 1 tablet (1 mg total) by mouth at bedtime as needed for anxiety. Qty: 30 tablet, Refills: 2    pantoprazole (PROTONIX) 40 MG tablet Take 40 mg by mouth daily.    rosuvastatin (CRESTOR) 5 MG tablet Take 5 mg by mouth daily.    warfarin (COUMADIN) 4 MG tablet Take 1 tablet (4 mg total) by mouth daily. Alternating 4mg /6mg  daily Qty: 60 tablet, Refills: 4   Associated Diagnoses: Prothrombin mutation (HCC); DVT (deep  venous thrombosis), left; Acute pulmonary embolism (HCC)       Allergies  Allergen Reactions  . Lyrica [Pregabalin]     MAKES HER CRAZY  . Omnicef [Cefdinir]     CANT REMEMBER  . Penicillins Itching    Has patient had a PCN reaction causing immediate rash, facial/tongue/throat swelling, SOB or lightheadedness with hypotension: No Has patient had a PCN reaction causing severe rash involving mucus membranes or skin necrosis: No Has patient had a PCN reaction that required hospitalization No Has patient had a PCN reaction occurring within the last 10 years: No If all of the above answers are "NO", then may proceed with Cephalosporin use.  Marland Kitchen Zithromax [Azithromycin] Nausea And Vomiting  . Celebrex [Celecoxib] Hives, Itching and Rash  . Codeine Itching and Rash    throat swells. States that she tolerates vicodin   Follow-up Information    Follow up with Gwenevere Abbot, MD In 2 weeks.   Specialty:  Gastroenterology   Contact information:   [address] [place] [address] [phone]        The results of significant diagnostics from this hospitalization (including imaging, microbiology, ancillary and laboratory) are listed below for reference.    Significant Diagnostic Studies: Ct Abdomen Pelvis W Contrast  07/02/2015  CLINICAL DATA:  Diverticulitis and not feeling better. EXAM: CT ABDOMEN AND PELVIS WITH CONTRAST TECHNIQUE: Multidetector CT imaging of the abdomen and pelvis was performed using the standard protocol following bolus administration of intravenous contrast. CONTRAST:  ISOVUE-300 IOPAMIDOL (ISOVUE-300) INJECTION 61% COMPARISON:  CT 06/30/2015 FINDINGS: Lower chest: 6 mm nodule at the left lung base on sequence 4, image 37 is unchanged from CT dated 01/23/2012 and likely benign. Bibasilar chest densities most likely represent volume loss and atelectasis, particularly along the large hiatal hernia. Trace left pleural fluid. Hepatobiliary: Gallbladder  has been removed. Minimal intrahepatic biliary dilatation is unchanged. No acute abnormality to the liver. Main portal venous system is patent. Pancreas: Normal appearance of the pancreas without inflammation or duct dilatation. Spleen: Normal appearance of spleen without enlargement. Adrenals/Urinary Tract: Normal adrenal glands. 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 This aneurysm measures approximately 0.9 cm and stable. No acute abnormality to the kidneys and negative for hydronephrosis. Normal appearance of the urinary bladder. Nonobstructive stone in left kidney lower pole. Small nonobstructive stone in the right kidney upper pole. Stomach/Bowel: Again noted is mild wall thickening involving the sigmoid colon with surrounding inflammatory changes. In addition, there is soft tissue thickening along the left side of the sigmoid colon which is probably involving ovarian tissue. Small pocket of gas between the left ovary and the sigmoid colon on sequence 2 image 65 appears new or increased. Focal wall thickening at the hepatic flexure of the colon on sequence 2, image 25 and image 28. These areas of colonic  wall thickening near the hepatic flexure persist on the delayed images and may not be associated with peristalsis. Normal appearance of the appendix. There is a large hiatal hernia. No gross abnormality to the duodenum or small bowel. Vascular/Lymphatic: Atherosclerotic calcifications in the aorta and iliac arteries. No aortic aneurysm. No suspicious lymphadenopathy in the abdomen or pelvis. Reproductive: The uterus is surgically absent. However, there appears to be residual ovarian tissue. Inflammation involving the presumed left ovary. There is no significant gas within the ovarian tissue. Other: No significant free fluid. Increased edema or fat stranding in the left lower quadrant on sequence 2, image 58. Small pockets of gas between the sigmoid  colon and left ovary as described. Otherwise, no evidence for free air. Musculoskeletal: Disc space narrowing and at L3-L4. Extensive facet arthropathy in the lower lumbar spine. No acute bone abnormality. IMPRESSION: Persistent inflammatory changes centered around the sigmoid colon and likely representing acute diverticulitis. There continues to be inflammatory changes along the left side of the sigmoid colon associated with the left ovary. Tiny pockets of gas between the sigmoid colon and left ovary may be new but there is no significant abscess collection in this area. There is slightly increased edema or stranding in the left lower quadrant. Segmental areas of colonic wall thickening involving the right colon and hepatic flexure. Findings could be associated with peristalsis but cannot exclude an inflammatory or neoplastic process. Recommend follow-up colonoscopy or correlation with prior colonoscopy. 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 Large hiatal hernia with bibasilar atelectasis. Nonobstructive kidney stones. Electronically Signed   By: Richarda OverlieAdam  Henn M.D.   On: 07/02/2015 12:36   Ct Abdomen Pelvis W Contrast  06/30/2015  CLINICAL DATA:  Lower abdominal pain radiating from LEFT lower quadrant to RIGHT lower quadrant for 4 days. Rectal pressure. EXAM: CT ABDOMEN AND PELVIS WITH CONTRAST TECHNIQUE: Multidetector CT imaging of the abdomen and pelvis was performed using the standard protocol following bolus administration of intravenous contrast. CONTRAST:  100mL ISOVUE-300 IOPAMIDOL (ISOVUE-300) INJECTION 61% COMPARISON:  Report [facility] CT [facility]13/2013 [facility]. FINDINGS: Lower chest: Large hiatal hernia with a significant percentage of the stomach in the chest. No evidence for obstruction of the distal esophagus or gastric outlet compromise. Hepatobiliary: No masses or other significant abnormality. Status post cholecystectomy. The new new Pancreas: No mass, inflammatory  changes, or other significant abnormality. Spleen: Within normal limits in size and appearance. Adrenals/Urinary Tract: No masses identified. No evidence of hydronephrosis. 2 x 3 mm nonobstructing renal calculus, LEFT lower pole. Stomach/Bowel: No evidence of obstruction, or abnormal fluid collection. Sigmoid diverticulosis, with indistinct surrounding soft tissues, and stranding of the pericolonic fat, consistent with sigmoid diverticulitis. No visible abscess. No free fluid or free air. Unremarkable appendix. Vascular/Lymphatic: No pathologically enlarged lymph nodes. No evidence of abdominal aortic aneurysm. 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 Reproductive: No mass or other significant abnormality. Other: None. Musculoskeletal:  No suspicious bone lesions identified. IMPRESSION: Sigmoid diverticulitis. No frank perforation, abscess, or free fluid. No bowel obstruction. 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 Nonobstructing LEFT renal calculus, 2 x 3 mm. Electronically Signed   By: Elsie StainJohn T Curnes M.D.   On: 06/30/2015 15:46    Microbiology: Recent Results (from the past 240 hour(s))  Urine culture     Status: Abnormal   Collection Time: 07/01/15  4:05 PM  Result Value Ref Range Status  Specimen Description URINE, RANDOM  Final   Special Requests NONE  Final   Culture MULTIPLE SPECIES PRESENT, SUGGEST RECOLLECTION (A)  Final   Report Status 07/02/2015 FINAL  Final     Labs: Basic Metabolic Panel:  Recent Labs Lab 06/30/15 1350 07/01/15 0505 07/02/15 0453 07/03/15 0538  NA 136 137 139 141  K 3.7 3.9 3.8 3.8  CL 104 107 110 112*  CO2 GLUCOSE 101* 95 87 96  BUN 5*  CREATININE 0.72 0.76 0.67 0.61  CALCIUM 9.8 8.7* 8.7* 8.6*   Liver Function Tests:  Recent Labs Lab 06/30/15 1350 07/01/15 0505  AST 25 18  ALT 28 22  ALKPHOS 32* 26*  BILITOT  1.2 0.8  PROT 8.0 6.7  ALBUMIN 4.1 3.2*   No results for input(s): LIPASE, AMYLASE in the last 168 hours. No results for input(s): AMMONIA in the last 168 hours. CBC:  Recent Labs Lab 06/30/15 1350 07/01/15 0505 07/02/15 0453 07/03/15 0538  WBC 14.2* 13.2* 9.1 6.7  HGB 13.9 11.7* 11.0* 11.4*  HCT 40.6 36.1 34.2* 34.6*  MCV 96.7 101.7* 99.7 101.2*  PLT 244 199 187 216   Cardiac Enzymes:  Recent Labs Lab 07/05/15 0922 07/05/15 1246  TROPONINI <0.03 <0.03    Signed:  Meredeth Ide MD.  [facility] 07/05/2015, 2:10 PM

## 2015-07-05 NOTE — Progress Notes (Signed)
ANTICOAGULATION CONSULT NOTE - Follow Up  Pharmacy Consult for warfarin Indication: Hx of DVT, PE; thrombophilia (prothrombin gene mutation)  Allergies  Allergen Reactions  . Lyrica [Pregabalin]     MAKES HER CRAZY  . Omnicef [Cefdinir]     CANT REMEMBER  . Penicillins Itching    Has patient had a PCN reaction causing immediate rash, facial/tongue/throat swelling, SOB or lightheadedness with hypotension: No Has patient had a PCN reaction causing severe rash involving mucus membranes or skin necrosis: No Has patient had a PCN reaction that required hospitalization No Has patient had a PCN reaction occurring within the last 10 years: No If all of the above answers are "NO", then may proceed with Cephalosporin use.  Marland Kitchen. Zithromax [Azithromycin] Nausea And Vomiting  . Celebrex [Celecoxib] Hives, Itching and Rash  . Codeine Itching and Rash    throat swells. States that she tolerates vicodin     Vital Signs: Temp: 98.3 F (36.8 C) (06/19 0639) Temp Source: Oral (06/19 0639) BP: 146/80 mmHg (06/19 0639) Pulse Rate: 64 (06/19 0639)  Labs:  Recent Labs  07/03/15 0538 07/04/15 0515 07/05/15 0504  HGB 11.4*  --   --   HCT 34.6*  --   --   PLT 216  --   --   LABPROT 25.9* 25.1* 24.3*  INR 2.50* 2.39* 2.30*  CREATININE 0.61  --   --     Estimated Creatinine Clearance: 66.6 mL/min (by C-G formula based on Cr of 0.61).   Medical History: Past Medical History  Diagnosis Date  . Pulmonary embolism (HCC)   . DVT (deep venous thrombosis) (HCC)   . Thyroid disease   . Osteoarthritis   . Plantar fasciitis   . Allergy   . Anxiety   . Clotting disorder [facility](HCC)      Assessment: 70 y.o. Female with history of DVT/pulmonary embolism and thrombophilia (prothrombin gene mutation), hypothyroidism, osteoarthritis who came to the hospital with 1 day history of severe abdominal pain associated with passing of mucus and blood per rectum.  In the ED patient was found to have diverticulitis  on the CT abdomen.  Pt takes warfarin 4mg  daily except 6mg  on Monday and Friday.  Last dose 6/13@ 11am.   INR subtherapeutic (1.32) on admission.  Patient denies missing any warfarin doses and denies any recent dietary changes that would have increased her vitamin K intake.   See MAR for inpatient warfarin doses  Today, 07/05/2015  INR therapeutic = 2.30. INR stable this morning following large rate of INR rise with 6/17am INR  CBC: 6/17 - Hgb drifting down/stable this am.  Pltc WNL.  MD intentionally avoiding bridging with heparin / LMWH due to recent lower GI bleeding in setting of diverticulitis.  Diet: Clear liquids.  Tolerating 100% of meal tray.  Drug interactions: Cipro and Flagyl started 6/14 - these antibiotics often increase the INR and lower warfarin dosage requirements.  However, unexplained subtherapeutic INR prior to admission is noted.  Patient re-interviewed 6/16, confirms prior to admission warfarin dosage, with no missed doses and no increased intake of high-vitamin K foods.  Patient states [facility].  Goal of Therapy:  INR 2-3  1.  Warfarin 6mg  PO today x 1 (home dose) 2. Daily INR while inpatient 3. Pharmacist has emphasized to patient that at discharge she will need close outpatient monitoring of her INR for ongoing retitration of her warfarin dosage in setting of antibiotic therapy.  She expressed  understanding of this.   Junita Push, PharmD, BCPS Pager: [phone] 07/05/2015 8:09 AM

## 2015-07-07 ENCOUNTER — Encounter: Payer: Medicare Other | Admitting: Pharmacist

## 2015-07-08 ENCOUNTER — Encounter: Payer: Medicare Other | Admitting: Pharmacist

## 2015-07-09 ENCOUNTER — Encounter: Payer: Self-pay | Admitting: Family Medicine

## 2015-07-09 ENCOUNTER — Ambulatory Visit (INDEPENDENT_AMBULATORY_CARE_PROVIDER_SITE_OTHER): Payer: Medicare Other | Admitting: Family Medicine

## 2015-07-09 VITALS — BP 138/80 | HR 74 | Temp 98.0°F | Ht 63.0 in | Wt 181.6 lb

## 2015-07-09 DIAGNOSIS — Z7901 Long term (current) use of anticoagulants: Secondary | ICD-10-CM | POA: Diagnosis not present

## 2015-07-09 DIAGNOSIS — K5793 Diverticulitis of intestine, part unspecified, without perforation or abscess with bleeding: Secondary | ICD-10-CM

## 2015-07-09 LAB — COAGUCHEK XS/INR WAIVED
INR: 2.9 — AB (ref 0.9–1.1)
PROTHROMBIN TIME: 34.2 s

## 2015-07-09 NOTE — Progress Notes (Signed)
HPI  Patient presents today here for hospital follow-up.  Patient was admitted to the hospital for acute diverticulitis with GI bleed. She's tolerating food and fluids again.  He states that since she got home she has not taken any Cipro or Flagyl because she was unsure whether due to her INR. She's had mild development of right lower quadrant pain. She denies fever, chills, sweats. She denies any additional GI bleeding.  She has follow-up with GI scheduled..  PMH: Smoking status noted ROS: Per HPI  Objective: BP 138/80 mmHg  Pulse 74  Temp(Src) 98 F (36.7 C) (Oral)  Ht 5' 3" (1.6 m)  Wt 181 lb 9.6 oz (82.373 kg)  BMI 32.18 kg/m2  LMP 04/05/1982 Gen: NAD, alert, cooperative with exam HEENT: NCAT CV: RRR, good S1/S2, no murmur Resp: CTABL, no wheezes, non-labored Abd: SNTND, BS present, no guarding or organomegaly Ext: No edema, warm Neuro: Alert and oriented  Assessment and plan:  # Acute diverticulitis Mild recurrence of right lower quadrant abdominal pain after stopping medication for 3 days Encouraged her to restart her Cipro and Flagyl at home She is afebrile and tolerating food and fluids easily.  # Chronic anticoagulation INR is 2.9 I recommended cutting the dose in half while she is on Cipro and Flagyl, will proceed with 2 mg daily of Coumadin and recheck INR in 1 week.      Orders Placed This Encounter  Procedures  . CoaguChek XS/INR Waived  . CBC with Differential  . CMP14+EGFR     Sam Bradshaw, MD [facility] 07/09/2015, 1:52 PM      

## 2015-07-09 NOTE — Patient Instructions (Signed)
Great to see you!  Lets cut your coumadin dose down to 1/2 th usual dose at 2 mg daily.   Let get you in to Tammy or michelle next week for INR check.

## 2015-07-10 ENCOUNTER — Telehealth: Payer: Self-pay | Admitting: Family Medicine

## 2015-07-10 LAB — CBC WITH DIFFERENTIAL/PLATELET
BASOS ABS: 0 10*3/uL (ref 0.0–0.2)
Basos: 0 %
EOS (ABSOLUTE): 0.1 10*3/uL (ref 0.0–0.4)
Eos: 1 %
HEMOGLOBIN: 12.2 g/dL (ref 11.1–15.9)
Hematocrit: 38.8 % (ref 34.0–46.6)
IMMATURE GRANS (ABS): 0 10*3/uL (ref 0.0–0.1)
IMMATURE GRANULOCYTES: 0 %
LYMPHS: 20 %
Lymphocytes Absolute: 1.8 10*3/uL (ref 0.7–3.1)
MCH: 31.7 pg (ref 26.6–33.0)
MCHC: 31.4 g/dL — ABNORMAL LOW (ref 31.5–35.7)
MCV: 101 fL — ABNORMAL HIGH (ref 79–97)
MONOCYTES: 11 %
Monocytes Absolute: 1 10*3/uL — ABNORMAL HIGH (ref 0.1–0.9)
NEUTROS ABS: 6.1 10*3/uL (ref 1.4–7.0)
NEUTROS PCT: 68 %
PLATELETS: 289 10*3/uL (ref 150–379)
RBC: 3.85 x10E6/uL (ref 3.77–5.28)
RDW: 13.2 % (ref 12.3–15.4)
WBC: 9 10*3/uL (ref 3.4–10.8)

## 2015-07-10 LAB — CMP14+EGFR
ALBUMIN: 3.6 g/dL (ref 3.5–4.8)
ALT: 16 IU/L (ref 0–32)
AST: 21 IU/L (ref 0–40)
Albumin/Globulin Ratio: 1.2 (ref 1.2–2.2)
Alkaline Phosphatase: 31 IU/L — ABNORMAL LOW (ref 39–117)
BUN/Creatinine Ratio: 23 (ref 12–28)
BUN: 16 mg/dL (ref 8–27)
Bilirubin Total: 0.2 mg/dL (ref 0.0–1.2)
CALCIUM: 9.3 mg/dL (ref 8.7–10.3)
CHLORIDE: 101 mmol/L (ref 96–106)
CO2: 24 mmol/L (ref 18–29)
Creatinine, Ser: 0.69 mg/dL (ref 0.57–1.00)
GFR, EST AFRICAN AMERICAN: 102 mL/min/{1.73_m2} (ref >59)
GFR, EST NON AFRICAN AMERICAN: 88 mL/min/{1.73_m2} (ref >59)
GLUCOSE: 89 mg/dL (ref 65–99)
Globulin, Total: 3 g/dL (ref 1.5–4.5)
Potassium: 4.2 mmol/L (ref 3.5–5.2)
Sodium: 142 mmol/L (ref 134–144)
TOTAL PROTEIN: 6.6 g/dL (ref 6.0–8.5)

## 2015-07-10 NOTE — Telephone Encounter (Signed)
Advised that results have not been officially reviewed and that Dr Ermalinda MemosBradshaw would review them when he returns to the office after the weekend. I did say that they looked essentially normal. She was mainly concerned about her hemoglobin which was WNL.

## 2015-07-16 ENCOUNTER — Encounter: Payer: Medicare Other | Admitting: Pharmacist Clinician (PhC)/ Clinical Pharmacy Specialist

## 2015-07-19 ENCOUNTER — Encounter: Payer: Medicare Other | Admitting: Pharmacist

## 2015-07-22 ENCOUNTER — Encounter: Payer: Medicare Other | Admitting: Pharmacist

## 2015-07-22 ENCOUNTER — Ambulatory Visit (INDEPENDENT_AMBULATORY_CARE_PROVIDER_SITE_OTHER): Payer: Medicare Other | Admitting: Pharmacist

## 2015-07-22 DIAGNOSIS — Z86718 Personal history of other venous thrombosis and embolism: Secondary | ICD-10-CM

## 2015-07-22 DIAGNOSIS — Z86711 Personal history of pulmonary embolism: Secondary | ICD-10-CM | POA: Diagnosis not present

## 2015-07-22 DIAGNOSIS — Z7901 Long term (current) use of anticoagulants: Secondary | ICD-10-CM | POA: Diagnosis not present

## 2015-07-22 LAB — COAGUCHEK XS/INR WAIVED
INR: 1.3 — ABNORMAL HIGH (ref 0.9–1.1)
PROTHROMBIN TIME: 15.4 s

## 2015-07-22 NOTE — Patient Instructions (Signed)
Anticoagulation Dose Instructions as of 07/22/2015      Glynis SmilesSun Mon Tue Wed Thu Fri Sat   New Dose 4 mg 4 mg 4 mg 4 mg 4 mg 4 mg 4 mg    Description        Take 6mg  = 1 and 1/2 tablets today- Thursday, July 6th.  Then restart usual dose of warfarin 4mg  - 1 tablet daily.     INR was 1.3 today

## 2015-08-04 ENCOUNTER — Encounter: Payer: Self-pay | Admitting: Pharmacist

## 2015-08-05 ENCOUNTER — Encounter: Payer: Self-pay | Admitting: Pharmacist

## 2015-08-05 ENCOUNTER — Encounter: Payer: Self-pay | Admitting: Family Medicine

## 2015-08-16 ENCOUNTER — Encounter: Payer: Medicare Other | Admitting: Pharmacist

## 2015-08-18 ENCOUNTER — Ambulatory Visit (INDEPENDENT_AMBULATORY_CARE_PROVIDER_SITE_OTHER): Payer: Medicare Other | Admitting: Pharmacist

## 2015-08-18 ENCOUNTER — Emergency Department (HOSPITAL_COMMUNITY): Payer: Medicare Other

## 2015-08-18 ENCOUNTER — Emergency Department (HOSPITAL_COMMUNITY)
Admission: EM | Admit: 2015-08-18 | Discharge: 2015-08-18 | Disposition: A | Discharge status: Home / Self Care | Payer: Medicare Other | Attending: Emergency Medicine | Admitting: Emergency Medicine

## 2015-08-18 ENCOUNTER — Encounter (HOSPITAL_COMMUNITY): Payer: Self-pay

## 2015-08-18 DIAGNOSIS — I82402 Acute embolism and thrombosis of unspecified deep veins of left lower extremity: Secondary | ICD-10-CM

## 2015-08-18 DIAGNOSIS — Z86711 Personal history of pulmonary embolism: Secondary | ICD-10-CM

## 2015-08-18 DIAGNOSIS — R1032 Left lower quadrant pain: Secondary | ICD-10-CM | POA: Diagnosis not present

## 2015-08-18 DIAGNOSIS — Z79899 Other long term (current) drug therapy: Secondary | ICD-10-CM | POA: Insufficient documentation

## 2015-08-18 DIAGNOSIS — Z7901 Long term (current) use of anticoagulants: Secondary | ICD-10-CM | POA: Diagnosis not present

## 2015-08-18 DIAGNOSIS — R1031 Right lower quadrant pain: Secondary | ICD-10-CM | POA: Diagnosis present

## 2015-08-18 DIAGNOSIS — D6859 Other primary thrombophilia: Secondary | ICD-10-CM

## 2015-08-18 DIAGNOSIS — M5136 Other intervertebral disc degeneration, lumbar region: Secondary | ICD-10-CM | POA: Insufficient documentation

## 2015-08-18 DIAGNOSIS — D6852 Prothrombin gene mutation: Secondary | ICD-10-CM

## 2015-08-18 DIAGNOSIS — R1030 Lower abdominal pain, unspecified: Secondary | ICD-10-CM

## 2015-08-18 DIAGNOSIS — Z86718 Personal history of other venous thrombosis and embolism: Secondary | ICD-10-CM

## 2015-08-18 HISTORY — DX: Diverticulitis of intestine, part unspecified, without perforation or abscess without bleeding: K57.92

## 2015-08-18 LAB — CBC
HEMATOCRIT: 39.2 % (ref 36.0–46.0)
Hemoglobin: 12.6 g/dL (ref 12.0–15.0)
MCH: 31.7 pg (ref 26.0–34.0)
MCHC: 32.1 g/dL (ref 30.0–36.0)
MCV: 98.7 fL (ref 78.0–100.0)
Platelets: 189 10*3/uL (ref 150–400)
RBC: 3.97 MIL/uL (ref 3.87–5.11)
RDW: 13 % (ref 11.5–15.5)
WBC: 7.5 10*3/uL (ref 4.0–10.5)

## 2015-08-18 LAB — URINALYSIS, ROUTINE W REFLEX MICROSCOPIC
BILIRUBIN URINE: NEGATIVE
Glucose, UA: NEGATIVE mg/dL
KETONES UR: NEGATIVE mg/dL
Leukocytes, UA: NEGATIVE
NITRITE: NEGATIVE
PH: 6 (ref 5.0–8.0)
Protein, ur: NEGATIVE mg/dL
SPECIFIC GRAVITY, URINE: 1.009 (ref 1.005–1.030)

## 2015-08-18 LAB — COMPREHENSIVE METABOLIC PANEL
ALBUMIN: 3.8 g/dL (ref 3.5–5.0)
ALK PHOS: 28 U/L — AB (ref 38–126)
ALT: 26 U/L (ref 14–54)
ANION GAP: 5 (ref 5–15)
AST: 23 U/L (ref 15–41)
BILIRUBIN TOTAL: 0.6 mg/dL (ref 0.3–1.2)
BUN: 16 mg/dL (ref 6–20)
CALCIUM: 9.7 mg/dL (ref 8.9–10.3)
CO2: 23 mmol/L (ref 22–32)
Chloride: 111 mmol/L (ref 101–111)
Creatinine, Ser: 0.8 mg/dL (ref 0.44–1.00)
Glucose, Bld: 112 mg/dL — ABNORMAL HIGH (ref 65–99)
POTASSIUM: 3.6 mmol/L (ref 3.5–5.1)
Sodium: 139 mmol/L (ref 135–145)
TOTAL PROTEIN: 7.1 g/dL (ref 6.5–8.1)

## 2015-08-18 LAB — TYPE AND SCREEN
ABO/RH(D): B POS
ANTIBODY SCREEN: NEGATIVE

## 2015-08-18 LAB — URINE MICROSCOPIC-ADD ON
BACTERIA UA: NONE SEEN
WBC UA: NONE SEEN WBC/hpf (ref 0–5)

## 2015-08-18 LAB — COAGUCHEK XS/INR WAIVED
INR: 1.4 — ABNORMAL HIGH (ref 0.9–1.1)
Prothrombin Time: 17.2 s

## 2015-08-18 LAB — LIPASE, BLOOD: Lipase: 57 U/L — ABNORMAL HIGH (ref 11–51)

## 2015-08-18 LAB — PROTIME-INR
INR: 1.29
PROTHROMBIN TIME: 16.1 s — AB (ref 11.4–15.2)

## 2015-08-18 LAB — POC OCCULT BLOOD, ED: Fecal Occult Bld: NEGATIVE

## 2015-08-18 MED ORDER — SODIUM CHLORIDE 0.9 % IV BOLUS (SEPSIS)
500.0000 mL | Freq: Once | INTRAVENOUS | Status: AC
  Administered 2015-08-18: 500 mL via INTRAVENOUS

## 2015-08-18 MED ORDER — HYDROCODONE-ACETAMINOPHEN 5-325 MG PO TABS: ORAL_TABLET | ORAL | 0 refills | Status: DC

## 2015-08-18 MED ORDER — ONDANSETRON HCL 4 MG/2ML IJ SOLN
4.0000 mg | Freq: Once | INTRAMUSCULAR | Status: AC
  Administered 2015-08-18: 4 mg via INTRAVENOUS
  Filled 2015-08-18: qty 2

## 2015-08-18 MED ORDER — IOPAMIDOL (ISOVUE-300) INJECTION 61%
100.0000 mL | Freq: Once | INTRAVENOUS | Status: AC | PRN
  Administered 2015-08-18: 100 mL via INTRAVENOUS

## 2015-08-18 MED ORDER — HYDROMORPHONE HCL 1 MG/ML IJ SOLN
0.5000 mg | Freq: Once | INTRAMUSCULAR | Status: AC
  Administered 2015-08-18: 0.5 mg via INTRAVENOUS
  Filled 2015-08-18: qty 1

## 2015-08-18 MED ORDER — ONDANSETRON 4 MG PO TBDP: 4.0000 mg | ORAL_TABLET | Freq: Three times a day (TID) | ORAL | 0 refills | Status: DC | PRN

## 2015-08-18 MED ORDER — DIATRIZOATE MEGLUMINE & SODIUM 66-10 % PO SOLN: 15.0000 mL | Freq: Once | ORAL | Status: DC

## 2015-08-18 MED ORDER — ACETAMINOPHEN 325 MG PO TABS
650.0000 mg | ORAL_TABLET | Freq: Once | ORAL | Status: AC
  Administered 2015-08-18: 650 mg via ORAL
  Filled 2015-08-18: qty 2

## 2015-08-18 NOTE — ED Triage Notes (Signed)
Pt c/o lower abdominal pain, nausea, rectal pressure, and black stools x 3 days.  Pain score 9/10.  Pt reports taking Tylenol w/o relief.  Pt reports similar symptoms x "a few months ago" and diagnosed of diverticulitis.  Pt reports taking Coumadin.  Pt reports difficulty having bowel movements.

## 2015-08-18 NOTE — ED Notes (Signed)
Tech at bedside collecting labs 

## 2015-08-18 NOTE — ED Notes (Signed)
Patient transported to CT 

## 2015-08-18 NOTE — Discharge Instructions (Signed)
Please read and follow all provided instructions.  Your diagnoses today include:  1. Lower abdominal pain     Tests performed today include: Blood counts and electrolytes Blood tests to check liver and kidney function Blood tests to check pancreas function Urine test to look for infection and pregnancy (in women) CT scan - shows resolution of your diverticulitis, no other serious problems Vital signs. See below for your results today.   Medications prescribed:  Vicodin (hydrocodone/acetaminophen) - narcotic pain medication  DO NOT drive or perform any activities that require you to be awake and alert because this medicine can make you drowsy. BE VERY CAREFUL not to take multiple medicines containing Tylenol (also called acetaminophen). Doing so can lead to an overdose which can damage your liver and cause liver failure and possibly death.  Zofran (ondansetron) - for nausea and vomiting  Take any prescribed medications only as directed.  Home care instructions:  Follow any educational materials contained in this packet.  Follow-up instructions: Please follow-up with your primary care provider in the next 3 days for further evaluation of your symptoms.    Return instructions:  SEEK IMMEDIATE MEDICAL ATTENTION IF: The pain does not go away or becomes severe  A temperature above 101F develops  Repeated vomiting occurs (multiple episodes)  The pain becomes localized to portions of the abdomen. The right side could possibly be appendicitis. In an adult, the left lower portion of the abdomen could be colitis or diverticulitis.  Blood is being passed in stools or vomit (bright red or black tarry stools)  You develop chest pain, difficulty breathing, dizziness or fainting, or become confused, poorly responsive, or inconsolable (young children) If you have any other emergent concerns regarding your health  Additional Information: Abdominal (belly) pain can be caused by many things. Your  caregiver performed an examination and possibly ordered blood/urine tests and imaging (CT scan, x-rays, ultrasound). Many cases can be observed and treated at home after initial evaluation in the emergency department. Even though you are being discharged home, abdominal pain can be unpredictable. Therefore, you need a repeated exam if your pain does not resolve, returns, or worsens. Most patients with abdominal pain don't have to be admitted to the hospital or have surgery, but serious problems like appendicitis and gallbladder attacks can start out as nonspecific pain. Many abdominal conditions cannot be diagnosed in one visit, so follow-up evaluations are very important.  Your vital signs today were: BP 167/82 (BP Location: Left Arm)    Pulse 71    Temp 98.7 F (37.1 C) (Oral)    Resp 11    Ht 5\' 3"  (1.6 m)    Wt 74.8 kg    LMP 04/05/1982    SpO2 100%    BMI 29.23 kg/m  If your blood pressure (bp) was elevated above 135/85 this visit, please have this repeated by your doctor within one month. --------------

## 2015-08-18 NOTE — ED Provider Notes (Signed)
[facility]-EMERGENCY DEPT Provider Note   CSN: [id number] Arrival date & time: 08/18/15  1453  First Provider Contact:  First MD Initiated Contact with Patient 08/18/15 1600        History   Chief Complaint Chief Complaint  Patient presents with  . Abdominal Pain  . Nausea  . Rectal Pain    HPI Sherry Levine is a 70 y.o. female.  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 Pain will radiate to the chest but no associated shortness of breath, cough or fever. She has had associated nausea without vomiting. She has noted "black" stools, no bright red blood. No treatments prior to arrival. No lightheadedness or syncope. Patient states that her symptoms are exactly the same as when she was admitted with diverticulitis. She also states that she had her PT checked today and it was found to be low. Her PCP has placed her on a new regimen to help increase it. She has upcoming follow-up with GI in 1 week. The onset of this condition was acute. The course is gradually worsening. Aggravating factors: none. Alleviating factors: none.        Past Medical History:  Diagnosis Date  . Allergy   . Anxiety   . Clotting disorder (HCC)   . Diverticulitis   . DVT (deep venous thrombosis) (HCC)   . Osteoarthritis   . Plantar fasciitis   . Pulmonary embolism (HCC)   . Thyroid disease     Patient Active Problem List   Diagnosis Date Noted  . History of pulmonary embolism 07/22/2015  . History of DVT (deep vein thrombosis) 07/22/2015  . Acute diverticulitis 06/30/2015  . Diverticulitis 06/30/2015  . Chronic anticoagulation 06/18/2015  . 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 Prothrombin mutation (HCC) 04/04/2012  . Acute pulmonary embolism (HCC) 04/04/2012  . Obstructive sleep apnea 05/08/2011  . Allergic rhinitis  05/01/2011  . Anxiety state 01/23/2011  . 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 Insomnia 11/16/2010  . Primary hypercoagulable state (HCC) 11/11/2010  . 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 GERD 03/21/2007  . PEPTIC STRICTURE 03/21/2007  . HIATAL HERNIA 03/21/2007  . IBS 03/21/2007  . ARTHRITIS 03/21/2007  . DEGENERATIVE JOINT DISEASE, LUMBAR SPINE 03/21/2007  . DISC DISEASE, CERVICAL 03/21/2007  . FIBROMYALGIA 03/21/2007    Past Surgical History:  Procedure Laterality Date  . ABDOMINAL HYSTERECTOMY    . carpel tunnel     right hand  . CERVICAL FUSION    . CHOLECYSTECTOMY  2015  . KNEE ARTHROSCOPY WITH LATERAL MENISECTOMY     right knee    OB History    No data available       Home Medications    Prior to Admission medications   Medication Sig Start Date End Date Taking? Authorizing Provider  cetirizine (ZYRTEC) 10 MG tablet TAKE 1 TABLET BY MOUTH EVERY DAY    Deatra Canter, FNP  ferrous fumarate (HEMOCYTE - 106 MG FE) 325 (106 Fe) MG TABS tablet Take 1 tablet by mouth daily. Reported on 07/22/2015    Historical Provider, MD  fluticasone (FLONASE) 50 MCG/ACT nasal spray Place 2 sprays into both nostrils daily.     Historical Provider, MD  levothyroxine (SYNTHROID, LEVOTHROID) 50 MCG tablet TAKE 1 TABLET BY MOUTH EVERY DAY 01/14/13   Ernestina Penna, MD  LORazepam Horton Marshall)  1 MG tablet Take 1 tablet (1 mg total) by mouth at bedtime as needed for anxiety. 07/16/12   Mary-Margaret Daphine Deutscher, FNP  pantoprazole (PROTONIX) 40 MG tablet Take 40 mg by mouth daily.    Historical Provider, MD  polyethylene glycol (MIRALAX / GLYCOLAX) packet Take 17 g by mouth daily. 07/05/15   Meredeth Ide, MD  rosuvastatin (CRESTOR) 5 MG tablet Take 5 mg by mouth every other day.     Historical Provider, MD  warfarin (COUMADIN) 4 MG tablet Take 6mg  on sundays and wednesdays.  Take 4mg  all other days. 08/18/15   Henrene Pastor, PharmD    Family History Family History  Problem  Relation Age of Onset  . Stroke Mother   . Heart disease Mother   . Stroke Father   . Heart disease Father     Social History Social History  Substance Use Topics  . Smoking status: Never Smoker  . Smokeless tobacco: Never Used  . Alcohol use No     Allergies   Lyrica [pregabalin]; Omnicef [cefdinir]; Penicillins; Zithromax [azithromycin]; Celebrex [celecoxib]; and Codeine   Review of Systems Review of Systems  Constitutional: Negative for fever.  HENT: Negative for rhinorrhea and sore throat.   Eyes: Negative for redness.  Respiratory: Negative for cough.   Cardiovascular: Positive for chest pain (Radiation from abdomen).  Gastrointestinal: Positive for abdominal pain, blood in stool and nausea. Negative for diarrhea and vomiting.  Genitourinary: Negative for dysuria.  Musculoskeletal: Negative for myalgias.  Skin: Negative for rash.  Neurological: Positive for headaches (chronic).     Physical Exam Updated Vital Signs BP 167/82 (BP Location: Left Arm)   Pulse 71   Temp 98.7 F (37.1 C) (Oral)   Resp 11   Ht 5\' 3"  (1.6 m)   Wt 74.8 kg   LMP 04/05/1982   SpO2 100%   BMI 29.23 kg/m   Physical Exam  Constitutional: She appears well-developed and well-nourished. She appears distressed (tearful).  HENT:  Head: Normocephalic and atraumatic.  Eyes: Conjunctivae are normal. Right eye exhibits no discharge. Left eye exhibits no discharge.  Neck: Normal range of motion. Neck supple.  Cardiovascular: Normal rate, regular rhythm and normal heart sounds.  Exam reveals no friction rub.   No murmur heard. Pulmonary/Chest: Effort normal and breath sounds normal. No respiratory distress. She has no wheezes. She has no rales.  Abdominal: Soft. She exhibits no distension and no mass. There is tenderness (bilateral lower abdomen). There is no guarding.  Musculoskeletal: She exhibits no edema.  Neurological: She is alert.  Skin: Skin is warm and dry.  Psychiatric: She has a  normal mood and affect.  Nursing note and vitals reviewed.    ED Treatments / Results  Labs (all labs ordered are listed, but only abnormal results are displayed) Labs Reviewed  LIPASE, BLOOD - Abnormal; Notable for the following:       Result Value   Lipase 57 (*)    All other components within normal limits  COMPREHENSIVE METABOLIC PANEL - Abnormal; Notable for the following:    Glucose, Bld 112 (*)    Alkaline Phosphatase 28 (*)    All other components within normal limits  URINALYSIS, ROUTINE W REFLEX MICROSCOPIC (NOT AT [facility]) - Abnormal; Notable for the following:    Hgb urine dipstick SMALL (*)    All other components within normal limits  PROTIME-INR - Abnormal; Notable for the following:    Prothrombin Time 16.1 (*)    All other components  within normal limits  URINE MICROSCOPIC-ADD ON - Abnormal; Notable for the following:    Squamous Epithelial / LPF 0-5 (*)    All other components within normal limits  CBC  POC OCCULT BLOOD, ED  TYPE AND SCREEN    EKG  EKG Interpretation  Date/Time:  Wednesday August 18 2015 16:39:02 EDT Ventricular Rate:  73 PR Interval:    QRS Duration: 80 QT Interval:  377 QTC Calculation: 416 R Axis:   -29 Text Interpretation:  Sinus rhythm Borderline left axis deviation Low voltage, precordial leads Baseline wander in lead(s) V1 since last tracing no significant change Confirmed by BELFI  MD, MELANIE ([id number]) on 08/18/2015 4:58:56 PM       Radiology Ct Abdomen Pelvis W Contrast  Result Date: 08/18/2015 CLINICAL DATA:  Lower abdominal pain, nausea, dark stools and rectal pressure for 3 days. History of diverticulitis with recurrence of symptoms, right lower greater than left lower. EXAM: CT ABDOMEN AND PELVIS WITH CONTRAST TECHNIQUE: Multidetector CT imaging of the abdomen and pelvis was performed using the standard protocol following bolus administration of intravenous contrast. CONTRAST:  ISOVUE-300 IOPAMIDOL (ISOVUE-300) INJECTION  61% COMPARISON:  CT abdomen/ pelvis 07/02/2015 FINDINGS: Lower chest: Mild dependent changes. No pleural fluid. Small nodule the left costophrenic angle measures 6 mm and is unchanged. Large hiatal hernia with partial intrathoracic stomach. Liver: No focal lesion. Hepatobiliary: Postcholecystectomy with unchanged intra and extrahepatic biliary prominence. Pancreas: No ductal dilatation or inflammation. Spleen: Normal. Adrenal glands: No nodule. Kidneys: Symmetric renal enhancement. No hydronephrosis. 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 There is a nonobstructing stone in the upper right kidney. Nonobstructing stone in the lower left kidney. Symmetric excretion on delayed phase images Stomach/Bowel: Right title, partially intrathoracic stomach. No gastric inflammation. There are no dilated or thickened small bowel loops. The previous areas of colonic wall thickening involving the right and sigmoid colon as well as peri- diverticular inflammation have resolved. No new colonic or diverticular inflammation. No acute diverticulitis. No evidence of rectal or perirectal inflammation. The appendix is normal. Vascular/Lymphatic: No retroperitoneal adenopathy. Abdominal aorta is normal in caliber. Atherosclerosis without aneurysm. Reproductive: Uterus is surgically absent. Previous inflammation about the presumed left ovary on prior exam has resolved. Bladder: Physiologically distended without wall thickening. Other: No free air, free fluid, or intra-abdominal fluid collection. Tiny fat containing umbilical hernia. Musculoskeletal: There are no acute or suspicious osseous abnormalities. Stable lumbar spine degenerative change. IMPRESSION: 1. No acute abnormality.  No evidence of acute diverticulitis. 2. The previous colonic wall thickening and changes of diverticulitis have resolved. 3. 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 Electronically Signed   By: Rubye Oaks M.D.   On: 08/18/2015 20:00    Procedures Procedures (including critical care time)  Medications Ordered in ED Medications  diatrizoate meglumine-sodium (GASTROGRAFIN) 66-10 % solution 15 mL (not administered)  sodium chloride 0.9 % bolus 500 mL (0 mLs Intravenous Stopped 08/18/15 2000)  HYDROmorphone (DILAUDID) injection 0.5 mg (0.5 mg Intravenous Given 08/18/15 1644)  ondansetron (ZOFRAN) injection 4 mg (4 mg Intravenous Given 08/18/15 1643)  iopamidol (ISOVUE-300) 61 % injection 100 mL (100 mLs Intravenous Contrast Given 08/18/15 1918)  acetaminophen (TYLENOL) tablet 650 mg (650 mg Oral Given 08/18/15 2037)  ondansetron (ZOFRAN) injection 4 mg (4 mg Intravenous Given 08/18/15 2118)     Initial Impression / Assessment and Plan / ED Course  I have reviewed the triage vital signs and  the nursing notes.  Pertinent labs & imaging results that were available during my care of the patient were reviewed by me and considered in my medical decision making (see chart for details).  Clinical Course   Patient discussed with and seen by Dr. Fredderick Phenix.   CT imaging was ordered to evaluate for recurrence or complication of previous diverticulitis. Lab workup is reassuring. Hemoccult is negative. White blood cell count is normal. No anemia. INR is low, as noted previously by patient.  Patient had one episode of vomiting in the ED after her CT scan. This was treated in emergency department with Zofran. Patient much improved. Tolerating water.  Discussed all results as well as reassuring CT findings. Patient is feeling well enough and comfortable with discharge to home. She will follow-up with her PCP regarding some of her more chronic complaints and has a follow-up already scheduled with GI, Dr. Evette Cristal, next week.  The patient was urged to return to the Emergency Department immediately with worsening of current symptoms, worsening abdominal pain,  persistent vomiting, blood noted in stools, fever, or any other concerns. The patient verbalized understanding.   Patient counseled on use of narcotic pain medications. Counseled not to combine these medications with others containing tylenol. Urged not to drink alcohol, drive, or perform any other activities that requires focus while taking these medications. The patient verbalizes understanding and agrees with the plan.    Final Clinical Impressions(s) / ED Diagnoses   Final diagnoses:  Lower abdominal pain   Lower abdominal pain: Recent diverticulitis. CT improved from previous tonight without signs of or complications from diverticulitis. No obvious etiology of pain on CT. No evidence of UTI. Hiatal hernia and renal artery aneurysm are unchanged. Vitals are stable, no fever. No active GI bleeding suspected. No signs of dehydration, patient is tolerating PO's. Lungs are clear and no signs suggestive of PNA. Low concern for appendicitis, cholecystitis, pancreatitis, ruptured viscus, UTI, kidney stone, aortic dissection, aortic aneurysm or other emergent abdominal etiology. Supportive therapy indicated with return if symptoms worsen.   GI bleed: No active bleeding, neg hemoccult, normal hemoglobin. Doubt serious upper GI bleed given exam and these findings. Feel that she can be appropriately addressed by GI follow-up. No indications for admission at this time.    New Prescriptions New Prescriptions   HYDROCODONE-ACETAMINOPHEN (NORCO/VICODIN) 5-325 MG TABLET    Take 1-2 tablets every 6 hours as needed for severe pain   ONDANSETRON (ZOFRAN ODT) 4 MG DISINTEGRATING TABLET    Take 1 tablet (4 mg total) by mouth every 8 (eight) hours as needed for nausea or vomiting.     Renne Crigler, PA-C 08/18/15 2236    Rolan Bucco, MD 08/18/15 [phone]

## 2015-08-18 NOTE — ED Notes (Signed)
Pt ambulated to restroom. 

## 2015-08-24 ENCOUNTER — Ambulatory Visit: Payer: Medicare Other | Admitting: Family Medicine

## 2015-08-27 ENCOUNTER — Encounter: Payer: Self-pay | Admitting: Family Medicine

## 2015-08-27 ENCOUNTER — Ambulatory Visit (INDEPENDENT_AMBULATORY_CARE_PROVIDER_SITE_OTHER): Payer: Medicare Other | Admitting: Family Medicine

## 2015-08-27 VITALS — BP 116/81 | HR 86 | Temp 97.8°F | Ht 63.0 in | Wt 175.8 lb

## 2015-08-27 DIAGNOSIS — F32A Depression, unspecified: Secondary | ICD-10-CM

## 2015-08-27 DIAGNOSIS — F411 Generalized anxiety disorder: Secondary | ICD-10-CM

## 2015-08-27 DIAGNOSIS — G4733 Obstructive sleep apnea (adult) (pediatric): Secondary | ICD-10-CM

## 2015-08-27 DIAGNOSIS — K5792 Diverticulitis of intestine, part unspecified, without perforation or abscess without bleeding: Secondary | ICD-10-CM | POA: Diagnosis not present

## 2015-08-27 DIAGNOSIS — G43809 Other migraine, not intractable, without status migrainosus: Secondary | ICD-10-CM | POA: Diagnosis not present

## 2015-08-27 DIAGNOSIS — M359 Systemic involvement of connective tissue, unspecified: Secondary | ICD-10-CM | POA: Diagnosis not present

## 2015-08-27 DIAGNOSIS — F329 Major depressive disorder, single episode, unspecified: Secondary | ICD-10-CM | POA: Diagnosis not present

## 2015-08-27 NOTE — Progress Notes (Signed)
   HPI  Patient presents today ER follow-up.  Patient was seen in the emergency room a few days ago for abdominal pain. She states that this is completely resolved.  She has complaints of several other things today.  Headaches Years of right-sided headaches, she states this happens 4 or 5 times a week. Previously she was on Cymbalta for mood, she did not feel this helped her and stopped about 3 months ago.  Sweats Patient states that she has cold sweats intermittently throughout the day, this is been going on for 2-3 years. She states that this was happening severely recently and she had an episode of shaking, she did not lose consciousness during this episode and did not have a postictal state. States she had a similar episode of shaking in 2012.  Depression, mood She feels severely depressed, she denies suicidal thoughts. She has recurrent grieving and thoughts about her son who died 2 years ago. She was previously taking Cymbalta, she states she took this for over 12 years but just didn't feel like it was helping her.  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 She's also having a cataract surgery in her left eye in a few weeks.   PMH: Smoking status noted ROS: Per HPI  Objective: BP 116/81   Pulse 86   Temp 97.8 F (36.6 C) (Oral)   Ht 5\' 3"  (1.6 m)   Wt 175 lb 12.8 oz (79.7 kg)   LMP 04/05/1982   BMI 31.14 kg/m  Gen: NAD, alert, cooperative with exam HEENT: NCAT CV: RRR, good S1/S2, no murmur Resp: CTABL, no wheezes, non-labored Ext: No edema, warm Neuro: Alert and  oriented, strength 5/5 and sensation intact in all 4 extremities, EOMI, PERRLA  Assessment and plan:  # Abdominal pain, diverticulitis Abdominal pain resolved, patient has follow-up GI  # Depression, mood disorder Depressed today, she denies suicidal thoughts and states this is the furthest pain over my She does not start medications today She's not convinced that Cymbalta was helpful previously Referring to psychiatry  # Hypothyroidism TSH checked about 2 months ago stable  # Migraine headaches Persistent, maybe helped by SNRI, offered restarting Cymbalta or trying Effexor, patient defers  Sleep apnea Stable, not using CPAP  Connective tissue disorder Suspicion of lupus, patient has rheumatology appointment for complete workup  Shaking episode, sweats Unclear etiology I do not feel that she has seizure disorder, hernia exam is reassuring Careful watchful waiting, recommended follow-up in 3-4 weeks as we have talked about several problems today.     Orders Placed This Encounter  Procedures  . Ambulatory referral to Psychiatry    Referral Priority:   Routine    Referral Type:   Psychiatric    Referral Reason:   Specialty Services Required    Requested Specialty:   Psychiatry    Number of Visits Requested:   1    Murtis SinkSam Tajae Rybicki, MD [facility] 08/27/2015, 12:12 PM

## 2015-08-27 NOTE — Patient Instructions (Addendum)
Great to see you!  Call Emerson MonteParrish Mckinney at [phone]  Lets see you again in 2-4 weeks

## 2015-08-31 ENCOUNTER — Ambulatory Visit (INDEPENDENT_AMBULATORY_CARE_PROVIDER_SITE_OTHER): Payer: Medicare Other | Admitting: Pharmacist

## 2015-08-31 DIAGNOSIS — Z86718 Personal history of other venous thrombosis and embolism: Secondary | ICD-10-CM | POA: Diagnosis not present

## 2015-08-31 DIAGNOSIS — Z86711 Personal history of pulmonary embolism: Secondary | ICD-10-CM

## 2015-08-31 DIAGNOSIS — Z7901 Long term (current) use of anticoagulants: Secondary | ICD-10-CM

## 2015-08-31 LAB — COAGUCHEK XS/INR WAIVED
INR: 1.2 — ABNORMAL HIGH (ref 0.9–1.1)
PROTHROMBIN TIME: 14.2 s

## 2015-09-01 ENCOUNTER — Encounter: Payer: Self-pay | Admitting: Pharmacist

## 2015-09-02 ENCOUNTER — Other Ambulatory Visit: Payer: Self-pay | Admitting: Gastroenterology

## 2015-09-02 ENCOUNTER — Ambulatory Visit: Payer: Medicare Other | Admitting: Internal Medicine

## 2015-09-02 DIAGNOSIS — K5792 Diverticulitis of intestine, part unspecified, without perforation or abscess without bleeding: Secondary | ICD-10-CM

## 2015-09-06 ENCOUNTER — Other Ambulatory Visit: Payer: Self-pay | Admitting: Gastroenterology

## 2015-09-06 ENCOUNTER — Ambulatory Visit
Admission: RE | Admit: 2015-09-06 | Discharge: 2015-09-06 | Disposition: A | Discharge status: Home / Self Care | Payer: Medicare Other | Source: Ambulatory Visit | Attending: Gastroenterology | Admitting: Gastroenterology

## 2015-09-06 DIAGNOSIS — K5792 Diverticulitis of intestine, part unspecified, without perforation or abscess without bleeding: Secondary | ICD-10-CM

## 2015-09-06 DIAGNOSIS — K449 Diaphragmatic hernia without obstruction or gangrene: Secondary | ICD-10-CM

## 2015-09-06 DIAGNOSIS — R1010 Upper abdominal pain, unspecified: Secondary | ICD-10-CM

## 2015-09-16 ENCOUNTER — Encounter: Payer: Self-pay | Admitting: Pharmacist

## 2015-09-22 ENCOUNTER — Encounter: Payer: Medicare Other | Admitting: Pharmacist

## 2015-09-23 ENCOUNTER — Ambulatory Visit (INDEPENDENT_AMBULATORY_CARE_PROVIDER_SITE_OTHER): Payer: Medicare Other | Admitting: Pharmacist

## 2015-09-23 DIAGNOSIS — Z86718 Personal history of other venous thrombosis and embolism: Secondary | ICD-10-CM

## 2015-09-23 DIAGNOSIS — Z86711 Personal history of pulmonary embolism: Secondary | ICD-10-CM | POA: Diagnosis not present

## 2015-09-23 DIAGNOSIS — Z7901 Long term (current) use of anticoagulants: Secondary | ICD-10-CM

## 2015-09-23 LAB — COAGUCHEK XS/INR WAIVED
INR: 3.3 — AB (ref 0.9–1.1)
Prothrombin Time: 39 s

## 2015-09-24 ENCOUNTER — Ambulatory Visit (HOSPITAL_COMMUNITY): Payer: Self-pay | Admitting: Psychiatry

## 2015-09-24 ENCOUNTER — Other Ambulatory Visit: Payer: Self-pay | Admitting: Gastroenterology

## 2015-09-24 DIAGNOSIS — Z7901 Long term (current) use of anticoagulants: Secondary | ICD-10-CM

## 2015-09-24 DIAGNOSIS — Z1211 Encounter for screening for malignant neoplasm of colon: Secondary | ICD-10-CM

## 2015-09-24 NOTE — Progress Notes (Deleted)
Psychiatric Initial Adult Assessment   Patient Identification: Sherry SaxBetty C Levine MRN:  [id number] Date of Evaluation:  09/24/2015 Referral Source: Elenora GammaSamuel L Bradshaw, MD for depression Chief Complaint:   Visit Diagnosis: No diagnosis found.  History of Present Illness:   70 year old female with history of depression, hypothyroidism, diverticulitis, DVT/PE on coumadin referred for depression    09/03/2015 03/29/2015 LORAZEPAM 1 MG TABLET [id number] 60 30 5 5  [id number] VYAS DHRUV BHUPENDRAPRASAD MD [place], [place] [id number] [facility] [place], [place], [place] 1945-12-14 [address] [place], [place] [id number] 03 0 08/19/2015 08/18/2015 HYDROCODON- ACETAMINOPHEN 5- 325 [id number] 8 1 0 0 [id number] [place] [id number] [facility] [place], [place], [place] 1945-12-14 [address] [place], [place] [id number]    Associated Signs/Symptoms: Depression Symptoms:  {DEPRESSION SYMPTOMS:20000} (Hypo) Manic Symptoms:  {BHH MANIC SYMPTOMS:22872} Anxiety Symptoms:  {BHH ANXIETY SYMPTOMS:22873} Psychotic Symptoms:  {BHH PSYCHOTIC SYMPTOMS:22874} PTSD Symptoms: {BHH PTSD SYMPTOMS:22875}  Past Psychiatric History: ***  Previous Psychotropic Medications: {YES/NO:21197}  Substance Abuse History in the last 12 months:  {yes no:314532}  Consequences of Substance Abuse: {BHH CONSEQUENCES OF SUBSTANCE ABUSE:22880}  Past Medical History:  Past Medical History:  Diagnosis Date  . Allergy   . Anxiety   . Clotting disorder (HCC)   . Diverticulitis   . DVT (deep venous thrombosis) (HCC)   . Osteoarthritis   . Plantar fasciitis   . Pulmonary embolism (HCC)   . Thyroid disease     Past Surgical History:  Procedure Laterality Date  . ABDOMINAL HYSTERECTOMY    . carpel tunnel     right hand  . CERVICAL FUSION    . CHOLECYSTECTOMY  2015  . KNEE ARTHROSCOPY WITH LATERAL MENISECTOMY     right knee    Family Psychiatric History: ***  Family History:  Family History  Problem  Relation Age of Onset  . Stroke Mother   . Heart disease Mother   . Stroke Father   . Heart disease Father     Social History:   Social History   Social History  . Marital status: Married    Spouse name: N/A  . Number of children: N/A  . Years of education: N/A   Social History Main Topics  . Smoking status: Never Smoker  . Smokeless tobacco: Never Used  . Alcohol use No  . Drug use: No  . Sexual activity: Not on file   Other Topics Concern  . Not on file   Social History Narrative  . No narrative on file    Additional Social History: ***  Allergies:   Allergies  Allergen Reactions  . Lyrica [Pregabalin]     MAKES HER CRAZY  . Omnicef [Cefdinir]     CANT REMEMBER  . Penicillins Itching    Has patient had a PCN reaction causing immediate rash, facial/tongue/throat swelling, SOB or lightheadedness with hypotension: No Has patient had a PCN reaction causing severe rash involving mucus membranes or skin necrosis: No Has patient had a PCN reaction that required hospitalization No Has patient had a PCN reaction occurring within the last 10 years: No If all of the above answers are "NO", then may proceed with Cephalosporin use.  Marland Kitchen. Zithromax [Azithromycin] Nausea And Vomiting  . Celebrex [Celecoxib] Hives, Itching and Rash  . Codeine Itching and Rash    throat swells. States that she tolerates vicodin    Metabolic Disorder Labs: No results found for: HGBA1C, MPG No results found for: PROLACTIN No  results found for: CHOL, TRIG, HDL, CHOLHDL, VLDL, LDLCALC   Current Medications: Current Outpatient Prescriptions  Medication Sig Dispense Refill  . acetaminophen (TYLENOL) 500 MG tablet Take 1,000 mg by mouth every 6 (six) hours as needed for mild pain.    . cetirizine (ZYRTEC) 10 MG tablet TAKE 1 TABLET BY MOUTH EVERY DAY 30 tablet 0  . colchicine 0.6 MG tablet Take 2 tablets (1.2mg ) by mouth at first sign of gout flare followed by 1 tablet (0.6mg ) after 1 hour. (Max  1.8mg  within 1 hour)    . fluticasone (FLONASE) 50 MCG/ACT nasal spray Place 2 sprays into both nostrils daily.     Marland Kitchen HYDROcodone-acetaminophen (NORCO/VICODIN) 5-325 MG tablet Take 1-2 tablets every 6 hours as needed for severe pain 8 tablet 0  . levothyroxine (SYNTHROID, LEVOTHROID) 50 MCG tablet TAKE 1 TABLET BY MOUTH EVERY DAY 30 tablet 0  . LORazepam (ATIVAN) 1 MG tablet Take 1 tablet (1 mg total) by mouth at bedtime as needed for anxiety. 30 tablet 2  . ondansetron (ZOFRAN ODT) 4 MG disintegrating tablet Take 1 tablet (4 mg total) by mouth every 8 (eight) hours as needed for nausea or vomiting. 10 tablet 0  . pantoprazole (PROTONIX) 40 MG tablet Take 40 mg by mouth daily.    . polyethylene glycol (MIRALAX / GLYCOLAX) packet Take 17 g by mouth daily. 14 each 0  . rosuvastatin (CRESTOR) 5 MG tablet Take 5 mg by mouth every other day.    . warfarin (COUMADIN) 4 MG tablet Take 6mg  on sundays and wednesdays.  Take 4mg  all other days. 60 tablet 4   No current facility-administered medications for this visit.     Neurologic: Headache: {BHH YES OR NO:22294} Seizure: {BHH YES OR NO:22294} Paresthesias:{BHH YES OR ZO:10960}  Musculoskeletal: Strength & Muscle Tone: {desc; muscle tone:32375} Gait & Station: {PE GAIT ED AVWU:98119} Patient leans: {Patient Leans:21022755}  Psychiatric Specialty Exam: ROS  Last menstrual period 04/05/1982.There is no height or weight on file to calculate BMI.  General Appearance: {Appearance:22683}  Eye Contact:  {BHH EYE CONTACT:22684}  Speech:  {Speech:22685}  Volume:  {Volume (PAA):22686}  Mood:  {BHH MOOD:22306}  Affect:  {Affect (PAA):22687}  Thought Process:  {Thought Process (PAA):22688}  Orientation:  {BHH ORIENTATION (PAA):22689}  Thought Content:  {Thought Content:22690}  Suicidal Thoughts:  {ST/HT (PAA):22692}  Homicidal Thoughts:  {ST/HT (PAA):22692}  Memory:  {BHH MEMORY:22881}  Judgement:  {Judgement (PAA):22694}  Insight:  {Insight  (PAA):22695}  Psychomotor Activity:  {Psychomotor (PAA):22696}  Concentration:  {Concentration:21399}  Recall:  {BHH GOOD/FAIR/POOR:22877}  Fund of Knowledge:{BHH GOOD/FAIR/POOR:22877}  Language: {BHH GOOD/FAIR/POOR:22877}  Akathisia:  {BHH YES OR NO:22294}  Handed:  {Handed:22697}  AIMS (if indicated):  ***  Assets:  {Assets (PAA):22698}  ADL's:  {BHH JYN'W:29562}  Cognition: {chl bhh cognition:304700322}  Sleep:  ***    Treatment Plan Summary: {CHL AMB BH MD TX ZHYQ:[id number]}   Neysa Hotter, MD 9/8/20179:01 AM

## 2015-09-27 ENCOUNTER — Ambulatory Visit (HOSPITAL_COMMUNITY): Payer: Self-pay | Admitting: Psychiatry

## 2015-09-28 ENCOUNTER — Ambulatory Visit: Payer: Medicare Other | Admitting: Family Medicine

## 2015-10-07 ENCOUNTER — Ambulatory Visit: Payer: Medicare Other | Admitting: Family Medicine

## 2015-10-12 ENCOUNTER — Other Ambulatory Visit: Payer: Self-pay

## 2015-10-15 ENCOUNTER — Ambulatory Visit (INDEPENDENT_AMBULATORY_CARE_PROVIDER_SITE_OTHER): Payer: Medicare Other | Admitting: Family Medicine

## 2015-10-15 ENCOUNTER — Encounter: Payer: Self-pay | Admitting: Family Medicine

## 2015-10-15 VITALS — BP 110/68 | HR 102 | Temp 97.7°F | Ht 63.0 in | Wt 176.8 lb

## 2015-10-15 DIAGNOSIS — F411 Generalized anxiety disorder: Secondary | ICD-10-CM | POA: Diagnosis not present

## 2015-10-15 DIAGNOSIS — M797 Fibromyalgia: Secondary | ICD-10-CM | POA: Diagnosis not present

## 2015-10-15 DIAGNOSIS — Z23 Encounter for immunization: Secondary | ICD-10-CM | POA: Diagnosis not present

## 2015-10-15 DIAGNOSIS — Z7901 Long term (current) use of anticoagulants: Secondary | ICD-10-CM | POA: Diagnosis not present

## 2015-10-15 LAB — COAGUCHEK XS/INR WAIVED
INR: 3.9 — ABNORMAL HIGH (ref 0.9–1.1)
PROTHROMBIN TIME: 46.8 s

## 2015-10-15 MED ORDER — LORAZEPAM 1 MG PO TABS: 0.5000 mg | ORAL_TABLET | Freq: Every evening | ORAL | 3 refills | Status: DC | PRN

## 2015-10-15 MED ORDER — VENLAFAXINE HCL ER 37.5 MG PO CP24: 37.5000 mg | ORAL_CAPSULE | Freq: Every day | ORAL | 3 refills | Status: DC

## 2015-10-15 NOTE — Patient Instructions (Signed)
Great to see you!  Effexor can help with anxiety, depression, fibromyalgia, and pain, be patient as we get started slowly with it.   Lets follow up in 1 month

## 2015-10-15 NOTE — Progress Notes (Addendum)
   HPI  Patient presents today for follow-up of anxiety and to discuss medication refills.  Patient states that she was recently diagnosed with pseudogout, fibromyalgia, and osteoarthritis by rheumatology. It was recommended that she go to the pain clinic, she would not like to do this.  Anxiety Has been taking Ativan 1 or 2 pills daily for 10-12 years. She states that she needs it very much to sleep at night. Previously did not feel like Cymbalta was helping, she stopped this and then tried to restart it 60 mg once causing headaches.  Anticoagulation Takes 6 mg of Coumadin on Monday and Friday and 4 mg on other days. No bleeding, no diet changes, good compliance  PMH: Smoking status noted ROS: Per HPI  Objective: BP 110/68   Pulse (!) 102   Temp 97.7 F (36.5 C) (Oral)   Ht 5\' 3"  (1.6 m)   Wt 176 lb 12.8 oz (80.2 kg)   LMP 04/05/1982   BMI 31.32 kg/m  Gen: NAD, alert, cooperative with exam HEENT: NCAT CV: RRR, good S1/S2, no murmur Resp: CTABL, no wheezes, non-labored Ext: No edema, warm Neuro: Alert and oriented, No gross deficits   [facility] 10/15/2015 08/27/2015 07/09/2015 06/18/2015  Decreased Interest 0 3 1 0  Down, Depressed, Hopeless 0 3 0 0  PHQ - 2 Score 0 6 1 0  Altered sleeping - 3 - -  Tired, decreased energy - 3 - -  Change in appetite - 3 - -  Feeling bad or failure about yourself  - 3 - -  Trouble concentrating - 3 - -  Moving slowly or fidgety/restless - 0 - -  Suicidal thoughts - 0 - -  PHQ-9 Score - 21 - -  Difficult doing work/chores - Somewhat difficult - -     Assessment and plan:  # Anxiety Had a during discussion that I do not prefer to treat patients with hydrocodone and benzodiazepines at the same time, she is currently requesting this. She would like to continue anxiety medications only Starting Effexor, slow titration Follow-up one month  # Fibromyalgia Helped by Tylenol, hopefully Effexor will also help. Patient  does not want to go to a pain clinic, she also has pseudogout and multiple areas of arthritis  # Anticoagulation INR today is 3.9- supratherputic No bleeding reduce dose to 4 mg daly (weekly reduction of 2 mg), hold for 2 days, and re-check in 2 weeks.  Flu shot given today-counseling provided  Orders Placed This Encounter  Procedures  . CoaguChek XS/INR Waived    Meds ordered this encounter  Medications  . LORazepam (ATIVAN) 1 MG tablet    Sig: Take 0.5-1 tablets (0.5-1 mg total) by mouth at bedtime as needed for anxiety.    Dispense:  30 tablet    Refill:  3  . venlafaxine XR (EFFEXOR XR) 37.5 MG 24 hr capsule    Sig: Take 1 capsule (37.5 mg total) by mouth daily with breakfast.    Dispense:  30 capsule    Refill:  3    Murtis SinkSam Ruven Corradi, MD [facility] 10/15/2015, 2:40 PM

## 2015-10-21 ENCOUNTER — Encounter: Payer: Self-pay | Admitting: Pharmacist

## 2015-10-22 ENCOUNTER — Inpatient Hospital Stay: Admission: RE | Admit: 2015-10-22 | Payer: Self-pay | Source: Ambulatory Visit

## 2015-10-25 ENCOUNTER — Ambulatory Visit (INDEPENDENT_AMBULATORY_CARE_PROVIDER_SITE_OTHER): Payer: Medicare Other | Admitting: Pharmacist

## 2015-10-25 DIAGNOSIS — Z86711 Personal history of pulmonary embolism: Secondary | ICD-10-CM | POA: Diagnosis not present

## 2015-10-25 DIAGNOSIS — Z7901 Long term (current) use of anticoagulants: Secondary | ICD-10-CM

## 2015-10-25 DIAGNOSIS — Z86718 Personal history of other venous thrombosis and embolism: Secondary | ICD-10-CM

## 2015-10-25 LAB — COAGUCHEK XS/INR WAIVED
INR: 1.8 — AB (ref 0.9–1.1)
PROTHROMBIN TIME: 21.5 s

## 2015-11-05 ENCOUNTER — Ambulatory Visit
Admission: RE | Admit: 2015-11-05 | Discharge: 2015-11-05 | Disposition: A | Discharge status: Home / Self Care | Payer: Medicare Other | Source: Ambulatory Visit | Attending: Gastroenterology | Admitting: Gastroenterology

## 2015-11-05 DIAGNOSIS — Z7901 Long term (current) use of anticoagulants: Secondary | ICD-10-CM

## 2015-11-05 DIAGNOSIS — Z1211 Encounter for screening for malignant neoplasm of colon: Secondary | ICD-10-CM

## 2015-11-16 ENCOUNTER — Encounter: Payer: Self-pay | Admitting: Family Medicine

## 2015-11-24 ENCOUNTER — Encounter: Payer: Self-pay | Admitting: Family Medicine

## 2015-11-24 ENCOUNTER — Ambulatory Visit (INDEPENDENT_AMBULATORY_CARE_PROVIDER_SITE_OTHER): Payer: Medicare Other | Admitting: Family Medicine

## 2015-11-24 DIAGNOSIS — Z7901 Long term (current) use of anticoagulants: Secondary | ICD-10-CM

## 2015-11-24 DIAGNOSIS — Z86718 Personal history of other venous thrombosis and embolism: Secondary | ICD-10-CM | POA: Diagnosis not present

## 2015-11-24 DIAGNOSIS — Z86711 Personal history of pulmonary embolism: Secondary | ICD-10-CM | POA: Diagnosis not present

## 2015-11-24 LAB — COAGUCHEK XS/INR WAIVED
INR: 2.9 — ABNORMAL HIGH (ref 0.9–1.1)
PROTHROMBIN TIME: 34.3 s

## 2015-11-24 MED ORDER — VENLAFAXINE HCL ER 75 MG PO CP24: 75.0000 mg | ORAL_CAPSULE | Freq: Every day | ORAL | 1 refills | Status: DC

## 2015-11-24 NOTE — Progress Notes (Signed)
   HPI  Patient presents today here to follow-up for fibromyalgia, anxiety, and chronic anticoagulation.  Anticoagulation Patient taking 6 mg of Coumadin on Monday and Friday, 4 mg and other days, 4 mg daily has been her chronic dose but we have had to adjust it recently adding and subtracting 2 mg per week. She denies any bleeding or change in diet.  Anxiety, fibromyalgia Still some severe pains associated with fibromyalgia. Effexor is easily tolerated, she can see a slight difference but no significant improvement. She would like to go to 75 mg daily. No suicidal thoughts. Anxiety is stable.  Had a DEXA scan about one week ago.  PMH: Smoking status noted ROS: Per HPI  Objective: BP 124/79   Pulse 90   Temp 97.3 F (36.3 C) (Oral)   Ht 5\' 3"  (1.6 m)   Wt 179 lb 3.2 oz (81.3 kg)   LMP 04/05/1982   BMI 31.74 kg/m  Gen: NAD, alert, cooperative with exam HEENT: NCAT CV: RRR, good S1/S2 Resp: CTABL, no wheezes, non-labored Ext: No edema, warm Neuro: Alert and oriented, No gross deficits  Assessment and plan:  # 5 her myalgia Stable Very minimal improvement with Effexor but so far Increase Effexor to 75 mg, discussed unlikely to see improvement for 6-12 weeks.  # Anxiety Stable Continue titrating Effexor, tolerating well  # chronic anticoagulation Supratherapeutic today, no signs of bleeding Reduced dose by 2 mg per week.  Indication- hypercoagulable state with Hx of DVT Change to 4 mg Coumadin daily, previously 4 mg daily with 6 mg on Mondays One-month follow-up with clinical pharmacist   Murtis SinkSam Dereon Corkery, MD [facility] 11/24/2015, 3:49 PM

## 2015-11-24 NOTE — Patient Instructions (Signed)
Great to see you!  Decrease coumadin to 4 mg daily, no more extra 1/2 pill.   Increase effexor to 75 mg once daily.  Come back in 2 months to see me, come back toi see Tammy in 1 month

## 2015-11-25 ENCOUNTER — Encounter: Payer: Self-pay | Admitting: Pharmacist

## 2015-11-29 ENCOUNTER — Telehealth: Payer: Self-pay | Admitting: Family Medicine

## 2015-11-30 NOTE — Telephone Encounter (Signed)
This is not available currewntly for me to review. Can we call [facility] and ask for a reports?  Murtis SinkSam Dawna Jakes, MD [facility] 11/30/2015, 9:03 AM

## 2015-11-30 NOTE — Telephone Encounter (Signed)
Called [facility] in [place] to have Dexa scan report to be sent to our office.

## 2015-12-27 ENCOUNTER — Encounter: Payer: Self-pay | Admitting: Pharmacist

## 2015-12-30 ENCOUNTER — Ambulatory Visit (INDEPENDENT_AMBULATORY_CARE_PROVIDER_SITE_OTHER): Payer: Medicare Other | Admitting: Pharmacist

## 2015-12-30 DIAGNOSIS — Z86718 Personal history of other venous thrombosis and embolism: Secondary | ICD-10-CM

## 2015-12-30 DIAGNOSIS — Z7901 Long term (current) use of anticoagulants: Secondary | ICD-10-CM

## 2015-12-30 DIAGNOSIS — Z86711 Personal history of pulmonary embolism: Secondary | ICD-10-CM

## 2015-12-30 LAB — COAGUCHEK XS/INR WAIVED
INR: 1.5 — AB (ref 0.9–1.1)
PROTHROMBIN TIME: 18.5 s

## 2015-12-30 NOTE — Patient Instructions (Signed)
Anticoagulation Dose Instructions as of 12/30/2015      Sherry SmilesSun Mon Tue Wed Thu Fri Sat   New Dose 4 mg 4 mg 4 mg 4 mg 4 mg 4 mg 4 mg    Description   Take 1 and 1/2 tablets today (12/14/) and tomorrow (12/15).  Then continue current warfarin 4mg  tablet dose - take 1 tablet daily    INR was 1.5 today - too thick

## 2016-01-14 ENCOUNTER — Ambulatory Visit (INDEPENDENT_AMBULATORY_CARE_PROVIDER_SITE_OTHER): Payer: Medicare Other | Admitting: Pharmacist

## 2016-01-14 DIAGNOSIS — Z86711 Personal history of pulmonary embolism: Secondary | ICD-10-CM | POA: Diagnosis not present

## 2016-01-14 DIAGNOSIS — Z86718 Personal history of other venous thrombosis and embolism: Secondary | ICD-10-CM | POA: Diagnosis not present

## 2016-01-14 DIAGNOSIS — Z7901 Long term (current) use of anticoagulants: Secondary | ICD-10-CM | POA: Diagnosis not present

## 2016-01-14 LAB — COAGUCHEK XS/INR WAIVED
INR: 1.6 — AB (ref 0.9–1.1)
Prothrombin Time: 18.9 s

## 2016-01-14 NOTE — Patient Instructions (Signed)
Anticoagulation Dose Instructions as of 01/14/2016      Sherry SmilesSun Mon Tue Wed Thu Fri Sat   New Dose 4 mg 6 mg 4 mg 4 mg 4 mg 4 mg 4 mg    Description   Take 1 and 1/2 tablets today (12/29) and tomorrow (12/30). Then start taking 1 tablet daily, except on Monday take 1 and 1/2 tablets. If this increases INR >3.0 then consider adding Vit K foods to diet.  INR today 1.6 (too thick)

## 2016-01-27 ENCOUNTER — Ambulatory Visit: Payer: Medicare Other | Admitting: Family Medicine

## 2016-01-31 ENCOUNTER — Other Ambulatory Visit: Payer: Self-pay | Admitting: Family Medicine

## 2016-02-01 ENCOUNTER — Encounter (INDEPENDENT_AMBULATORY_CARE_PROVIDER_SITE_OTHER): Payer: Self-pay

## 2016-02-01 ENCOUNTER — Ambulatory Visit (INDEPENDENT_AMBULATORY_CARE_PROVIDER_SITE_OTHER): Payer: Medicare Other | Admitting: Family Medicine

## 2016-02-01 ENCOUNTER — Encounter: Payer: Self-pay | Admitting: Family Medicine

## 2016-02-01 VITALS — BP 132/76 | HR 107 | Temp 97.6°F | Ht 63.0 in | Wt 181.8 lb

## 2016-02-01 DIAGNOSIS — M479 Spondylosis, unspecified: Secondary | ICD-10-CM | POA: Diagnosis not present

## 2016-02-01 DIAGNOSIS — Z86711 Personal history of pulmonary embolism: Secondary | ICD-10-CM

## 2016-02-01 DIAGNOSIS — T887XXA Unspecified adverse effect of drug or medicament, initial encounter: Secondary | ICD-10-CM

## 2016-02-01 DIAGNOSIS — Z86718 Personal history of other venous thrombosis and embolism: Secondary | ICD-10-CM

## 2016-02-01 DIAGNOSIS — Z7901 Long term (current) use of anticoagulants: Secondary | ICD-10-CM

## 2016-02-01 DIAGNOSIS — T50905A Adverse effect of unspecified drugs, medicaments and biological substances, initial encounter: Secondary | ICD-10-CM

## 2016-02-01 DIAGNOSIS — M797 Fibromyalgia: Secondary | ICD-10-CM

## 2016-02-01 LAB — COAGUCHEK XS/INR WAIVED
INR: 1.5 — AB (ref 0.9–1.1)
PROTHROMBIN TIME: 18.1 s

## 2016-02-01 MED ORDER — DULOXETINE HCL 30 MG PO CPEP: 30.0000 mg | ORAL_CAPSULE | Freq: Every day | ORAL | 0 refills | Status: DC

## 2016-02-01 MED ORDER — DULOXETINE HCL 60 MG PO CPEP: 60.0000 mg | ORAL_CAPSULE | Freq: Every day | ORAL | 1 refills | Status: DC

## 2016-02-01 NOTE — Patient Instructions (Signed)
Great to see you!  Increase your coumadin to 4 mg daily and 6 mg (1 and 1/2 pill) on fridays.   Come back to see tammy in 2 weeks for an INR  Start cymbalta 30 mg daily for 1 week, then 60 mg daily.   Come back to me in 3-4 weeks.

## 2016-02-01 NOTE — Progress Notes (Signed)
   HPI  Patient presents today with pain and needing an INR check.  Patient complains of several pains including lumbar back pain and neck pain. She states these are stable for 4+ years. She states she's recently been seen by neurosurgeon and told she has fibromyalgia. She was improving slightly with Effexor, however she had elevated blood pressures which have normalized after stopping.  She previously tolerated Cymbalta but did not have complete resolution of symptoms with it.  She has an appointment with rheumatology to discuss fibromyalgia more completely. She wants to avoid going to the pain clinic if possible.  Anticoagulation Patient chronically anticoagulated with hypercoagulable state and history of DVT with PE. Her son died from a pulmonary embolism and had a similar genetic mutation.  PMH: Smoking status noted ROS: Per HPI  Objective: BP 132/76   Pulse (!) 107   Temp 97.6 F (36.4 C) (Oral)   Ht 5\' 3"  (1.6 m)   Wt 181 lb 12.8 oz (82.5 kg)   LMP 04/05/1982   BMI 32.20 kg/m  Gen: NAD, alert, cooperative with exam HEENT: NCAT CV: RRR, good S1/S2, no murmur Resp: CTABL, no wheezes, non-labored Ext: No edema, warm Neuro: Alert and oriented, No gross deficits  INR 1.5  Assessment and plan:  # Fibromyalgia, osteoarthritis of the spine With multiple other pain complaints as well including lumbar back pain and cervical spine pain. Stable for several years but worsening without treatment.  Restart Cymbalta, will likely need to titrate above 60 mg 30 mg 1 week, 60 mg 2-3 weeks then follow-up  Medication reaction Hypertensive reaction to effexor- now resolved off of effexor for a few weeks.   # Chronic anticoagulation, history of pulmonary embolism, history of DVT Increase Coumadin from 4 mg daily to 4 mg daily and 6 mg on Friday, increase total weekly dose by 2 mg ( 7% increase) Re-check in 2 weeks, Pt did not increase to 6 mg on last recommendations.     Meds  ordered this encounter  Medications  . DULoxetine (CYMBALTA) 30 MG capsule    Sig: Take 1 capsule (30 mg total) by mouth daily.    Dispense:  7 capsule    Refill:  0  . DULoxetine (CYMBALTA) 60 MG capsule    Sig: Take 1 capsule (60 mg total) by mouth daily.    Dispense:  30 capsule    Refill:  1    Murtis SinkSam Bradshaw, MD [facility] 02/01/2016, 4:33 PM

## 2016-02-14 ENCOUNTER — Ambulatory Visit (INDEPENDENT_AMBULATORY_CARE_PROVIDER_SITE_OTHER): Payer: Medicare Other | Admitting: Family Medicine

## 2016-02-14 ENCOUNTER — Emergency Department (HOSPITAL_COMMUNITY): Payer: Medicare Other

## 2016-02-14 ENCOUNTER — Encounter (HOSPITAL_COMMUNITY): Payer: Self-pay | Admitting: Emergency Medicine

## 2016-02-14 ENCOUNTER — Emergency Department (HOSPITAL_COMMUNITY)
Admission: EM | Admit: 2016-02-14 | Discharge: 2016-02-14 | Disposition: A | Discharge status: Home / Self Care | Payer: Medicare Other | Attending: Emergency Medicine | Admitting: Emergency Medicine

## 2016-02-14 ENCOUNTER — Encounter: Payer: Self-pay | Admitting: Family Medicine

## 2016-02-14 VITALS — BP 136/86 | HR 110 | Temp 97.6°F

## 2016-02-14 VITALS — BP 136/87 | HR 77 | Temp 97.6°F

## 2016-02-14 DIAGNOSIS — R42 Dizziness and giddiness: Secondary | ICD-10-CM

## 2016-02-14 DIAGNOSIS — M546 Pain in thoracic spine: Secondary | ICD-10-CM | POA: Diagnosis not present

## 2016-02-14 DIAGNOSIS — Z86718 Personal history of other venous thrombosis and embolism: Secondary | ICD-10-CM

## 2016-02-14 DIAGNOSIS — R5383 Other fatigue: Secondary | ICD-10-CM | POA: Diagnosis present

## 2016-02-14 DIAGNOSIS — R61 Generalized hyperhidrosis: Secondary | ICD-10-CM | POA: Diagnosis not present

## 2016-02-14 DIAGNOSIS — Z86711 Personal history of pulmonary embolism: Secondary | ICD-10-CM

## 2016-02-14 DIAGNOSIS — Z7901 Long term (current) use of anticoagulants: Secondary | ICD-10-CM | POA: Insufficient documentation

## 2016-02-14 DIAGNOSIS — R531 Weakness: Secondary | ICD-10-CM | POA: Diagnosis not present

## 2016-02-14 DIAGNOSIS — R791 Abnormal coagulation profile: Secondary | ICD-10-CM | POA: Diagnosis not present

## 2016-02-14 DIAGNOSIS — R0602 Shortness of breath: Secondary | ICD-10-CM | POA: Diagnosis not present

## 2016-02-14 DIAGNOSIS — R002 Palpitations: Secondary | ICD-10-CM | POA: Diagnosis not present

## 2016-02-14 LAB — COMPREHENSIVE METABOLIC PANEL
ALT: 36 U/L (ref 14–54)
ANION GAP: 7 (ref 5–15)
AST: 30 U/L (ref 15–41)
Albumin: 4.1 g/dL (ref 3.5–5.0)
Alkaline Phosphatase: 31 U/L — ABNORMAL LOW (ref 38–126)
BUN: 19 mg/dL (ref 6–20)
CHLORIDE: 102 mmol/L (ref 101–111)
CO2: 26 mmol/L (ref 22–32)
Calcium: 9.5 mg/dL (ref 8.9–10.3)
Creatinine, Ser: 0.89 mg/dL (ref 0.44–1.00)
GFR calc non Af Amer: >60 mL/min (ref >60)
Glucose, Bld: 107 mg/dL — ABNORMAL HIGH (ref 65–99)
POTASSIUM: 4.2 mmol/L (ref 3.5–5.1)
SODIUM: 135 mmol/L (ref 135–145)
Total Bilirubin: 0.5 mg/dL (ref 0.3–1.2)
Total Protein: 8.2 g/dL — ABNORMAL HIGH (ref 6.5–8.1)

## 2016-02-14 LAB — URINALYSIS, ROUTINE W REFLEX MICROSCOPIC
BACTERIA UA: NONE SEEN
BILIRUBIN URINE: NEGATIVE
Glucose, UA: NEGATIVE mg/dL
KETONES UR: NEGATIVE mg/dL
NITRITE: NEGATIVE
Protein, ur: NEGATIVE mg/dL
SPECIFIC GRAVITY, URINE: 1.009 (ref 1.005–1.030)
pH: 5 (ref 5.0–8.0)

## 2016-02-14 LAB — TROPONIN I

## 2016-02-14 LAB — CBC
HEMATOCRIT: 43.1 % (ref 36.0–46.0)
HEMOGLOBIN: 14.6 g/dL (ref 12.0–15.0)
MCH: 33.3 pg (ref 26.0–34.0)
MCHC: 33.9 g/dL (ref 30.0–36.0)
MCV: 98.4 fL (ref 78.0–100.0)
Platelets: 253 10*3/uL (ref 150–400)
RBC: 4.38 MIL/uL (ref 3.87–5.11)
RDW: 12.7 % (ref 11.5–15.5)
WBC: 14.2 10*3/uL — ABNORMAL HIGH (ref 4.0–10.5)

## 2016-02-14 LAB — COAGUCHEK XS/INR WAIVED
INR: 4.6 — ABNORMAL HIGH (ref 0.9–1.1)
Prothrombin Time: 54.6 s

## 2016-02-14 LAB — PROTIME-INR
INR: 3.64
PROTHROMBIN TIME: 37.1 s — AB (ref 11.4–15.2)

## 2016-02-14 LAB — LIPASE, BLOOD: Lipase: 55 U/L — ABNORMAL HIGH (ref 11–51)

## 2016-02-14 LAB — GLUCOSE HEMOCUE WAIVED: GLU HEMOCUE WAIVED: 91 mg/dL (ref 65–99)

## 2016-02-14 NOTE — ED Provider Notes (Signed)
[facility]-EMERGENCY DEPT Provider Note   CSN: [id number] Arrival date & time: 02/14/16  1710 By signing my name below, I, Levon Hedger, attest that this documentation has been prepared under the direction and in the presence of non-physician practitioner, Melvenia Beam A Cainen Burnham PA-C. Electronically Signed: Levon Hedger, Scribe. 02/14/2016. 9:20 PM.   History   Chief Complaint Chief Complaint  Patient presents with  . Fatigue  . Spasms    HPI Sherry Levine is a 71 y.o. female with a PMHx of DVT and PE, on Coumadin, who presents to the Emergency Department complaining of recurrent, sudden onset, gradually improving lightheadedness onset this afternoon. Pt was heading to her PCP for an INR recheck when she sudden felt lightheaded. She notes associated  pallor, nausea, back pain, intermittent heart palpitations, and fatigue. Lightheadedness is exacerbated by changing position. She has taken lorazepam with no reported relief. Pt states she has had these episodes daily for 2 months, but today the episode lasted longer than normal. During exam, pt feels "weak and shaky", but states she has improved. Pt denies fever, cough, vomiting, syncope, dysuria, difficulty urinating, chest pain, chest tightness, or leg swelling.   The history is provided by the patient. No language interpreter was used.   Past Medical History:  Diagnosis Date  . Allergy   . Anxiety   . Clotting disorder (HCC)   . Diverticulitis   . DVT (deep venous thrombosis) (HCC)   . Fibromyalgia   . Gout   . Osteoarthritis   . Osteoarthritis (arthritis due to wear and tear of joints)   . Plantar fasciitis   . Pulmonary embolism (HCC)   . Thyroid disease     Patient Active Problem List   Diagnosis Date Noted  . History of pulmonary embolism 07/22/2015  . History of DVT (deep vein thrombosis) 07/22/2015  . Acute diverticulitis 06/30/2015  . Diverticulitis 06/30/2015  . Chronic anticoagulation 06/18/2015  . 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 Prothrombin mutation (HCC) 04/04/2012  . Acute pulmonary embolism (HCC) 04/04/2012  . Obstructive sleep apnea 05/08/2011  . Allergic rhinitis 05/01/2011  . Anxiety state 01/23/2011  . 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 Insomnia 11/16/2010  . Primary hypercoagulable state (HCC) 11/11/2010  . 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 GERD 03/21/2007  . PEPTIC STRICTURE 03/21/2007  . HIATAL HERNIA 03/21/2007  . IBS 03/21/2007  . ARTHRITIS 03/21/2007  . Osteoarthritis of spine 03/21/2007  . DISC DISEASE, CERVICAL 03/21/2007  . Fibromyalgia 03/21/2007    Past Surgical History:  Procedure Laterality Date  . ABDOMINAL HYSTERECTOMY    . carpel tunnel     right hand  . CERVICAL FUSION    . CHOLECYSTECTOMY  2015  . EYE SURGERY Bilateral    cataracts  . KNEE ARTHROSCOPY WITH LATERAL MENISECTOMY     right knee    OB History    No data available       Home Medications    Prior to Admission medications   Medication Sig Start Date End Date Taking? Authorizing Provider  acetaminophen (TYLENOL) 500 MG tablet Take 1,000 mg by mouth every 6 (six) hours.    Yes Historical Provider, MD  cetirizine (ZYRTEC) 10 MG tablet TAKE 1 TABLET BY MOUTH EVERY DAY   Yes Deatra Canter, FNP  DULoxetine (CYMBALTA) 60 MG capsule Take 1 capsule (60 mg total) by mouth daily. 02/01/16  Yes Elenora Gamma, MD  fluticasone (FLONASE) 50 MCG/ACT nasal spray Place 1 spray into  both nostrils daily.    Yes Historical Provider, MD  levothyroxine (SYNTHROID, LEVOTHROID) 50 MCG tablet TAKE 1 TABLET BY MOUTH EVERY DAY 01/14/13  Yes Ernestina Pennaonald W Moore, MD  LORazepam (ATIVAN) 1 MG tablet Take 0.5-1 tablets (0.5-1 mg total) by mouth at bedtime as needed for anxiety. 10/15/15  Yes Elenora GammaSamuel L Bradshaw, MD  pantoprazole (PROTONIX) 40 MG tablet Take 40 mg by mouth daily.   Yes Historical Provider, MD  polyethylene glycol (MIRALAX / GLYCOLAX) packet Take 17 g by mouth daily.  07/05/15  Yes Meredeth IdeGagan S Lama, MD  rosuvastatin (CRESTOR) 5 MG tablet Take 5 mg by mouth every other day. 09/03/15  Yes Historical Provider, MD  warfarin (COUMADIN) 4 MG tablet Take 4 mg by mouth daily.  08/18/15  Yes Henrene Pastorammy Eckard, PharmD  DULoxetine (CYMBALTA) 30 MG capsule Take 1 capsule (30 mg total) by mouth daily. Patient not taking: Reported on 02/14/2016 02/01/16   Elenora GammaSamuel L Bradshaw, MD    Family History Family History  Problem Relation Age of Onset  . Stroke Mother   . Heart disease Mother   . Stroke Father   . Heart disease Father     Social History Social History  Substance Use Topics  . Smoking status: Never Smoker  . Smokeless tobacco: Never Used  . Alcohol use No     Allergies   Celebrex [celecoxib]; Colchicine; Lyrica [pregabalin]; Omnicef [cefdinir]; Penicillins; Zithromax [azithromycin]; and Codeine   Review of Systems Review of Systems  Constitutional: Negative for fever.  Eyes: Negative for visual disturbance.  Respiratory: Negative for chest tightness.   Cardiovascular: Positive for palpitations. Negative for chest pain and leg swelling.  Gastrointestinal: Positive for nausea. Negative for vomiting.  Genitourinary: Negative for difficulty urinating and dysuria.  Musculoskeletal: Positive for back pain.  Skin: Positive for pallor.  Neurological: Positive for light-headedness. Negative for syncope.  Hematological: Bruises/bleeds easily.    Physical Exam Updated Vital Signs BP 118/73   Pulse 62   Temp 98.3 F (36.8 C) (Oral)   Resp 20   LMP 04/05/1982   SpO2 100%   Physical Exam  Constitutional: She is oriented to person, place, and time. She appears well-developed and well-nourished. No distress.  HENT:  Head: Normocephalic and atraumatic.  Eyes: Conjunctivae are normal.  Cardiovascular: Normal rate.   No murmur heard. Pulmonary/Chest: Effort normal. She has no wheezes. She has no rales.  Abdominal: She exhibits no distension. There is no  tenderness.  Musculoskeletal: She exhibits no edema.  Neurological: She is alert and oriented to person, place, and time.  Skin: Skin is warm and dry.  Psychiatric: She has a normal mood and affect.  Nursing note and vitals reviewed.   ED Treatments / Results  DIAGNOSTIC STUDIES:  Oxygen Saturation is 99% on RA, normal by my interpretation.    COORDINATION OF CARE:  9:12 PM Discussed treatment plan with pt at bedside and pt agreed to plan.   Labs (all labs ordered are listed, but only abnormal results are displayed) Labs Reviewed  CBC - Abnormal; Notable for the following:       Result Value   WBC 14.2 (*)    All other components within normal limits  URINALYSIS, ROUTINE W REFLEX MICROSCOPIC - Abnormal; Notable for the following:    Color, Urine STRAW (*)    Hgb urine dipstick MODERATE (*)    Leukocytes, UA TRACE (*)    Squamous Epithelial / LPF 0-5 (*)    All other components within normal limits  COMPREHENSIVE METABOLIC PANEL - Abnormal; Notable for the following:    Glucose, Bld 107 (*)    Total Protein 8.2 (*)    Alkaline Phosphatase 31 (*)    All other components within normal limits  LIPASE, BLOOD - Abnormal; Notable for the following:    Lipase 55 (*)    All other components within normal limits  PROTIME-INR - Abnormal; Notable for the following:    Prothrombin Time 37.1 (*)    All other components within normal limits  TROPONIN I    EKG  EKG Interpretation  Date/Time:  Monday February 14 2016 21:33:49 EST Ventricular Rate:  61 PR Interval:    QRS Duration: 76 QT Interval:  401 QTC Calculation: 404 R Axis:   -21 Text Interpretation:  Sinus rhythm Borderline left axis deviation RSR' in V1 or V2, probably normal variant Minimal ST elevation, anterior leads Confirmed by COOK  MD, BRIAN ([id number]) on 02/14/2016 10:04:21 PM       Radiology No results found.  Procedures Procedures (including critical care time)  Medications Ordered in ED Medications - No  data to display   Initial Impression / Assessment and Plan / ED Course  I have reviewed the triage vital signs and the nursing notes.  Pertinent labs & imaging results that were available during my care of the patient were reviewed by me and considered in my medical decision making (see chart for details).     Patient presents with episode of dizziness/lightheadedness that occurred earlier. She reports daily episodes that her doctor is evaluating. No pain, fever, SOB. She is currently improved and resting comfortably.   EKG unchanged. Labs reassuring. She is ambulated without symptom recurrence and feels back to her baseline. She is felt stable for discharge home.   Final Clinical Impressions(s) / ED Diagnoses   Final diagnoses:  None   1. Weakness  New Prescriptions New Prescriptions   No medications on file  I personally performed the services described in this documentation, which was scribed in my presence. The recorded information has been reviewed and is accurate.      Elpidio Anis, PA-C 02/21/16 0400    Donnetta Hutching, MD 02/21/16 1536

## 2016-02-14 NOTE — ED Notes (Signed)
Upon finding out she was going to the lobby, the Pt began reporting that [facility]  Sts "my doctor thought I was having a heart attack."  EMS reports they were told nothing about a cardiac complaint by PCP office.  Denies cardiac symptoms (chest pain, SOB, upper back pain, neck/jaw/arm pain).

## 2016-02-14 NOTE — Progress Notes (Signed)
BP 136/86 (BP Location: Left Arm, Patient Position: Sitting, Cuff Size: Normal)   Pulse (!) 110   Temp 97.6 F (36.4 C) (Oral)   LMP 04/05/1982   SpO2 99%    Subjective:    Patient ID: Sherry Levine, female    DOB: 01-26-1945, 71 y.o.   MRN: [id number]  HPI: Sherry SaxBetty C Rorke is a 71 y.o. female presenting on 02/14/2016 for Extremity Weakness (weakness, nausea sudden onset)   HPI Upper back pain and weakness and nausea and diaphoresis it's been going on for the past couple hours. She initially started having this when she was at the grocery store and she had not eaten earlier today that she ate something and came here to the office for appointment to get an INR recheck. She feels like the shakiness in the diaphoresis has not improved. Other changes is that she is recently increased her dose of Cymbalta and she says she did have similar symptoms when she had been on a higher dose of Effexor but had not had it on the lower dose of Cymbalta so she does not know if it's related to that or not. She increased her dose of Cymbalta 3 days ago to 60 mg. She says she feels short of breath and then has back pain between her shoulder blades that's also been going on during this time. She says the pain is still there and is described as a pressure sensation. She also feels very shaky and jittery. Blood glucose here in the office is 99 and she ate about 90 minutes ago. She denies any signs of bleeding.  Relevant past medical, surgical, family and social history reviewed and updated as indicated. Interim medical history since our last visit reviewed. Allergies and medications reviewed and updated.  Review of Systems  Constitutional: Positive for diaphoresis and fatigue. Negative for chills and fever.  HENT: Negative for congestion, ear discharge and ear pain.   Respiratory: Positive for shortness of breath. Negative for chest tightness.   Cardiovascular: Negative for chest pain and leg swelling.    Gastrointestinal: Negative for abdominal pain, diarrhea and vomiting.  Genitourinary: Negative for difficulty urinating and dysuria.  Musculoskeletal: Positive for back pain and myalgias. Negative for gait problem.  Skin: Negative for color change and rash.  Neurological: Positive for tremors and weakness. Negative for light-headedness and headaches.  Psychiatric/Behavioral: Negative for agitation and behavioral problems.  All other systems reviewed and are negative.   Per HPI unless specifically indicated above   Allergies as of 02/14/2016      Reactions   Colchicine Nausea Only   Lyrica [pregabalin]    MAKES HER CRAZY   Omnicef [cefdinir]    CANT REMEMBER   Penicillins Itching   Has patient had a PCN reaction causing immediate rash, facial/tongue/throat swelling, SOB or lightheadedness with hypotension: No Has patient had a PCN reaction causing severe rash involving mucus membranes or skin necrosis: No Has patient had a PCN reaction that required hospitalization No Has patient had a PCN reaction occurring within the last 10 years: No If all of the above answers are "NO", then may proceed with Cephalosporin use.   Zithromax [azithromycin] Nausea And Vomiting   Celebrex [celecoxib] Hives, Itching, Rash   Codeine Itching, Rash   throat swells. States that she tolerates vicodin      Medication List       Accurate as of 02/14/16  4:19 PM. Always use your most recent med list.  acetaminophen 500 MG tablet Commonly known as:  TYLENOL Take 1,000 mg by mouth every 6 (six) hours as needed for mild pain.   BESIVANCE 0.6 % Susp Generic drug:  Besifloxacin HCl   cetirizine 10 MG tablet Commonly known as:  ZYRTEC TAKE 1 TABLET BY MOUTH EVERY DAY   DULoxetine 30 MG capsule Commonly known as:  CYMBALTA Take 1 capsule (30 mg total) by mouth daily.   DULoxetine 60 MG capsule Commonly known as:  CYMBALTA Take 1 capsule (60 mg total) by mouth daily.   DUREZOL 0.05 %  Emul Generic drug:  Difluprednate   fluticasone 50 MCG/ACT nasal spray Commonly known as:  FLONASE Place 2 sprays into both nostrils daily.   levothyroxine 50 MCG tablet Commonly known as:  SYNTHROID, LEVOTHROID TAKE 1 TABLET BY MOUTH EVERY DAY   LORazepam 1 MG tablet Commonly known as:  ATIVAN Take 0.5-1 tablets (0.5-1 mg total) by mouth at bedtime as needed for anxiety.   pantoprazole 40 MG tablet Commonly known as:  PROTONIX Take 40 mg by mouth daily.   polyethylene glycol packet Commonly known as:  MIRALAX / GLYCOLAX Take 17 g by mouth daily.   PROLENSA 0.07 % Soln Generic drug:  Bromfenac Sodium   RESTASIS 0.05 % ophthalmic emulsion Generic drug:  cycloSPORINE   rosuvastatin 5 MG tablet Commonly known as:  CRESTOR Take 5 mg by mouth every other day.   warfarin 4 MG tablet Commonly known as:  COUMADIN Take 6mg  on sundays and wednesdays.  Take 4mg  all other days.          Objective:    BP 136/86 (BP Location: Left Arm, Patient Position: Sitting, Cuff Size: Normal)   Pulse (!) 110   Temp 97.6 F (36.4 C) (Oral)   LMP 04/05/1982   SpO2 99%   Wt Readings from Last 3 Encounters:  02/01/16 181 lb 12.8 oz (82.5 kg)  11/24/15 179 lb 3.2 oz (81.3 kg)  10/15/15 176 lb 12.8 oz (80.2 kg)    Physical Exam  Constitutional: She is oriented to person, place, and time. She appears well-developed and well-nourished. No distress.  HENT:  Right Ear: External ear normal.  Left Ear: External ear normal.  Nose: Nose normal.  Mouth/Throat: Oropharynx is clear and moist.  Eyes: Conjunctivae and EOM are normal. Pupils are equal, round, and reactive to light.  Cardiovascular: Normal rate, regular rhythm, normal heart sounds and intact distal pulses.   No murmur heard. Pulmonary/Chest: Effort normal and breath sounds normal. No respiratory distress. She has no wheezes. She has no rales.  Musculoskeletal: Normal range of motion. She exhibits no edema or tenderness.    Neurological: She is alert and oriented to person, place, and time. Coordination normal.  Skin: Skin is warm and dry. No rash noted. She is not diaphoretic.  Psychiatric: She has a normal mood and affect. Her behavior is normal.  Nursing note and vitals reviewed.   INR: 4.6  Reduce dosing but will need close follow-up after emergency department visit.   EKG: Normal sinus rhythm, nonspecific ST changes. No changes from previous EKG in August 2017.    Assessment & Plan:   Problem List Items Addressed This Visit    None    Visit Diagnoses    Diaphoresis    -  Primary   Relevant Orders   EKG 12-Lead   Shortness of breath       Shortness of breath and diaphoresis and shakes when not improved, blood sugar 99,  family history cardiac, will send to emergency department   Relevant Orders   EKG 12-Lead   Lightheadedness       Relevant Orders   EKG 12-Lead   Acute midline thoracic back pain       Elevated INR       No signs of bleeding, will reduce dosing but since she is going to the hospital she will just need close follow-up for this.      With patient's cardiac risk factors and family history and previous history of pulmonary embolism we have opted to send her to the emergency department for further evaluation and she will follow up closely with [place] after the emergency department. We just want to make sure that we can rule out any acute coronary syndrome.  Follow up plan: Return if symptoms worsen or fail to improve.  Counseling provided for all of the vaccine components Orders Placed This Encounter  Procedures  . EKG 12-Lead    Arville Care, MD [facility] 02/14/2016, 4:19 PM

## 2016-02-14 NOTE — Discharge Instructions (Signed)
CALL TO MAKE AN APPOINTMENT WITH [facility] FOR FURTHER OUTPATIENT EVALUATION OF SYMPTOMS. RETURN HERE IF YOU HAVE WORSENING SYMPTOMS OR NEW CONCERN.

## 2016-02-14 NOTE — ED Notes (Signed)
Main lab called to request recollect CMP and Lipse on pt.  Pt currently in lobby and will be recollected when pt is roomed.

## 2016-02-14 NOTE — Addendum Note (Signed)
Addended by: Angela AdamOSTOSKY, JESSICA C on: 02/14/2016 05:24 PM   Modules accepted: Orders

## 2016-02-14 NOTE — ED Notes (Signed)
Per EMS pt complaint of continued fatigue, nausea, and bladder spasms for two months. Pt denies chest pain.

## 2016-02-14 NOTE — ED Notes (Signed)
[facility] charge nurse verbalizes to pt must inform staff if decides to leave from lobby to have IV removed.

## 2016-02-15 ENCOUNTER — Telehealth: Payer: Self-pay | Admitting: Family Medicine

## 2016-02-15 NOTE — Telephone Encounter (Signed)
Patient called to ask question about criteria for diabetes diagnosis

## 2016-02-15 NOTE — Telephone Encounter (Signed)
Patient called - recommended hold warfarin x 1 day, then decrease warfarin dose to 2mg  Wed and Sat and 4mg  all other days. Recheck INR in 1-2 weeks.

## 2016-02-18 ENCOUNTER — Encounter: Payer: Self-pay | Admitting: Family

## 2016-02-18 ENCOUNTER — Ambulatory Visit (INDEPENDENT_AMBULATORY_CARE_PROVIDER_SITE_OTHER): Payer: Medicare Other | Admitting: Family

## 2016-02-18 VITALS — BP 116/76 | HR 93 | Temp 98.2°F | Ht 63.0 in | Wt 180.0 lb

## 2016-02-18 DIAGNOSIS — J0100 Acute maxillary sinusitis, unspecified: Secondary | ICD-10-CM | POA: Diagnosis not present

## 2016-02-18 MED ORDER — DOXYCYCLINE HYCLATE 100 MG PO TABS: 100.0000 mg | ORAL_TABLET | Freq: Two times a day (BID) | ORAL | 0 refills | Status: DC

## 2016-02-18 NOTE — Patient Instructions (Signed)

## 2016-02-18 NOTE — Progress Notes (Signed)
   Subjective:    Patient ID: Sherry Levine, female    DOB: 1945/05/31, 71 y.o.   MRN: [id number]  Sinus Problem  This is a new problem. The current episode started 1 to 4 weeks ago. The problem has been gradually worsening since onset. There has been no fever. Her pain is at a severity of 10/10. The pain is mild. Associated symptoms include chills, congestion, ear pain, headaches, sinus pressure, sneezing and a sore throat. Pertinent negatives include no coughing. Past treatments include oral decongestants, lying down and spray decongestants. The treatment provided mild relief.      Review of Systems  Constitutional: Positive for chills.  HENT: Positive for congestion, ear pain, sinus pressure, sneezing and sore throat.   Respiratory: Negative for cough.   Neurological: Positive for headaches.  All other systems reviewed and are negative.      Objective:   Physical Exam  Constitutional: She is oriented to person, place, and time. She appears well-developed and well-nourished. No distress.  HENT:  Head: Normocephalic and atraumatic.  Right Ear: External ear normal.  Left Ear: External ear normal.  Nose: Mucosal edema and rhinorrhea present. Right sinus exhibits maxillary sinus tenderness and frontal sinus tenderness. Left sinus exhibits maxillary sinus tenderness and frontal sinus tenderness.  Mouth/Throat: Posterior oropharyngeal erythema present.  Eyes: Pupils are equal, round, and reactive to light.  Neck: Normal range of motion. Neck supple. No thyromegaly present.  Cardiovascular: Normal rate, regular rhythm, normal heart sounds and intact distal pulses.   No murmur heard. Pulmonary/Chest: Effort normal and breath sounds normal. No respiratory distress. She has no wheezes.  Abdominal: Soft. Bowel sounds are normal. She exhibits no distension. There is no tenderness.  Musculoskeletal: Normal range of motion. She exhibits no edema or tenderness.  Neurological: She is alert and  oriented to person, place, and time. She has normal reflexes. No cranial nerve deficit.  Skin: Skin is warm and dry.  Psychiatric: She has a normal mood and affect. Her behavior is normal. Judgment and thought content normal.  Vitals reviewed.     BP 116/76   Pulse 93   Temp 98.2 F (36.8 C)   Ht 5\' 3"  (1.6 m)   Wt 180 lb (81.6 kg)   LMP 04/05/1982   BMI 31.89 kg/m      Assessment & Plan:  1. Acute maxillary sinusitis, recurrence not specified - Take meds as prescribed - Use a cool mist humidifier  -Use saline nose sprays frequently -Saline irrigations of the nose can be very helpful if done frequently.  * 4X daily for 1 week*  * Use of a nettie pot can be helpful with this. Follow directions with this* -Force fluids -For any cough or congestion  Use plain Mucinex- regular strength or max strength is fine   * Children- consult with Pharmacist for dosing -For fever or aces or pains- take tylenol or ibuprofen appropriate for age and weight.  * for fevers greater than 101 orally you may alternate ibuprofen and tylenol every  3 hours. -Throat lozenges if help - doxycycline (VIBRA-TABS) 100 MG tablet; Take 1 tablet (100 mg total) by mouth 2 (two) times daily.  Dispense: 20 tablet; Refill: 0  *Keep appt for INR check up  Jannifer Rodneyhristy Leva Baine, FNP

## 2016-02-21 ENCOUNTER — Telehealth: Payer: Self-pay | Admitting: Family Medicine

## 2016-02-21 NOTE — Telephone Encounter (Signed)
appt made to check INR 02/24/16

## 2016-02-24 ENCOUNTER — Encounter: Payer: Self-pay | Admitting: Pharmacist

## 2016-02-25 ENCOUNTER — Ambulatory Visit (INDEPENDENT_AMBULATORY_CARE_PROVIDER_SITE_OTHER): Payer: Medicare Other | Admitting: Pharmacist

## 2016-02-25 DIAGNOSIS — Z86711 Personal history of pulmonary embolism: Secondary | ICD-10-CM | POA: Diagnosis not present

## 2016-02-25 DIAGNOSIS — Z7901 Long term (current) use of anticoagulants: Secondary | ICD-10-CM

## 2016-02-25 DIAGNOSIS — Z86718 Personal history of other venous thrombosis and embolism: Secondary | ICD-10-CM

## 2016-02-25 LAB — COAGUCHEK XS/INR WAIVED
INR: 1.4 — ABNORMAL HIGH (ref 0.9–1.1)
PROTHROMBIN TIME: 16.6 s

## 2016-02-25 NOTE — Patient Instructions (Signed)
Anticoagulation Dose Instructions as of 02/25/2016      Glynis SmilesSun Mon Tue Wed Thu Fri Sat   New Dose 4 mg 4 mg 4 mg 2 mg 4 mg 4 mg 4 mg    Description   Take 1 extra tablet today (2/9) then take 1.5 tablets tomorrow (2/10).  Then increase warfarin dose to 1/2 tablet on Wednesdays. Take 1 tablet all other days.  Increasing dose due to being so low with INR of 1.4 even while taking an ABX (doxycycline) which should increase INR.   INR today is 1.4 (low, so blood is thick)

## 2016-02-27 ENCOUNTER — Other Ambulatory Visit: Payer: Self-pay | Admitting: Family Medicine

## 2016-03-01 ENCOUNTER — Other Ambulatory Visit: Payer: Self-pay | Admitting: Family Medicine

## 2016-03-01 NOTE — Telephone Encounter (Signed)
Also in note it said she needed to come back in 3-4 weeks to see him. I will continue the 60mg  cymbalta dose for now.

## 2016-03-01 NOTE — Telephone Encounter (Signed)
There was mention of titration up in the last OV note - bradshaw = please address

## 2016-03-01 NOTE — Telephone Encounter (Signed)
Patient aware and has a follow up appointment 2/19.

## 2016-03-02 MED ORDER — DULOXETINE HCL 30 MG PO CPEP: 30.0000 mg | ORAL_CAPSULE | Freq: Every day | ORAL | 5 refills | Status: DC

## 2016-03-02 NOTE — Telephone Encounter (Signed)
Rx written.   Murtis SinkSam Carissa Musick, MD [facility] 03/02/2016, 7:47 AM

## 2016-03-02 NOTE — Telephone Encounter (Signed)
Patient aware.

## 2016-03-06 ENCOUNTER — Ambulatory Visit: Payer: Medicare Other | Admitting: Family Medicine

## 2016-03-09 ENCOUNTER — Encounter: Payer: Self-pay | Admitting: Family Medicine

## 2016-03-09 ENCOUNTER — Ambulatory Visit (INDEPENDENT_AMBULATORY_CARE_PROVIDER_SITE_OTHER): Payer: Medicare Other | Admitting: Family Medicine

## 2016-03-09 VITALS — BP 109/75 | HR 84 | Temp 98.0°F | Ht 63.0 in | Wt 181.4 lb

## 2016-03-09 DIAGNOSIS — Z7901 Long term (current) use of anticoagulants: Secondary | ICD-10-CM

## 2016-03-09 DIAGNOSIS — M797 Fibromyalgia: Secondary | ICD-10-CM

## 2016-03-09 DIAGNOSIS — D72829 Elevated white blood cell count, unspecified: Secondary | ICD-10-CM | POA: Diagnosis not present

## 2016-03-09 DIAGNOSIS — Z86711 Personal history of pulmonary embolism: Secondary | ICD-10-CM | POA: Diagnosis not present

## 2016-03-09 DIAGNOSIS — Z86718 Personal history of other venous thrombosis and embolism: Secondary | ICD-10-CM

## 2016-03-09 DIAGNOSIS — Z8639 Personal history of other endocrine, nutritional and metabolic disease: Secondary | ICD-10-CM

## 2016-03-09 LAB — COAGUCHEK XS/INR WAIVED
INR: 1.1 (ref 0.9–1.1)
PROTHROMBIN TIME: 13.2 s

## 2016-03-09 MED ORDER — DULOXETINE HCL 60 MG PO CPEP: 60.0000 mg | ORAL_CAPSULE | Freq: Every day | ORAL | 3 refills | Status: DC

## 2016-03-09 NOTE — Patient Instructions (Signed)
Great to see you!  Increase your coumadin to 1 pill daily except wed and Saturday when you will take 1.5 pill.   Take 1 extra pill today

## 2016-03-09 NOTE — Progress Notes (Signed)
   HPI  Patient presents today follow-up for fatigue, ER follow-up, and anticoagulation follow-up.  Patient reports good warfarin adherence, no dietary indiscretion No bleeding.  Patient has a history of fibromyalgia as well as chronic fatigue, she feels the Cymbalta is helping, however she did not tolerate the 60 mg dose initially, she has just restarted 60 mg again last night.  Patient wonders if she has vitamin D deficiency, she has a history of vitamin D deficiency.   She did have an episode at our clinic which caused an ER visit, she's been seen by cardiology since that time and has not had any abnormalities found. He just turned in a 2 week Holter monitor one day ago.   PMH: Smoking status noted ROS: Per HPI  Objective: BP 109/75   Pulse 84   Temp 98 F (36.7 C) (Oral)   Ht 5\' 3"  (1.6 m)   Wt 181 lb 6.4 oz (82.3 kg)   LMP 04/05/1982   BMI 32.13 kg/m  Gen: NAD, alert, cooperative with exam HEENT: NCAT CV: RRR, good S1/S2, no murmur Resp: CTABL, no wheezes, non-labored Ext: No edema, warm Neuro: Alert and oriented, No gross deficits  Assessment and plan:  # , history of pulmonary embolism, chronic anticoagulation, prothrombin mutation INR is subtherapeutic today Recommended for extra milligrams of Coumadin today, increase total weekly dose by 2 mg, currently at 30 mg a week INR check with clinical pharmacist in 2 weeks Consider Xarelto, however I'm not sure out the data for F2 prothrombin mutations, will ask clinical pharmacist to investigate further.  # History of vitamin D deficiency Unlikely to be the cause of her symptoms, however rechecking labs  # Leukocytosis Seen in the ER on her recent visit Repeat labs  Fibromyalgia Improving slowly on Cymbalta, continue    Orders Placed This Encounter  Procedures  . VITAMIN D 25 Hydroxy (Vit-D Deficiency, Fractures)  . CBC with Differential/Platelet    Meds ordered this encounter  Medications  .  DULoxetine (CYMBALTA) 60 MG capsule    Sig: Take 1 capsule (60 mg total) by mouth daily.    Dispense:  30 capsule    Refill:  3    Murtis SinkSam Bradshaw, MD [facility] 03/09/2016, 5:19 PM

## 2016-03-10 ENCOUNTER — Other Ambulatory Visit: Payer: Self-pay | Admitting: Family Medicine

## 2016-03-10 DIAGNOSIS — E559 Vitamin D deficiency, unspecified: Secondary | ICD-10-CM | POA: Insufficient documentation

## 2016-03-10 LAB — CBC WITH DIFFERENTIAL/PLATELET
BASOS ABS: 0 10*3/uL (ref 0.0–0.2)
Basos: 0 %
EOS (ABSOLUTE): 0.1 10*3/uL (ref 0.0–0.4)
Eos: 1 %
HEMOGLOBIN: 13.1 g/dL (ref 11.1–15.9)
Hematocrit: 38.9 % (ref 34.0–46.6)
Immature Grans (Abs): 0 10*3/uL (ref 0.0–0.1)
Immature Granulocytes: 0 %
LYMPHS ABS: 2.6 10*3/uL (ref 0.7–3.1)
Lymphs: 37 %
MCH: 32.7 pg (ref 26.6–33.0)
MCHC: 33.7 g/dL (ref 31.5–35.7)
MCV: 97 fL (ref 79–97)
Monocytes Absolute: 0.5 10*3/uL (ref 0.1–0.9)
Monocytes: 8 %
NEUTROS ABS: 3.9 10*3/uL (ref 1.4–7.0)
Neutrophils: 54 %
PLATELETS: 223 10*3/uL (ref 150–379)
RBC: 4.01 x10E6/uL (ref 3.77–5.28)
RDW: 13.7 % (ref 12.3–15.4)
WBC: 7.2 10*3/uL (ref 3.4–10.8)

## 2016-03-10 LAB — VITAMIN D 25 HYDROXY (VIT D DEFICIENCY, FRACTURES): VIT D 25 HYDROXY: 23 ng/mL — AB (ref 30.0–100.0)

## 2016-03-10 MED ORDER — VITAMIN D (ERGOCALCIFEROL) 1.25 MG (50000 UNIT) PO CAPS: 50000.0000 [IU] | ORAL_CAPSULE | ORAL | 0 refills | Status: DC

## 2016-03-15 NOTE — Addendum Note (Signed)
Addended by: Henrene PastorECKARD, Doyl Bitting on: 03/15/2016 10:14 AM   Modules accepted: Level of Service

## 2016-03-16 ENCOUNTER — Telehealth: Payer: Self-pay | Admitting: Family Medicine

## 2016-03-16 ENCOUNTER — Other Ambulatory Visit: Payer: Self-pay | Admitting: Family Medicine

## 2016-03-16 DIAGNOSIS — R251 Tremor, unspecified: Secondary | ICD-10-CM | POA: Insufficient documentation

## 2016-03-16 MED ORDER — RIVAROXABAN 10 MG PO TABS: 10.0000 mg | ORAL_TABLET | Freq: Every day | ORAL | 11 refills | Status: DC

## 2016-03-16 NOTE — Telephone Encounter (Signed)
Labs placed to evaluate fully for diabetes.  Labs checked recently during hospitalization do not show signs of diabetes, however she did have some slightly elevated glucose at times.  A1c and BMP ordered.  Also per our recent discussion I have sent Xarelto to replace her Coumadin. We had an extensive conversation about this at our visit, after discussing with our clinical pharmacist I think it's a good change.  Murtis SinkSam Bradshaw, MD [facility] 03/16/2016, 12:14 PM

## 2016-03-16 NOTE — Telephone Encounter (Signed)
Patient reporting episode of shaking she contributes to low blood sugar, check BMP and A1c.  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 Changing to 10 mg once daily Xarelto per our conversation at a recent office visit.  Murtis SinkSam Josefita Weissmann, MD [facility] 03/16/2016, 12:13 PM

## 2016-03-16 NOTE — Telephone Encounter (Signed)
Pt notified of recommendation and lab order [facility]

## 2016-03-23 ENCOUNTER — Encounter: Payer: Medicare Other | Admitting: Pharmacist

## 2016-03-28 ENCOUNTER — Ambulatory Visit (INDEPENDENT_AMBULATORY_CARE_PROVIDER_SITE_OTHER): Payer: Medicare Other | Admitting: Pharmacist

## 2016-03-28 ENCOUNTER — Other Ambulatory Visit: Payer: Self-pay | Admitting: Family Medicine

## 2016-03-28 DIAGNOSIS — Z86718 Personal history of other venous thrombosis and embolism: Secondary | ICD-10-CM | POA: Diagnosis not present

## 2016-03-28 DIAGNOSIS — Z86711 Personal history of pulmonary embolism: Secondary | ICD-10-CM | POA: Diagnosis not present

## 2016-03-28 DIAGNOSIS — Z7901 Long term (current) use of anticoagulants: Secondary | ICD-10-CM

## 2016-03-28 DIAGNOSIS — R251 Tremor, unspecified: Secondary | ICD-10-CM

## 2016-03-28 LAB — BAYER DCA HB A1C WAIVED: HB A1C: 5.4 % (ref <7.0)

## 2016-03-28 LAB — COAGUCHEK XS/INR WAIVED
INR: 2.3 — AB (ref 0.9–1.1)
PROTHROMBIN TIME: 27 s

## 2016-03-29 LAB — BMP8+EGFR
BUN/Creatinine Ratio: 19 (ref 12–28)
BUN: 19 mg/dL (ref 8–27)
CALCIUM: 9.8 mg/dL (ref 8.7–10.3)
CHLORIDE: 101 mmol/L (ref 96–106)
CO2: 26 mmol/L (ref 18–29)
Creatinine, Ser: 1 mg/dL (ref 0.57–1.00)
GFR calc Af Amer: 65 mL/min/{1.73_m2} (ref >59)
GFR calc non Af Amer: 57 mL/min/{1.73_m2} — ABNORMAL LOW (ref >59)
GLUCOSE: 96 mg/dL (ref 65–99)
POTASSIUM: 4.9 mmol/L (ref 3.5–5.2)
SODIUM: 139 mmol/L (ref 134–144)

## 2016-04-07 ENCOUNTER — Ambulatory Visit: Payer: Medicare Other | Admitting: Family Medicine

## 2016-04-10 ENCOUNTER — Encounter: Payer: Self-pay | Admitting: Family Medicine

## 2016-04-11 ENCOUNTER — Telehealth: Payer: Self-pay | Admitting: Family Medicine

## 2016-04-11 MED ORDER — TRAZODONE HCL 100 MG PO TABS: 50.0000 mg | ORAL_TABLET | Freq: Every day | ORAL | 3 refills | Status: DC

## 2016-04-11 NOTE — Telephone Encounter (Signed)
Patient aware ok to start trazodone and instructions on how to stop lorazepam

## 2016-04-11 NOTE — Telephone Encounter (Signed)
OK to start trazodone, May begin lowering ativan dose but I would not recommend abrupt stop.   Recommend decrease to 1/2 pill daily for 1 month then 1/4 pill daily for 1 month then stop.   Murtis SinkSam Jerilynn Feldmeier, MD [facility] 04/11/2016, 2:45 PM

## 2016-04-11 NOTE — Telephone Encounter (Signed)
Multiple encounters open for patient.  This encounter will now be closed

## 2016-04-11 NOTE — Telephone Encounter (Signed)
Patient called stating that lorazepam is not helping her sleep and that husband takes trazodone.  Patient would like to know if she could switch to trazodone to see if it will help.

## 2016-04-11 NOTE — Telephone Encounter (Signed)
It is perfectly fine to use them together.   Murtis SinkSam Kia Stavros, MD [facility] 04/11/2016, 5:12 PM

## 2016-04-11 NOTE — Addendum Note (Signed)
Addended by: Elenora GammaBRADSHAW, SAMUEL L on: 04/11/2016 02:46 PM   Modules accepted: Orders

## 2016-04-11 NOTE — Telephone Encounter (Signed)
Patient called stating that she is a nervous about taking lorazepam and trazodone together while she is weaning off of lorazepam.  Patient states that lorazepam makes her a little sleepy but doesn't not help her stay asleep

## 2016-04-12 ENCOUNTER — Telehealth: Payer: Self-pay | Admitting: Family Medicine

## 2016-04-12 NOTE — Telephone Encounter (Signed)
Left patient message with details.

## 2016-04-12 NOTE — Telephone Encounter (Signed)
Patient asked if we could move AWV visit to 04/25/16 but there were not openings.  Left it for 05/04/16

## 2016-05-04 ENCOUNTER — Ambulatory Visit: Payer: Self-pay | Admitting: Pharmacist

## 2016-05-05 ENCOUNTER — Encounter: Payer: Medicare Other | Admitting: Pharmacist Clinician (PhC)/ Clinical Pharmacy Specialist

## 2016-05-08 ENCOUNTER — Encounter: Payer: Medicare Other | Admitting: Pharmacist

## 2016-05-09 ENCOUNTER — Ambulatory Visit (INDEPENDENT_AMBULATORY_CARE_PROVIDER_SITE_OTHER): Payer: Medicare Other | Admitting: Pharmacist

## 2016-05-09 DIAGNOSIS — Z86718 Personal history of other venous thrombosis and embolism: Secondary | ICD-10-CM

## 2016-05-09 DIAGNOSIS — Z7901 Long term (current) use of anticoagulants: Secondary | ICD-10-CM

## 2016-05-09 DIAGNOSIS — Z86711 Personal history of pulmonary embolism: Secondary | ICD-10-CM | POA: Diagnosis not present

## 2016-05-09 LAB — COAGUCHEK XS/INR WAIVED
INR: 4.4 — ABNORMAL HIGH (ref 0.9–1.1)
PROTHROMBIN TIME: 53.1 s

## 2016-05-09 NOTE — Patient Instructions (Signed)
Anticoagulation Dose Instructions as of 05/09/2016      Sherry Levine Tue Wed Thu Fri Sat   New Dose    Hold 2 mg 4 mg 6 mg   Alt Week 4 mg 4 mg 4 mg 4 mg 4 mg 4 mg 6 mg    Description   No warfarin today and take only 1/2 tablet tomorrow.  Then decrease warfarin dose to  tablets - take 1 and 1/2 tablets on Saturdays.  Take 1 tablet all other days.   INR was 4.4 today (too thin)

## 2016-05-19 ENCOUNTER — Ambulatory Visit: Payer: Medicare Other | Admitting: Family

## 2016-05-22 ENCOUNTER — Ambulatory Visit: Payer: Self-pay | Admitting: Pharmacist

## 2016-05-24 ENCOUNTER — Ambulatory Visit: Payer: Medicare Other | Admitting: Pediatrics

## 2016-05-25 ENCOUNTER — Ambulatory Visit: Payer: Self-pay | Admitting: Family Medicine

## 2016-05-29 ENCOUNTER — Other Ambulatory Visit: Payer: Self-pay | Admitting: Family Medicine

## 2016-05-29 NOTE — Telephone Encounter (Signed)
Phoned in.

## 2016-05-30 ENCOUNTER — Encounter: Payer: Self-pay | Admitting: Family Medicine

## 2016-05-30 ENCOUNTER — Ambulatory Visit (INDEPENDENT_AMBULATORY_CARE_PROVIDER_SITE_OTHER): Payer: Medicare Other | Admitting: Family Medicine

## 2016-05-30 ENCOUNTER — Ambulatory Visit (INDEPENDENT_AMBULATORY_CARE_PROVIDER_SITE_OTHER): Payer: Medicare Other

## 2016-05-30 ENCOUNTER — Other Ambulatory Visit: Payer: Self-pay | Admitting: Family Medicine

## 2016-05-30 VITALS — BP 116/77 | HR 93 | Temp 99.5°F | Ht 63.0 in | Wt 173.4 lb

## 2016-05-30 DIAGNOSIS — W19XXXA Unspecified fall, initial encounter: Secondary | ICD-10-CM

## 2016-05-30 DIAGNOSIS — Z7901 Long term (current) use of anticoagulants: Secondary | ICD-10-CM

## 2016-05-30 DIAGNOSIS — Z86718 Personal history of other venous thrombosis and embolism: Secondary | ICD-10-CM | POA: Diagnosis not present

## 2016-05-30 DIAGNOSIS — Z86711 Personal history of pulmonary embolism: Secondary | ICD-10-CM | POA: Diagnosis not present

## 2016-05-30 DIAGNOSIS — M25521 Pain in right elbow: Secondary | ICD-10-CM

## 2016-05-30 DIAGNOSIS — R519 Headache, unspecified: Secondary | ICD-10-CM

## 2016-05-30 DIAGNOSIS — M79641 Pain in right hand: Secondary | ICD-10-CM

## 2016-05-30 DIAGNOSIS — R51 Headache: Secondary | ICD-10-CM

## 2016-05-30 LAB — COAGUCHEK XS/INR WAIVED
INR: 2.6 — ABNORMAL HIGH (ref 0.9–1.1)
PROTHROMBIN TIME: 30.8 s

## 2016-05-30 MED ORDER — METHYLPREDNISOLONE ACETATE 80 MG/ML IJ SUSP
80.0000 mg | Freq: Once | INTRAMUSCULAR | Status: AC
  Administered 2016-05-30: 80 mg via INTRAMUSCULAR

## 2016-05-30 MED ORDER — DOXYCYCLINE HYCLATE 100 MG PO TABS: 100.0000 mg | ORAL_TABLET | Freq: Two times a day (BID) | ORAL | 0 refills | Status: DC

## 2016-05-30 NOTE — Progress Notes (Signed)
HPI  Patient presents today here for follow-up of chronic medical conditions.  Patient had a fall about 2 weeks ago, she states that she tripped over an object at home and fell landing on her right wrist and right elbow. Her right knee was also injured, however it's improved quite a bit. She states that her wrist and elbow are still sore and bothering her and she would like an x-ray.  Facial pain Patient states that she's had sinus pain and pressure, right eye pain, right temple pain with severe headache for the last 2-3 weeks off and on. She's also had cough, shortness of breath, and congestion. She also has some teeth pain has been associated with this. She's tolerating food and fluids by mouth normally. She denies any chest pain. Right temple, eye, and facial pain described as stabbing pain  Anticoagulation, history of DVT and PE. Patient has discussed with her vascular surgeon who agrees that she would do well on Xarelto. She would like to switch.   PMH: Smoking status noted ROS: Per HPI  Objective: BP 116/77   Pulse 93   Temp 99.5 F (37.5 C) (Oral)   Ht 5\' 3"  (1.6 m)   Wt 173 lb 6.4 oz (78.7 kg)   LMP 04/05/1982   BMI 30.72 kg/m  Gen: NAD, alert, cooperative with exam HEENT: NCAT, TMs normal bilaterally, positive facial pain and pressure in the frontal and maxillary sinuses on the right, tenderness over the right temple CV: RRR, good S1/S2, no murmur Resp: CTABL, no wheezes, non-labored Ext: No edema, warm Neuro: Alert and oriented, No gross deficits MSK Mild bruising is resolving on the right elbow, no pain with supination or pronation of the right wrist or flexion or extension.  Assessment and plan:  # Facial pain Most consistent with sinusitis, however considering her temple pain in association with a high I believe it's most prudent to go ahead and check a sedimentation rate to rule out temporal arteritis. 80 mg IM Depo-Medrol given, offered initially  prednisone, however she would like injection if she tolerates this better Doxycycline cover infectious etiology  # Fall Right wrist and elbow pain, will proceed with plain x-rays, doubt overt fracture.  # Chronic anticoagulation, history of PE, DVT. Transition from Coumadin to Xarelto. INR was 2.4 today, patient will take first Xarelto dose in 2 days.   Orders Placed This Encounter  Procedures  . DG Elbow 2 Views Right    Standing Status:   Future    Standing Expiration Date:   07/30/2017    Order Specific Question:   Reason for Exam (SYMPTOM  OR DIAGNOSIS REQUIRED)    Answer:   fall    Order Specific Question:   Preferred imaging location?    Answer:   Internal  . DG Hand Complete Right    Standing Status:   Future    Standing Expiration Date:   07/30/2017    Order Specific Question:   Reason for Exam (SYMPTOM  OR DIAGNOSIS REQUIRED)    Answer:   fall    Order Specific Question:   Preferred imaging location?    Answer:   Internal  . Sedimentation Rate  . CBC with Differential    Meds ordered this encounter  Medications  . XARELTO 10 MG TABS tablet    Sig: Take 1 tablet by mouth daily.    Refill:  11  . doxycycline (VIBRA-TABS) 100 MG tablet    Sig: Take 1 tablet (100 mg total) by  mouth 2 (two) times daily. 1 po bid    Dispense:  20 tablet    Refill:  0    Murtis Sink, MD [facility] 05/30/2016, 3:14 PM

## 2016-05-30 NOTE — Patient Instructions (Signed)
Great to see you!  Stop coumadin, start xarelto 2 days later, 1 pill once daily.   Start doxycyline today for your facial pain. We will be looking for the labs drawn today.

## 2016-05-31 LAB — CBC WITH DIFFERENTIAL/PLATELET
BASOS: 0 %
Basophils Absolute: 0 10*3/uL (ref 0.0–0.2)
EOS (ABSOLUTE): 0.1 10*3/uL (ref 0.0–0.4)
EOS: 2 %
HEMATOCRIT: 41.6 % (ref 34.0–46.6)
HEMOGLOBIN: 13.5 g/dL (ref 11.1–15.9)
IMMATURE GRANS (ABS): 0 10*3/uL (ref 0.0–0.1)
IMMATURE GRANULOCYTES: 0 %
LYMPHS: 29 %
Lymphocytes Absolute: 2.3 10*3/uL (ref 0.7–3.1)
MCH: 33 pg (ref 26.6–33.0)
MCHC: 32.5 g/dL (ref 31.5–35.7)
MCV: 102 fL — AB (ref 79–97)
MONOCYTES: 10 %
MONOS ABS: 0.8 10*3/uL (ref 0.1–0.9)
NEUTROS PCT: 59 %
Neutrophils Absolute: 4.7 10*3/uL (ref 1.4–7.0)
Platelets: 255 10*3/uL (ref 150–379)
RBC: 4.09 x10E6/uL (ref 3.77–5.28)
RDW: 13.3 % (ref 12.3–15.4)
WBC: 7.9 10*3/uL (ref 3.4–10.8)

## 2016-05-31 LAB — SEDIMENTATION RATE: Sed Rate: 33 mm/hr (ref 0–40)

## 2016-06-01 ENCOUNTER — Telehealth: Payer: Self-pay | Admitting: Family Medicine

## 2016-06-01 NOTE — Telephone Encounter (Signed)
What is the name of the medication? Lorazepam 1 mg  Have you contacted your pharmacy to request a refill? yes  Which pharmacy would you like this sent to? [facility]   Patient notified that their request is being sent to the clinical staff for review and that they should receive a call once it is complete. If they do not receive a call within 24 hours they can check with their pharmacy or our office.

## 2016-06-20 ENCOUNTER — Ambulatory Visit: Payer: Medicare Other | Admitting: Family Medicine

## 2016-06-26 ENCOUNTER — Ambulatory Visit: Payer: Self-pay | Admitting: Pharmacist

## 2016-07-06 ENCOUNTER — Ambulatory Visit: Payer: Self-pay | Admitting: Pharmacist

## 2016-07-07 ENCOUNTER — Encounter: Payer: Self-pay | Admitting: Family Medicine

## 2016-07-17 ENCOUNTER — Other Ambulatory Visit: Payer: Self-pay | Admitting: Family Medicine

## 2016-07-26 ENCOUNTER — Telehealth: Payer: Self-pay | Admitting: Pharmacist

## 2016-07-26 NOTE — Telephone Encounter (Signed)
Patient was still showing up in our anticaoguation clinic but she has been switched from warfarin to Xarelto.  She is doing well with no problems or concerns.  Continue Xarelto. And will remove anticoagulation clinic episode.

## 2016-08-10 ENCOUNTER — Encounter: Payer: Self-pay | Admitting: Family Medicine

## 2016-08-10 ENCOUNTER — Ambulatory Visit (INDEPENDENT_AMBULATORY_CARE_PROVIDER_SITE_OTHER): Payer: Medicare Other | Admitting: Family Medicine

## 2016-08-10 VITALS — BP 130/79 | HR 93 | Temp 98.4°F | Ht 63.0 in | Wt 175.4 lb

## 2016-08-10 DIAGNOSIS — G4700 Insomnia, unspecified: Secondary | ICD-10-CM

## 2016-08-10 DIAGNOSIS — F411 Generalized anxiety disorder: Secondary | ICD-10-CM

## 2016-08-10 MED ORDER — LORAZEPAM 1 MG PO TABS: 1.0000 mg | ORAL_TABLET | Freq: Every day | ORAL | 2 refills | Status: DC

## 2016-08-10 NOTE — Patient Instructions (Signed)
Great to see you!  You can try melatonin with the ativan if needed  Come back in 3 months unless you nee us sooner

## 2016-08-10 NOTE — Progress Notes (Signed)
   HPI  Patient presents today for follow-up of anxiety and difficulty sleeping.  Patient states there is a misunderstanding in our clinic and her Ativan was not refilled for the last 2 months. She has had periods of racing heart, feeling hot, and panic. She has not thought about this as anxiety, however she has not slept well either.  She tried trazodone which caused grogginess in the morning.  She has read online that Valium could be helpful this is what she requests today.  PMH: Smoking status noted ROS: Per HPI  Objective: BP 130/79   Pulse 93   Temp 98.4 F (36.9 C) (Oral)   Ht 5\' 3"  (1.6 m)   Wt 175 lb 6.4 oz (79.6 kg)   LMP 04/05/1982   BMI 31.07 kg/m  Gen: NAD, alert, cooperative with exam HEENT: NCAT CV: RRR, good S1/S2, no murmur Resp: CTABL, no wheezes, non-labored Ext: No edema, warm Neuro: Alert and oriented, No gross deficits  Assessment and plan:  # Insomnia Patient having very much difficulty sleeping. Has tried Benadryl, melatonin, trazodone-  they all seem to cause grogginess-  Discussed Valium is a poor choice after the age of 71, may restart Ativan. Also discussed utility of Restoril or belsomra  # anxiety Exacerbated without ativan, restart   Meds ordered this encounter  Medications  . LORazepam (ATIVAN) 1 MG tablet    Sig: Take 1 tablet (1 mg total) by mouth at bedtime.    Dispense:  30 tablet    Refill:  2    Murtis SinkSam Darroll Bredeson, MD [facility] 08/10/2016, 2:43 PM

## 2016-08-29 ENCOUNTER — Other Ambulatory Visit: Payer: Self-pay | Admitting: Family Medicine

## 2016-08-29 NOTE — Telephone Encounter (Signed)
Last Vit D 03/09/16  23.0 

## 2016-08-29 NOTE — Telephone Encounter (Signed)
I typically recommend 12 weeks of treatment with 50,000 units, then I transitioned to 2000 units of vitamin D3 daily.  Murtis SinkSam Devlin Mcveigh, MD [facility] 08/29/2016, 5:29 PM

## 2016-08-30 NOTE — Telephone Encounter (Signed)
Patient aware and verbalizes understanding. Patient states that her cardiologist recommenced that she get otc- qunol CO Q10 and ferrous gluconate 324mg  BID. Patient states she bought both but wants to make sure Dr. Ermalinda Levine ok's it before starting it. Please advise.

## 2016-08-31 NOTE — Telephone Encounter (Signed)
Patient aware.

## 2016-09-13 ENCOUNTER — Ambulatory Visit: Payer: Medicare Other | Admitting: *Deleted

## 2016-09-14 ENCOUNTER — Encounter: Payer: Self-pay | Admitting: Family Medicine

## 2016-09-18 ENCOUNTER — Other Ambulatory Visit: Payer: Self-pay | Admitting: Family Medicine

## 2016-09-27 ENCOUNTER — Other Ambulatory Visit: Payer: Self-pay | Admitting: Family Medicine

## 2016-09-28 NOTE — Telephone Encounter (Signed)
Last Vit D 03/09/16  23.0

## 2016-11-06 ENCOUNTER — Other Ambulatory Visit: Payer: Self-pay | Admitting: *Deleted

## 2016-11-06 MED ORDER — LORAZEPAM 1 MG PO TABS: 1.0000 mg | ORAL_TABLET | Freq: Every day | ORAL | 0 refills | Status: DC

## 2016-11-06 NOTE — Telephone Encounter (Signed)
Phoned in.

## 2016-11-19 ENCOUNTER — Other Ambulatory Visit: Payer: Self-pay | Admitting: Family Medicine

## 2016-12-20 ENCOUNTER — Encounter: Payer: Self-pay | Admitting: Internal Medicine

## 2017-01-30 ENCOUNTER — Other Ambulatory Visit: Payer: Self-pay | Admitting: Family Medicine

## 2017-01-30 ENCOUNTER — Other Ambulatory Visit: Payer: Self-pay | Admitting: Physician Assistant

## 2017-02-05 ENCOUNTER — Other Ambulatory Visit: Payer: Self-pay | Admitting: Family Medicine

## 2017-02-27 ENCOUNTER — Other Ambulatory Visit: Payer: Self-pay | Admitting: Family Medicine

## 2017-04-02 ENCOUNTER — Other Ambulatory Visit: Payer: Self-pay | Admitting: Family Medicine

## 2017-04-02 NOTE — Telephone Encounter (Signed)
lmtcb

## 2017-04-06 NOTE — Telephone Encounter (Signed)
[facility] her to xarelto on 05/18/2016. I found note in epic.

## 2017-04-07 ENCOUNTER — Other Ambulatory Visit: Payer: Self-pay | Admitting: Family Medicine

## 2017-04-16 ENCOUNTER — Other Ambulatory Visit: Payer: Self-pay | Admitting: Family Medicine

## 2017-04-29 ENCOUNTER — Other Ambulatory Visit: Payer: Self-pay | Admitting: Family Medicine

## 2017-05-01 NOTE — Telephone Encounter (Signed)
Patient aware and states she has moved to [place] so she will be looking for a new provider.

## 2017-05-03 ENCOUNTER — Other Ambulatory Visit: Payer: Self-pay | Admitting: Family Medicine

## 2017-05-03 NOTE — Telephone Encounter (Signed)
Last thyroid 06/30/15

## 2017-06-01 ENCOUNTER — Other Ambulatory Visit: Payer: Self-pay | Admitting: Family Medicine

## 2017-06-01 NOTE — Telephone Encounter (Signed)
Last thyroid 6/17

## 2017-06-25 ENCOUNTER — Other Ambulatory Visit: Payer: Self-pay | Admitting: Family Medicine

## 2017-07-30 ENCOUNTER — Other Ambulatory Visit: Payer: Self-pay | Admitting: Family Medicine

## 2017-07-30 NOTE — Telephone Encounter (Signed)
Last thyroid 06/30/15

## 2017-08-27 ENCOUNTER — Other Ambulatory Visit: Payer: Self-pay

## 2017-08-27 ENCOUNTER — Other Ambulatory Visit: Payer: Self-pay | Admitting: Family Medicine

## 2017-08-27 MED ORDER — LEVOTHYROXINE SODIUM 50 MCG PO TABS: 50.0000 ug | ORAL_TABLET | Freq: Every day | ORAL | 0 refills | Status: DC

## 2017-08-27 NOTE — Telephone Encounter (Signed)
Last thyroid 06/30/15  Dr Bradshaw 

## 2017-09-25 ENCOUNTER — Other Ambulatory Visit: Payer: Self-pay | Admitting: Family Medicine

## 2017-09-25 NOTE — Telephone Encounter (Signed)
Last thyroid 06/30/15  Dr Bradshaw 

## 2017-09-25 NOTE — Telephone Encounter (Signed)
14 tablets sent to pharmacy. lmtcb to schedule an appt

## 2017-09-25 NOTE — Telephone Encounter (Signed)
May send 14 tabs, call patient, last fill, must be seen

## 2017-10-07 ENCOUNTER — Other Ambulatory Visit: Payer: Self-pay | Admitting: Physician Assistant

## 2017-10-08 NOTE — Telephone Encounter (Signed)
Last thyroid 06/30/15  Dr Ermalinda MemosBradshaw

## 2017-11-04 ENCOUNTER — Other Ambulatory Visit: Payer: Self-pay | Admitting: Family Medicine

## 2017-11-06 IMAGING — DX DG HAND COMPLETE 3+V*R*
3 series · 3 of 3 positions shown · non-contrast
Comparison: None in PACs

CLINICAL DATA: Status post fall, right hand pain.

EXAM:
RIGHT HAND - COMPLETE 3+ VIEW

[hand pa]
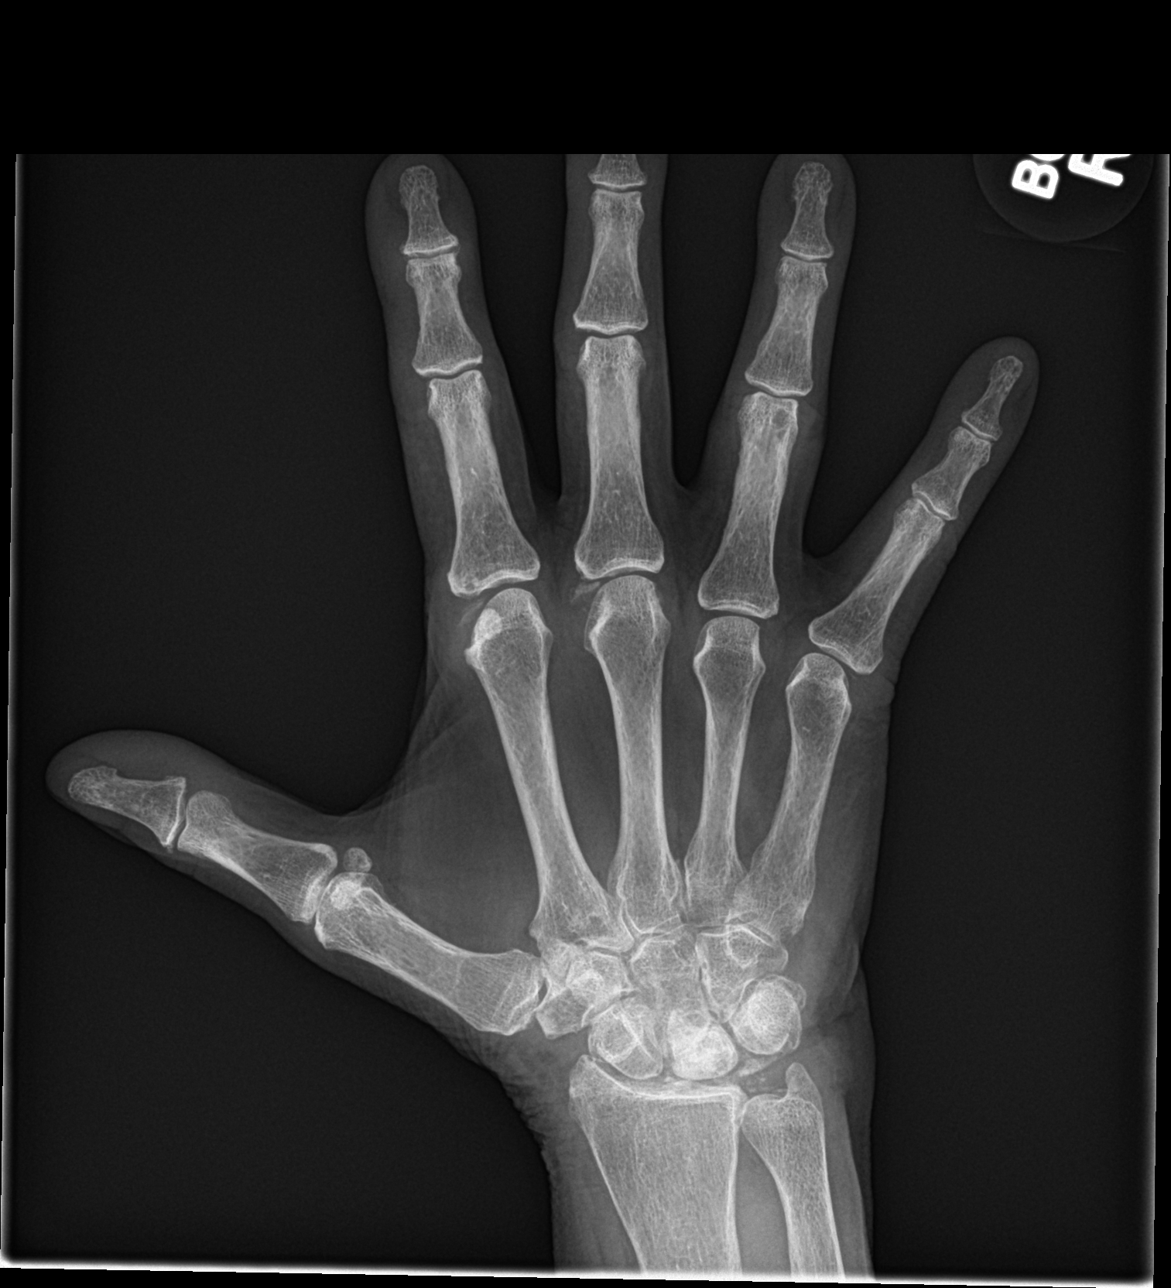

[hand obl]
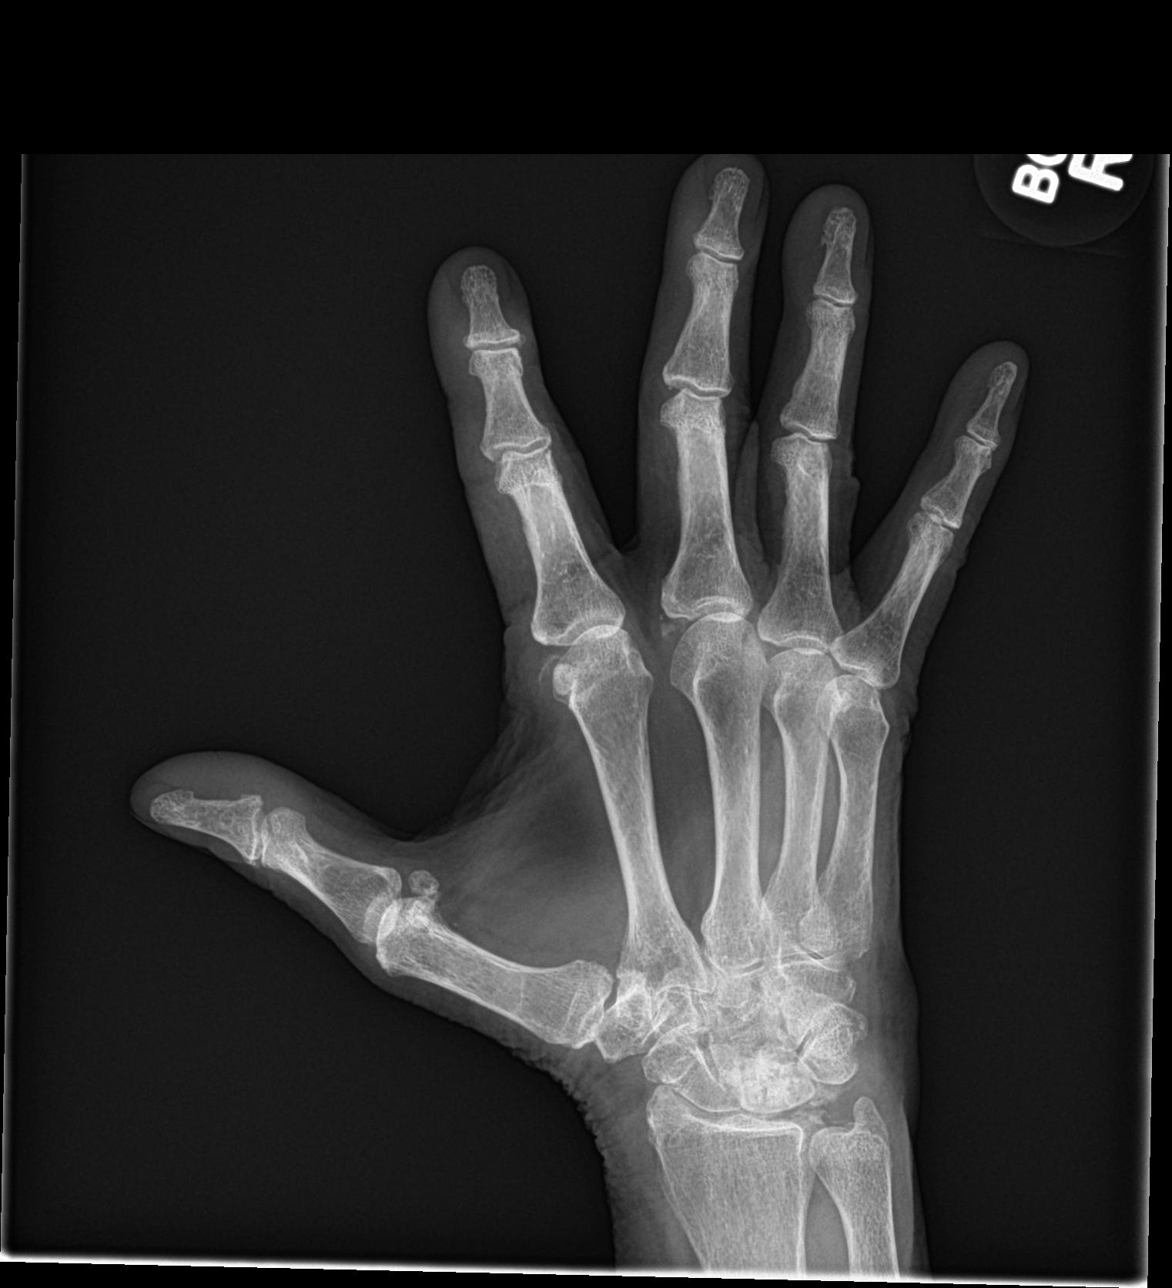

[hand lat]
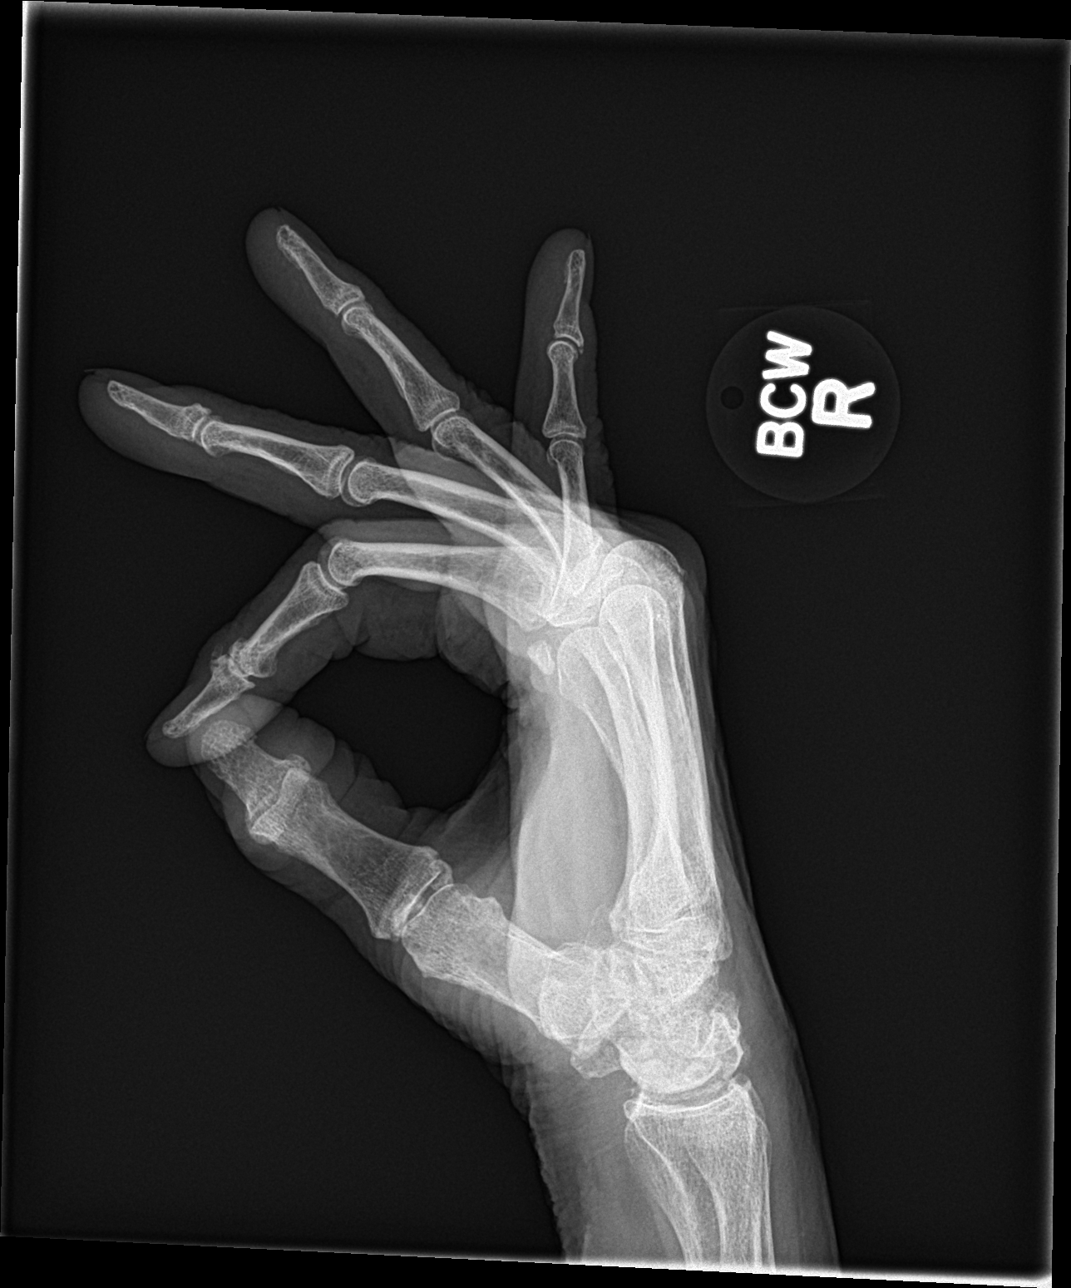

[3 of 3 positions shown; findings below may reference images not displayed]

FINDINGS: The bones are subjectively adequately mineralized. There is no acute
fracture or dislocation. The joint spaces are reasonably well
maintained for age. There is calcification associated with the
articular cartilage or joint capsules of the second and third MCP
joints. The carpal bones and observed portions of the distal radius
and ulna appear normal. There is calcification of the triangular
fibrocartilage.
IMPRESSION: There is no acute fracture or dislocation of the bones of the right
hand.

## 2017-11-06 IMAGING — DX DG ELBOW 2V*R*
2 series · 2 of 2 positions shown · non-contrast
Comparison: None.

EXAM:
RIGHT ELBOW - 2 VIEW

[elbow ap]
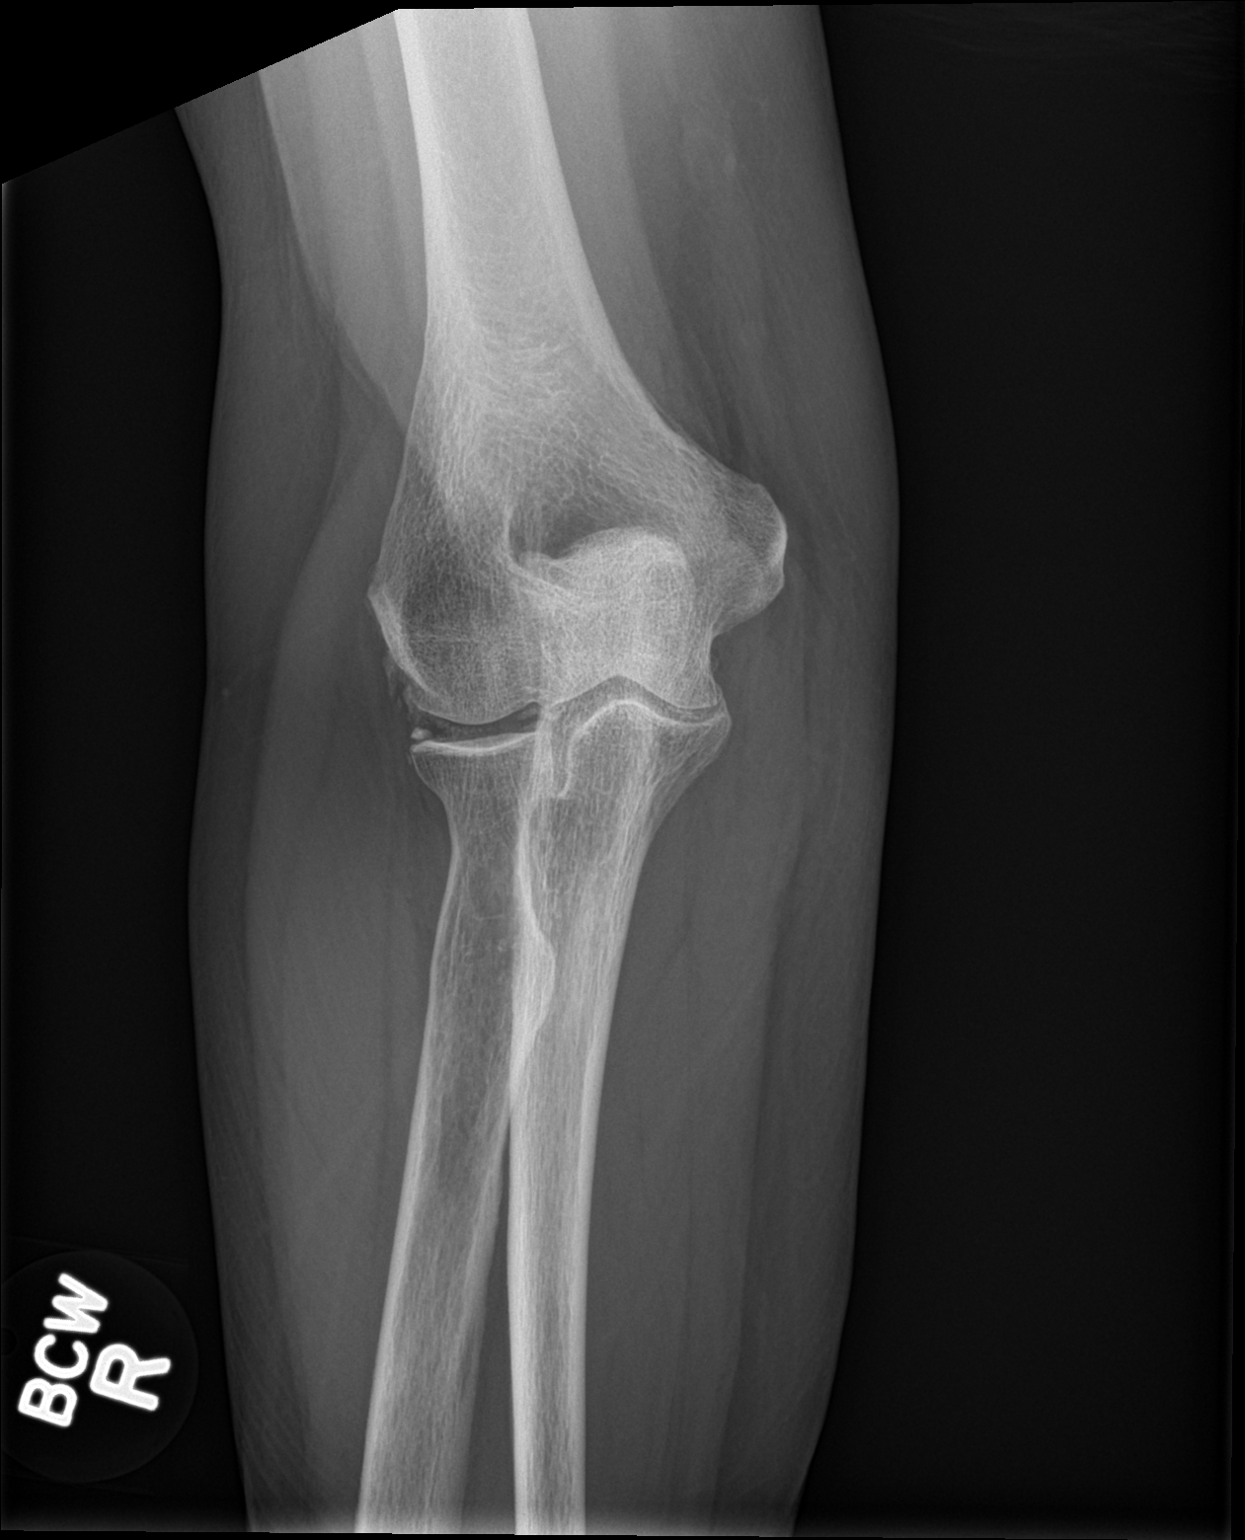

[elbow lat]
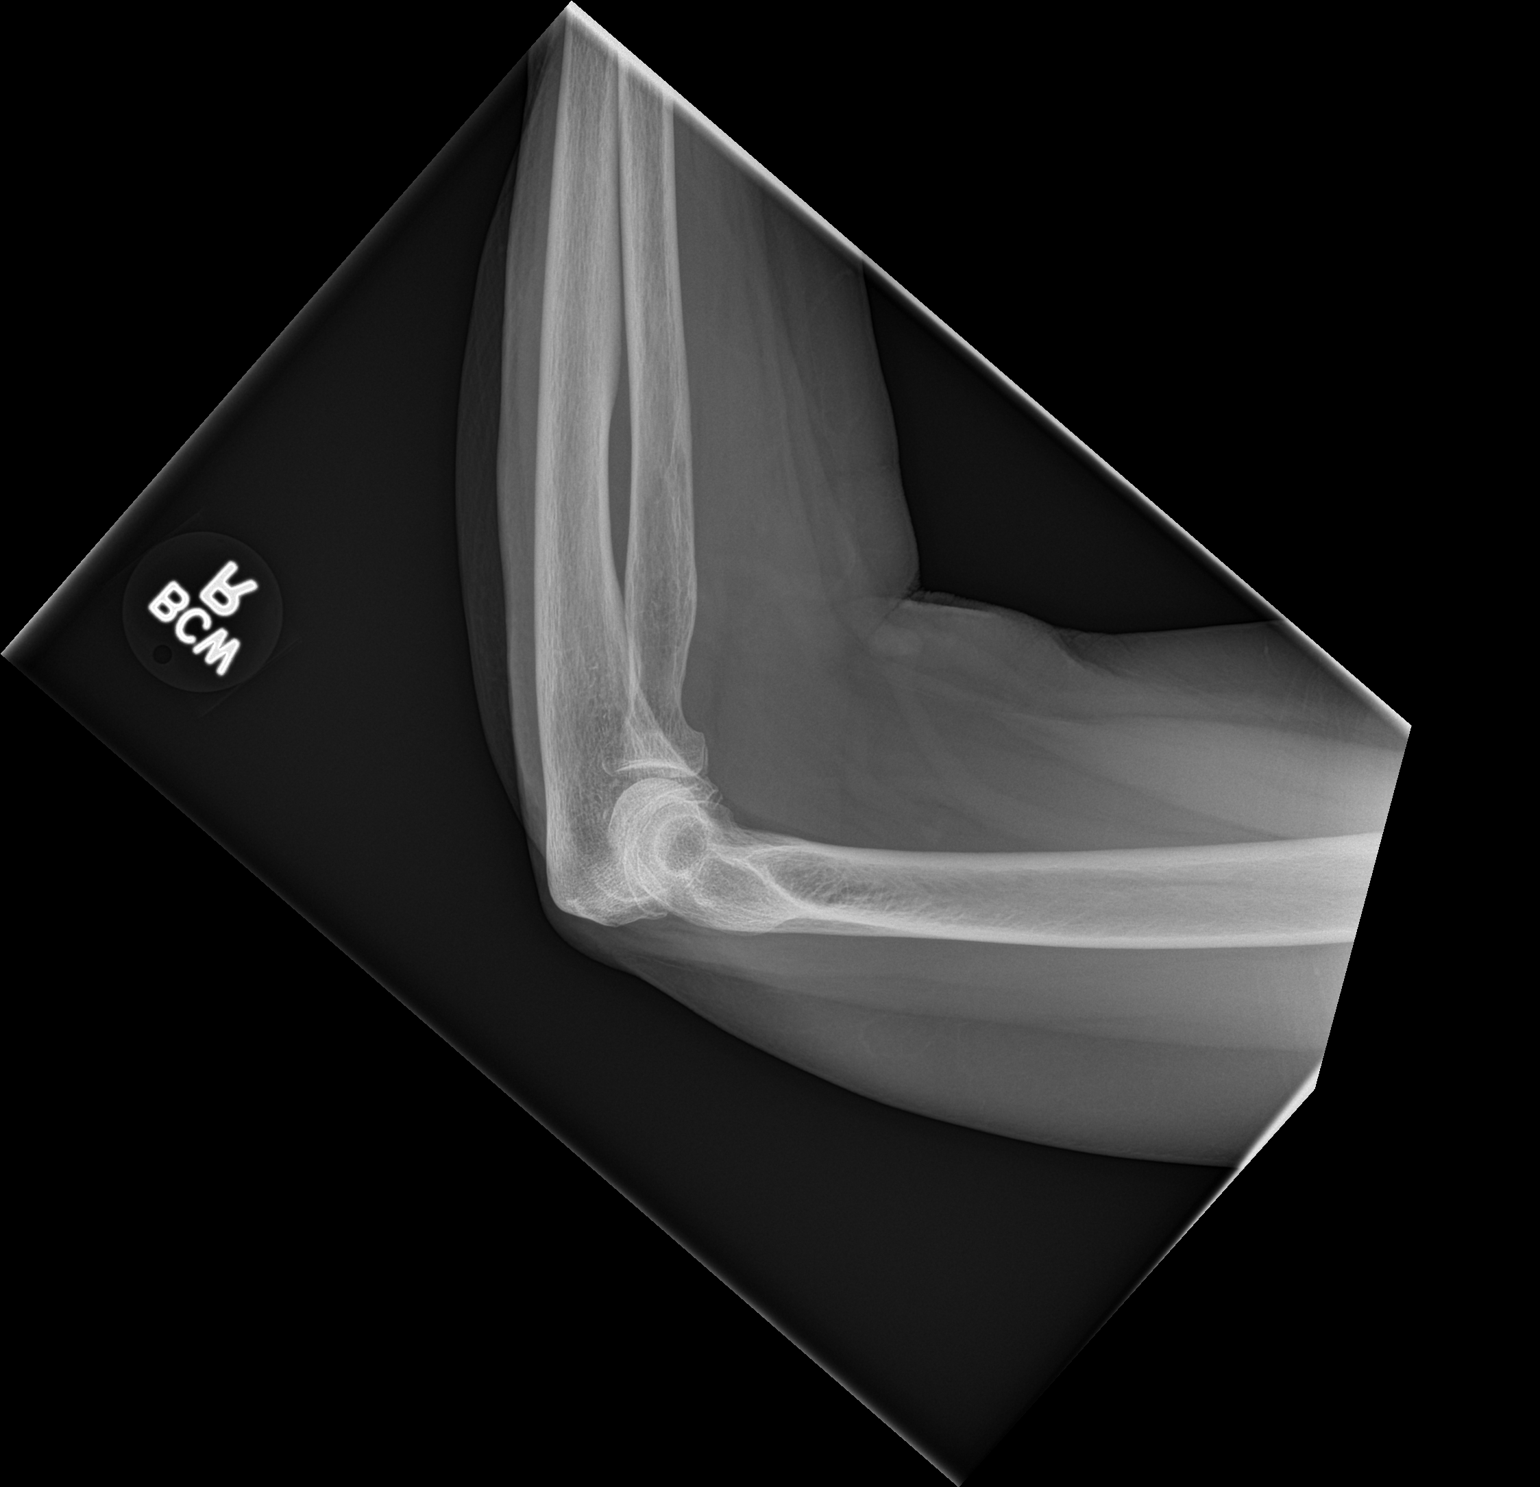

[2 of 2 positions shown; findings below may reference images not displayed]

FINDINGS: The bones are subjectively adequately mineralized. There is mild
calcification of the articular cartilage of the lateral aspect of
the joint. The radial head is intact as is the olecranon. The
supracondylar region is normal. There is no joint effusion.
IMPRESSION: Mild degenerative change of the elbow with chondrocalcinosis of the
articular cartilage. No acute fracture or dislocation is observed on
this two-view series. If there are strong clinical concerns of an
occult fracture or significant soft tissue injury, MRI would be a
useful next imaging step.

## 2017-11-08 DIAGNOSIS — Z961 Presence of intraocular lens: Secondary | ICD-10-CM | POA: Insufficient documentation

## 2017-11-08 DIAGNOSIS — H26493 Other secondary cataract, bilateral: Secondary | ICD-10-CM | POA: Insufficient documentation

## 2017-11-22 DIAGNOSIS — Z1589 Genetic susceptibility to other disease: Secondary | ICD-10-CM | POA: Insufficient documentation

## 2017-12-03 DIAGNOSIS — M5416 Radiculopathy, lumbar region: Secondary | ICD-10-CM | POA: Insufficient documentation

## 2017-12-03 DIAGNOSIS — G8929 Other chronic pain: Secondary | ICD-10-CM | POA: Insufficient documentation

## 2019-08-15 DIAGNOSIS — M1611 Unilateral primary osteoarthritis, right hip: Secondary | ICD-10-CM | POA: Insufficient documentation

## 2020-06-02 DIAGNOSIS — Z96641 Presence of right artificial hip joint: Secondary | ICD-10-CM | POA: Insufficient documentation

## 2020-08-31 DIAGNOSIS — E039 Hypothyroidism, unspecified: Secondary | ICD-10-CM | POA: Insufficient documentation

## 2020-08-31 DIAGNOSIS — L102 Pemphigus foliaceous: Secondary | ICD-10-CM | POA: Insufficient documentation

## 2020-10-05 DIAGNOSIS — R32 Unspecified urinary incontinence: Secondary | ICD-10-CM | POA: Insufficient documentation

## 2020-12-19 DIAGNOSIS — M48062 Spinal stenosis, lumbar region with neurogenic claudication: Secondary | ICD-10-CM | POA: Insufficient documentation

## 2021-09-14 DIAGNOSIS — F411 Generalized anxiety disorder: Secondary | ICD-10-CM | POA: Insufficient documentation

## 2021-09-20 DIAGNOSIS — F5104 Psychophysiologic insomnia: Secondary | ICD-10-CM | POA: Insufficient documentation

## 2021-11-22 DIAGNOSIS — K5730 Diverticulosis of large intestine without perforation or abscess without bleeding: Secondary | ICD-10-CM | POA: Insufficient documentation

## 2022-02-23 ENCOUNTER — Ambulatory Visit: Payer: Medicare Other | Admitting: Orthopaedic Surgery

## 2022-03-16 ENCOUNTER — Ambulatory Visit: Payer: Medicare PPO | Admitting: Orthopaedic Surgery

## 2022-03-16 ENCOUNTER — Encounter: Payer: Self-pay | Admitting: Orthopaedic Surgery

## 2022-03-16 ENCOUNTER — Ambulatory Visit (INDEPENDENT_AMBULATORY_CARE_PROVIDER_SITE_OTHER): Payer: Medicare PPO

## 2022-03-16 VITALS — Ht 63.0 in | Wt 150.0 lb

## 2022-03-16 DIAGNOSIS — G8929 Other chronic pain: Secondary | ICD-10-CM

## 2022-03-16 DIAGNOSIS — M545 Low back pain, unspecified: Secondary | ICD-10-CM

## 2022-03-16 NOTE — Progress Notes (Signed)
Office Visit Note   Patient: Sherry Levine           Date of Birth: [birth year]           MRN: [id number] Visit Date: 03/16/2022              Requested by: No referring provider defined for this encounter. PCP: Timmothy Euler, MD (Inactive)   Assessment & Plan: Visit Diagnoses:  1. Chronic bilateral low back pain, unspecified whether sciatica present     Plan: Patient to be in a walking program stretching program.  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  Recheck 5 weeks.  We can obtain 2 view x-rays cervical spine on return.  Follow-Up Instructions: No follow-ups on file.   Orders:  Orders Placed This Encounter  Procedures   XR Lumbar Spine Complete   No orders of the defined types were placed in this encounter.     Procedures: No procedures performed   Clinical Data: No additional findings.   Subjective: Chief Complaint  Patient presents with   Lower Back - Pain    HPI 77 year old female with low back pain chronic neck pain.  She has seen multiple surgeons.  Some is recommended therapy some recommended surgery.  She has had hydrocodone which does not give her relief.  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  She has pain across her back down to her tailbone radiates into her right leg and sometimes into her foot with some numbness and tingling.  Currently taking Tylenol.  Previous surgery with Dr. Joya Salm C-spine.  Lumbar MRI 2008 showed some disc degeneration L3-4 moderate facet changes mild facet hypertrophy at L4-5 and L5-S1.  Review of Systems   Objective: Vital Signs: Ht '5\' 3"'$  (1.6 m)   Wt 150 lb (68 kg)   LMP 04/05/1982   BMI 26.57 kg/m   Physical Exam  Ortho Exam  Specialty Comments:  No specialty comments available.  Imaging: No results found.   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   Insomnia 11/16/2010   Primary hypercoagulable state ([place]) 11/11/2010   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   GERD 03/21/2007   PEPTIC STRICTURE 03/21/2007   HIATAL HERNIA 03/21/2007   IBS 03/21/2007   ARTHRITIS 03/21/2007   Osteoarthritis of spine 03/21/2007   DISC DISEASE, CERVICAL 03/21/2007   Fibromyalgia 03/21/2007   Past Medical History:  Diagnosis Date   Allergy    Anxiety    Clotting disorder ([place])    Diverticulitis    DVT (deep venous thrombosis) ([facility])    Fibromyalgia    Gout    Osteoarthritis    Osteoarthritis (arthritis due to wear and tear of joints)    Plantar fasciitis    Pulmonary embolism ([facility])    Thyroid disease     Family History  Problem Relation Age of Onset   Stroke Mother  Heart disease Mother    Stroke Father    Heart disease Father     Past Surgical History:  Procedure Laterality Date   ABDOMINAL HYSTERECTOMY     carpel tunnel     right hand   CERVICAL FUSION     CHOLECYSTECTOMY  2015   EYE SURGERY Bilateral    cataracts   KNEE ARTHROSCOPY WITH LATERAL MENISECTOMY     right knee   Social History   Occupational History   Not on file  Tobacco Use   Smoking status: Never   Smokeless tobacco: Never  Substance and Sexual Activity   Alcohol use: No   Drug use: No   Sexual activity: Not on file

## 2022-04-20 ENCOUNTER — Ambulatory Visit (INDEPENDENT_AMBULATORY_CARE_PROVIDER_SITE_OTHER): Payer: Medicare PPO | Admitting: Orthopaedic Surgery

## 2022-04-20 ENCOUNTER — Other Ambulatory Visit (INDEPENDENT_AMBULATORY_CARE_PROVIDER_SITE_OTHER): Payer: Medicare PPO

## 2022-04-20 ENCOUNTER — Encounter: Payer: Self-pay | Admitting: Orthopaedic Surgery

## 2022-04-20 VITALS — Ht 63.0 in | Wt 150.0 lb

## 2022-04-20 DIAGNOSIS — M7542 Impingement syndrome of left shoulder: Secondary | ICD-10-CM

## 2022-04-20 DIAGNOSIS — M542 Cervicalgia: Secondary | ICD-10-CM

## 2022-04-20 NOTE — Progress Notes (Signed)
Office Visit Note   Patient: Sherry SaxBetty C Levine           Date of Birth: 01/03/1946           MRN: [id number] Visit Date: 04/20/2022              Requested by: No referring provider defined for this encounter. PCP: Elenora GammaBradshaw, Samuel L, MD (Inactive)   Assessment & Plan: Visit Diagnoses:  1. Neck pain   2. Impingement syndrome of left shoulder     Plan: Left subacromial injection performed with improvement in shoulder function.  Some this may be related to her cervical spine but appears to be more shoulder than the neck.  Recheck 4 weeks.  Follow-Up Instructions: Return in about 4 weeks (around 05/18/2022).   Orders:  Orders Placed This Encounter  Procedures   Large Joint Inj: L subacromial bursa   XR Cervical Spine 2 or 3 views   No orders of the defined types were placed in this encounter.     Procedures: Large Joint Inj: L subacromial bursa on 04/20/2022 2:36 PM Indications: pain Details: 22 G 1.5 in needle  Arthrogram: No  Medications: 4 mL bupivacaine 0.25 %; 40 mg methylPREDNISolone acetate 40 MG/ML; 0.5 mL lidocaine 1 % Outcome: tolerated well, no immediate complications Procedure, treatment alternatives, risks and benefits explained, specific risks discussed. Consent was given by the patient. Immediately prior to procedure a time out was called to verify the correct patient, procedure, equipment, support staff and site/side marked as required. Patient was prepped and draped in the usual sterile fashion.       Clinical Data: No additional findings.   Subjective: Chief Complaint  Patient presents with   Lower Back - Pain    HPI 77 year old female returns for follow-up of low back pain.  She does have a history of DVT and PE.  She has had neck pain problems and recently had increased pain in her left shoulder with difficulty reaching overhead activity.  Pain radiates in the supraspinatus slightly to the left side of her neck but is not aggravated with cervical  range of motion.  Review of Systems history of DVT with PE.  Back pain neck pain shoulder pain.  History of GERD sleep apnea.  All systems noncontributory to HPI.   Objective: Vital Signs: Ht 5\' 3"  (1.6 m)   Wt 150 lb (68 kg)   LMP 04/05/1982   BMI 26.57 kg/m   Physical Exam Constitutional:      Appearance: She is well-developed.  HENT:     Head: Normocephalic.     Right Ear: External ear normal.     Left Ear: External ear normal. There is no impacted cerumen.  Eyes:     Pupils: Pupils are equal, round, and reactive to light.  Neck:     Thyroid: No thyromegaly.     Trachea: No tracheal deviation.  Cardiovascular:     Rate and Rhythm: Normal rate.  Pulmonary:     Effort: Pulmonary effort is normal.  Abdominal:     Palpations: Abdomen is soft.  Musculoskeletal:     Cervical back: No rigidity.  Skin:    General: Skin is warm and dry.  Neurological:     Mental Status: She is alert and oriented to person, place, and time.  Psychiatric:        Behavior: Behavior normal.     Ortho Exam some decreased cervical range of motion full without pain negative Spurling minimal brachial plexus  tenderness well-healed anterior scar.  Positive impingement left shoulder negative drop arm test subscap teres and infraspinatus are normal to resisted testing.  Good passive range of motion of the shoulder.  No lower extremity clonus.  Some tenderness with the lumbosacral palpation and sciatic notch tenderness negative straight leg raising 90 degrees.  No lower extremity clonus.  Specialty Comments:  No specialty comments available.  Imaging: XR Cervical Spine 2 or 3 views  Result Date: 04/21/2022 AP lateral C-spine x-rays are obtained and reviewed.  This shows previous fusion C5-C7 with allograft and plate with 5 screws.  No significant adjacent level changes normal cervical lordosis. Impression: Previous cervical fusion, C5-C7.    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   Insomnia 11/16/2010   Primary hypercoagulable state 11/11/2010   Aneurysm of renal artery 11/11/2010   GERD 03/21/2007   PEPTIC STRICTURE 03/21/2007   HIATAL HERNIA 03/21/2007   IBS 03/21/2007   ARTHRITIS 03/21/2007   Osteoarthritis of spine 03/21/2007   DISC DISEASE, CERVICAL 03/21/2007   Fibromyalgia 03/21/2007   Past Medical History:  Diagnosis Date   Allergy    Anxiety    Clotting disorder    Diverticulitis    DVT (deep venous thrombosis)    Fibromyalgia    Gout    Osteoarthritis    Osteoarthritis (arthritis due to wear and tear of joints)    Plantar fasciitis    Pulmonary embolism    Thyroid disease     Family History  Problem Relation Age of Onset   Stroke Mother    Heart disease Mother    Stroke Father    Heart disease Father     Past Surgical History:  Procedure Laterality Date   ABDOMINAL HYSTERECTOMY     carpel tunnel     right hand   CERVICAL FUSION     CHOLECYSTECTOMY  2015   EYE SURGERY Bilateral    cataracts   KNEE ARTHROSCOPY WITH LATERAL MENISECTOMY     right knee   Social History   Occupational History   Not on file  Tobacco Use   Smoking status: Never   Smokeless tobacco: Never  Substance and Sexual Activity   Alcohol use: No   Drug use: No   Sexual activity: Not on file

## 2022-04-21 MED ORDER — LIDOCAINE HCL 1 % IJ SOLN
0.5000 mL | INTRAMUSCULAR | Status: AC | PRN
  Administered 2022-04-20: .5 mL

## 2022-04-21 MED ORDER — BUPIVACAINE HCL 0.25 % IJ SOLN
4.0000 mL | INTRAMUSCULAR | Status: AC | PRN
  Administered 2022-04-20: 4 mL via INTRA_ARTICULAR

## 2022-04-21 MED ORDER — METHYLPREDNISOLONE ACETATE 40 MG/ML IJ SUSP
40.0000 mg | INTRAMUSCULAR | Status: AC | PRN
  Administered 2022-04-20: 40 mg via INTRA_ARTICULAR

## 2022-05-08 ENCOUNTER — Telehealth: Payer: Self-pay | Admitting: Gastroenterology

## 2022-05-08 DIAGNOSIS — F331 Major depressive disorder, recurrent, moderate: Secondary | ICD-10-CM | POA: Insufficient documentation

## 2022-05-08 NOTE — Telephone Encounter (Signed)
HI Dr. Tomasa Rand,  Supervising Provider: 05/08/22- AM  Patient called requesting a transfer of care over to [facility] states she now resides in this area and needs to establish Gi care. She has GI history and her records are already available in Epic for you to review and advise on scheduling.   Thank you

## 2022-05-09 NOTE — Telephone Encounter (Signed)
Good morning Dr. Tomasa Rand,   This patient called back, stating that she would like her Transfer of Care request to go to Dr. Rhea Belton. She explained that her daughter is currently his patient and would also want to establish with him.    Dr. Rhea Belton, would you please review and advise on scheduling?   Thank you.

## 2022-05-09 NOTE — Telephone Encounter (Signed)
ok 

## 2022-05-10 NOTE — Telephone Encounter (Signed)
Left voicemail for patient to call back and schedule office visit with Dr. Rhea Belton.

## 2022-07-28 ENCOUNTER — Other Ambulatory Visit: Payer: Self-pay

## 2022-08-10 ENCOUNTER — Emergency Department (HOSPITAL_COMMUNITY)
Admission: EM | Admit: 2022-08-10 | Discharge: 2022-08-10 | Disposition: A | Discharge status: Home / Self Care | Payer: Medicare PPO | Attending: Emergency Medicine | Admitting: Emergency Medicine

## 2022-08-10 ENCOUNTER — Encounter (HOSPITAL_COMMUNITY): Payer: Self-pay | Admitting: Emergency Medicine

## 2022-08-10 ENCOUNTER — Other Ambulatory Visit: Payer: Self-pay

## 2022-08-10 ENCOUNTER — Emergency Department (HOSPITAL_COMMUNITY): Payer: Medicare PPO

## 2022-08-10 DIAGNOSIS — M79604 Pain in right leg: Secondary | ICD-10-CM | POA: Diagnosis present

## 2022-08-10 DIAGNOSIS — M79605 Pain in left leg: Secondary | ICD-10-CM | POA: Diagnosis not present

## 2022-08-10 DIAGNOSIS — Z7901 Long term (current) use of anticoagulants: Secondary | ICD-10-CM | POA: Insufficient documentation

## 2022-08-10 LAB — BASIC METABOLIC PANEL
Anion gap: 7 (ref 5–15)
BUN: 14 mg/dL (ref 8–23)
CO2: 25 mmol/L (ref 22–32)
Calcium: 9.1 mg/dL (ref 8.9–10.3)
Chloride: 104 mmol/L (ref 98–111)
Creatinine, Ser: 0.97 mg/dL (ref 0.44–1.00)
GFR, Estimated: >60 mL/min (ref >60)
Glucose, Bld: 100 mg/dL — ABNORMAL HIGH (ref 70–99)
Potassium: 4.3 mmol/L (ref 3.5–5.1)
Sodium: 136 mmol/L (ref 135–145)

## 2022-08-10 LAB — CBC WITH DIFFERENTIAL/PLATELET
Abs Immature Granulocytes: 0.01 10*3/uL (ref 0.00–0.07)
Basophils Absolute: 0 10*3/uL (ref 0.0–0.1)
Basophils Relative: 1 %
Eosinophils Absolute: 0.2 10*3/uL (ref 0.0–0.5)
Eosinophils Relative: 3 %
HCT: 35.1 % — ABNORMAL LOW (ref 36.0–46.0)
Hemoglobin: 10.8 g/dL — ABNORMAL LOW (ref 12.0–15.0)
Immature Granulocytes: 0 %
Lymphocytes Relative: 30 %
Lymphs Abs: 1.6 10*3/uL (ref 0.7–4.0)
MCH: 32.8 pg (ref 26.0–34.0)
MCHC: 30.8 g/dL (ref 30.0–36.0)
MCV: 106.7 fL — ABNORMAL HIGH (ref 80.0–100.0)
Monocytes Absolute: 0.6 10*3/uL (ref 0.1–1.0)
Monocytes Relative: 11 %
Neutro Abs: 2.9 10*3/uL (ref 1.7–7.7)
Neutrophils Relative %: 55 %
Platelets: 182 10*3/uL (ref 150–400)
RBC: 3.29 MIL/uL — ABNORMAL LOW (ref 3.87–5.11)
RDW: 12.6 % (ref 11.5–15.5)
WBC: 5.3 10*3/uL (ref 4.0–10.5)
nRBC: 0 % (ref 0.0–0.2)

## 2022-08-10 NOTE — ED Provider Notes (Signed)
[place] EMERGENCY DEPARTMENT AT [facility] Provider Note   CSN: [id number] Arrival date & time: 08/10/22  1249     History Chief Complaint  Patient presents with   Leg Pain    Sherry Levine is a 77 y.o. female.  Patient with past history significant for DVT, PE, prothrombin mutation presents emergency department concerns of leg pain.  States has been ongoing for few days and has noted some mild swelling in bilateral legs.  Is currently on Xarelto on denies any missed doses.  Denies any chest pain or shortness of breath.  No syncope no recent signs of infection such as fever or general malaise. No recent injury or trauma to the lower legs. No recent surgery, prolonged immobilization, or missed doses of blood thinner.   Leg Pain      Home Medications Prior to Admission medications   Medication Sig Start Date End Date Taking? Authorizing Provider  acetaminophen (TYLENOL) 500 MG tablet Take 1,000 mg by mouth every 6 (six) hours.    [provider]  cetirizine (ZYRTEC) 10 MG tablet TAKE 1 TABLET BY MOUTH ONCE DAILY 04/16/17   Elenora Gamma, MD  DULoxetine (CYMBALTA) 60 MG capsule TAKE 1 CAPSULE BY MOUTH ONCE DAILY 01/30/17   Elenora Gamma, MD  gabapentin (NEURONTIN) 300 MG capsule Take 1 capsule by mouth 3 (three) times daily. 01/22/18   [provider]  levothyroxine (SYNTHROID, LEVOTHROID) 50 MCG tablet TAKE 1 TABLET(50 MCG) BY MOUTH DAILY 10/08/17   Mechele Claude, MD  LORazepam (ATIVAN) 1 MG tablet TAKE 1 TABLET BY MOUTH AT BEDTIME IF NEEDED 01/30/17   Elenora Gamma, MD  methocarbamol (ROBAXIN) 500 MG tablet TAKE 1 TABLET BY MOUTH FOUR TIMES DAILY FOR 10 DAYS    [provider]  pantoprazole (PROTONIX) 40 MG tablet TAKE 1 TABLET BY MOUTH ONCE DAILY 01/30/17   Elenora Gamma, MD  polyethylene glycol (MIRALAX / GLYCOLAX) packet Take 17 g by mouth daily. 07/05/15   Meredeth Ide, MD  rosuvastatin (CRESTOR) 5 MG tablet TAKE 1 TABLET BY  MOUTH EVERY OTHER DAY 05/30/16   Elenora Gamma, MD  Vitamin D, Ergocalciferol, (DRISDOL) 50000 units CAPS capsule TAKE 1 CAPSULE BY MOUTH ONCE WEEKLY 09/28/16   Elenora Gamma, MD  XARELTO 10 MG TABS tablet TAKE 1 TABLET BY MOUTH ONCE DAILY 05/01/17   Elenora Gamma, MD      Allergies    Celebrex [celecoxib], Colchicine, Lyrica [pregabalin], Omnicef [cefdinir], Penicillins, Zithromax [azithromycin], and Codeine    Review of Systems   Review of Systems  Musculoskeletal:        Leg pain  All other systems reviewed and are negative.   Physical Exam Updated Vital Signs BP 119/70   Pulse 68   Temp 97.8 F (36.6 C) (Oral)   Resp 18   Ht 5\' 3"  (1.6 m)   Wt 68 kg   LMP 04/05/1982   SpO2 100%   BMI 26.57 kg/m  Physical Exam Vitals and nursing note reviewed.  Constitutional:      General: She is not in acute distress.    Appearance: She is well-developed.  HENT:     Head: Normocephalic and atraumatic.  Eyes:     Conjunctiva/sclera: Conjunctivae normal.  Cardiovascular:     Rate and Rhythm: Normal rate and regular rhythm.     Pulses:          Dorsalis pedis pulses are 2+ on the right side and  2+ on the left side.       Posterior tibial pulses are 2+ on the right side and 2+ on the left side.     Heart sounds: No murmur heard. Pulmonary:     Effort: Pulmonary effort is normal. No respiratory distress.     Breath sounds: Normal breath sounds.  Abdominal:     Palpations: Abdomen is soft.     Tenderness: There is no abdominal tenderness.  Musculoskeletal:        General: Tenderness present. No swelling.     Cervical back: Neck supple.     Right lower leg: No edema.     Left lower leg: No edema.     Comments: Tenderness to palpation in bilateral lower legs. No obvious masses.  Skin:    General: Skin is warm and dry.     Capillary Refill: Capillary refill takes less than 2 seconds.  Neurological:     Mental Status: She is alert.  Psychiatric:        Mood and  Affect: Mood normal.     ED Results / Procedures / Treatments   Labs (all labs ordered are listed, but only abnormal results are displayed) Labs Reviewed  CBC WITH DIFFERENTIAL/PLATELET - Abnormal; Notable for the following components:      Result Value   RBC 3.29 (*)    Hemoglobin 10.8 (*)    HCT 35.1 (*)    MCV 106.7 (*)    All other components within normal limits  BASIC METABOLIC PANEL - Abnormal; Notable for the following components:   Glucose, Bld 100 (*)    All other components within normal limits    EKG None  Radiology US Venous Img Lower Bilateral  Result Date: 08/10/2022 CLINICAL DATA:  Bilateral lower extremity pain and edema. History of previous DVT. Evaluate for acute or chronic DVT. EXAM: BILATERAL LOWER EXTREMITY VENOUS DOPPLER ULTRASOUND TECHNIQUE: Gray-scale sonography with graded compression, as well as color Doppler and duplex ultrasound were performed to evaluate the lower extremity deep venous systems from the level of the common femoral vein and including the common femoral, femoral, profunda femoral, popliteal and calf veins including the posterior tibial, peroneal and gastrocnemius veins when visible. The superficial great saphenous vein was also interrogated. Spectral Doppler was utilized to evaluate flow at rest and with distal augmentation maneuvers in the common femoral, femoral and popliteal veins. COMPARISON:  None Available. FINDINGS: RIGHT LOWER EXTREMITY Common Femoral Vein: No evidence of thrombus. Normal compressibility, respiratory phasicity and response to augmentation. Saphenofemoral Junction: No evidence of thrombus. Normal compressibility and flow on color Doppler imaging. Profunda Femoral Vein: No evidence of thrombus. Normal compressibility and flow on color Doppler imaging. Femoral Vein: No evidence of thrombus. Normal compressibility, respiratory phasicity and response to augmentation. Popliteal Vein: No evidence of thrombus. Normal  compressibility, respiratory phasicity and response to augmentation. Calf Veins: No evidence of thrombus. Normal compressibility and flow on color Doppler imaging. Superficial Great Saphenous Vein: No evidence of thrombus. Normal compressibility. Other Findings:  None. LEFT LOWER EXTREMITY Common Femoral Vein: No evidence of thrombus. Normal compressibility, respiratory phasicity and response to augmentation. Saphenofemoral Junction: No evidence of thrombus. Normal compressibility and flow on color Doppler imaging. Profunda Femoral Vein: No evidence of thrombus. Normal compressibility and flow on color Doppler imaging. Femoral Vein: No evidence of thrombus. Normal compressibility, respiratory phasicity and response to augmentation. Popliteal Vein: No evidence of thrombus. Normal compressibility, respiratory phasicity and response to augmentation. Calf Veins: No evidence of thrombus.  Normal compressibility and flow on color Doppler imaging. Superficial Great Saphenous Vein: No evidence of thrombus. Normal compressibility. Other Findings:  None. IMPRESSION: No evidence of acute or chronic DVT within either lower extremity. Electronically Signed   By: Simonne Come M.D.   On: 08/10/2022 15:27    Procedures Procedures   Medications Ordered in ED Medications - No data to display  ED Course/ Medical Decision Making/ A&P                           Medical Decision Making Amount and/or Complexity of Data Reviewed Labs: ordered.   This patient presents to the ED for concern of leg pain.  Differential diagnosis includes DVT, peripheral artery disease, cellulitis, stress fracture, ankle sprain   Lab Tests:  I Ordered, and personally interpreted labs.  The pertinent results include: CBC with mild anemia with complaints open Red blood count, hemoglobin, hematocrit, BMP unremarkable   Imaging Studies ordered:  I ordered imaging studies including ultrasound of bilateral lower legs I independently visualized  and interpreted imaging which showed no evidence of any acute or chronic DVTs I agree with the radiologist interpretation   Problem List / ED Course:  Patient presents to the emergency department with complaints of leg pain.  Past history of DVT currently on Xarelto.  Currently denies any chest pain or shortness of breath.  Concerned about mild swelling and increasing pain to rule out DVT.  Physical examination is largely unremarkable with only mild swelling noted at the distal end but palpable pulses in the posterior tibialis.  Will evaluate with ultrasound imaging.  Basic labs ordered from triage as well. CBC and BMP are largely unremarkable patient does appear to have a chronic anemia.  Ultrasound pending. [place] negative for signs of DVT acute or chronic in bilateral lower legs. Informed patient of reassuring results. Advised patient that she should not change her dose of Xarelto. Also advised close outpatient follow up with PCP. Patient agreeable with treatment plan and verbalized understanding all return precautions. All questions answered prior to patient discharge.  Final Clinical Impression(s) / ED Diagnoses Final diagnoses:  Bilateral leg pain    Rx / DC Orders ED Discharge Orders     None         Smitty Knudsen, PA-C 08/10/22 1617    Margarita Grizzle, MD 08/12/22 [phone]

## 2022-08-10 NOTE — Discharge Instructions (Addendum)
You were seen in the ER for leg and ankle pain. Your labs were thankfully reassuring. Your ultrasound did not show evidence of any DVTs in the legs. I would advise following up with your primary care provider for further evaluation of your symptoms.  If you feel you have any acute worsening or decline in your symptoms, you may return to the emergency department.

## 2022-08-10 NOTE — ED Triage Notes (Signed)
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

## 2022-08-15 ENCOUNTER — Ambulatory Visit: Payer: Medicare PPO | Admitting: Internal Medicine

## 2022-09-22 ENCOUNTER — Encounter (HOSPITAL_COMMUNITY): Payer: Self-pay | Admitting: *Deleted

## 2022-09-22 ENCOUNTER — Inpatient Hospital Stay (HOSPITAL_COMMUNITY): Payer: Medicare PPO

## 2022-09-22 ENCOUNTER — Inpatient Hospital Stay (HOSPITAL_COMMUNITY)
Admission: EM | Admit: 2022-09-22 | Discharge: 2022-09-24 | DRG: 872 | Disposition: A | Discharge status: Home / Self Care | Payer: Medicare PPO | Attending: Family Medicine | Admitting: Family Medicine

## 2022-09-22 ENCOUNTER — Emergency Department (HOSPITAL_COMMUNITY): Payer: Medicare PPO

## 2022-09-22 ENCOUNTER — Other Ambulatory Visit: Payer: Self-pay

## 2022-09-22 DIAGNOSIS — Z823 Family history of stroke: Secondary | ICD-10-CM | POA: Diagnosis not present

## 2022-09-22 DIAGNOSIS — Z881 Allergy status to other antibiotic agents status: Secondary | ICD-10-CM | POA: Diagnosis not present

## 2022-09-22 DIAGNOSIS — Z8249 Family history of ischemic heart disease and other diseases of the circulatory system: Secondary | ICD-10-CM | POA: Diagnosis not present

## 2022-09-22 DIAGNOSIS — L100 Pemphigus vulgaris: Secondary | ICD-10-CM | POA: Diagnosis present

## 2022-09-22 DIAGNOSIS — R32 Unspecified urinary incontinence: Secondary | ICD-10-CM | POA: Diagnosis present

## 2022-09-22 DIAGNOSIS — Z880 Allergy status to penicillin: Secondary | ICD-10-CM

## 2022-09-22 DIAGNOSIS — Z86718 Personal history of other venous thrombosis and embolism: Secondary | ICD-10-CM | POA: Diagnosis not present

## 2022-09-22 DIAGNOSIS — Z888 Allergy status to other drugs, medicaments and biological substances status: Secondary | ICD-10-CM

## 2022-09-22 DIAGNOSIS — N390 Urinary tract infection, site not specified: Principal | ICD-10-CM | POA: Diagnosis present

## 2022-09-22 DIAGNOSIS — E039 Hypothyroidism, unspecified: Secondary | ICD-10-CM | POA: Diagnosis present

## 2022-09-22 DIAGNOSIS — F419 Anxiety disorder, unspecified: Secondary | ICD-10-CM | POA: Diagnosis present

## 2022-09-22 DIAGNOSIS — E871 Hypo-osmolality and hyponatremia: Secondary | ICD-10-CM | POA: Diagnosis present

## 2022-09-22 DIAGNOSIS — E872 Acidosis, unspecified: Secondary | ICD-10-CM | POA: Diagnosis present

## 2022-09-22 DIAGNOSIS — Z885 Allergy status to narcotic agent status: Secondary | ICD-10-CM

## 2022-09-22 DIAGNOSIS — Z86711 Personal history of pulmonary embolism: Secondary | ICD-10-CM

## 2022-09-22 DIAGNOSIS — E878 Other disorders of electrolyte and fluid balance, not elsewhere classified: Secondary | ICD-10-CM | POA: Diagnosis present

## 2022-09-22 DIAGNOSIS — E876 Hypokalemia: Secondary | ICD-10-CM | POA: Diagnosis present

## 2022-09-22 DIAGNOSIS — N3001 Acute cystitis with hematuria: Secondary | ICD-10-CM | POA: Diagnosis not present

## 2022-09-22 DIAGNOSIS — R1115 Cyclical vomiting syndrome unrelated to migraine: Secondary | ICD-10-CM | POA: Diagnosis present

## 2022-09-22 DIAGNOSIS — M1990 Unspecified osteoarthritis, unspecified site: Secondary | ICD-10-CM | POA: Diagnosis present

## 2022-09-22 DIAGNOSIS — Z1152 Encounter for screening for COVID-19: Secondary | ICD-10-CM | POA: Diagnosis not present

## 2022-09-22 DIAGNOSIS — Z90710 Acquired absence of both cervix and uterus: Secondary | ICD-10-CM | POA: Diagnosis not present

## 2022-09-22 DIAGNOSIS — Z7901 Long term (current) use of anticoagulants: Secondary | ICD-10-CM | POA: Diagnosis not present

## 2022-09-22 DIAGNOSIS — M797 Fibromyalgia: Secondary | ICD-10-CM | POA: Diagnosis present

## 2022-09-22 DIAGNOSIS — K5909 Other constipation: Secondary | ICD-10-CM | POA: Diagnosis present

## 2022-09-22 DIAGNOSIS — A419 Sepsis, unspecified organism: Secondary | ICD-10-CM | POA: Diagnosis present

## 2022-09-22 DIAGNOSIS — K581 Irritable bowel syndrome with constipation: Secondary | ICD-10-CM | POA: Diagnosis present

## 2022-09-22 DIAGNOSIS — D6859 Other primary thrombophilia: Secondary | ICD-10-CM | POA: Diagnosis present

## 2022-09-22 DIAGNOSIS — M109 Gout, unspecified: Secondary | ICD-10-CM | POA: Diagnosis present

## 2022-09-22 DIAGNOSIS — Z79899 Other long term (current) drug therapy: Secondary | ICD-10-CM

## 2022-09-22 LAB — LACTIC ACID, PLASMA
Lactic Acid, Venous: 2.3 mmol/L (ref 0.5–1.9)
Lactic Acid, Venous: 2.6 mmol/L (ref 0.5–1.9)
Lactic Acid, Venous: 0.9 mmol/L (ref 0.5–1.9)

## 2022-09-22 LAB — URINALYSIS, ROUTINE W REFLEX MICROSCOPIC
Bacteria, UA: NONE SEEN
Bilirubin Urine: NEGATIVE
Glucose, UA: NEGATIVE mg/dL
Ketones, ur: 20 mg/dL — AB
Nitrite: NEGATIVE
Protein, ur: NEGATIVE mg/dL
Specific Gravity, Urine: 1.003 — ABNORMAL LOW (ref 1.005–1.030)
pH: 6 (ref 5.0–8.0)

## 2022-09-22 LAB — CBC WITH DIFFERENTIAL/PLATELET
Abs Immature Granulocytes: 0.07 10*3/uL (ref 0.00–0.07)
Basophils Absolute: 0 10*3/uL (ref 0.0–0.1)
Basophils Relative: 0 %
Eosinophils Absolute: 0 10*3/uL (ref 0.0–0.5)
Eosinophils Relative: 0 %
HCT: 34.8 % — ABNORMAL LOW (ref 36.0–46.0)
Hemoglobin: 11 g/dL — ABNORMAL LOW (ref 12.0–15.0)
Immature Granulocytes: 0 %
Lymphocytes Relative: 7 %
Lymphs Abs: 1.2 10*3/uL (ref 0.7–4.0)
MCH: 30.9 pg (ref 26.0–34.0)
MCHC: 31.6 g/dL (ref 30.0–36.0)
MCV: 97.8 fL (ref 80.0–100.0)
Monocytes Absolute: 1.8 10*3/uL — ABNORMAL HIGH (ref 0.1–1.0)
Monocytes Relative: 12 %
Neutro Abs: 12.7 10*3/uL — ABNORMAL HIGH (ref 1.7–7.7)
Neutrophils Relative %: 81 %
Platelets: 173 10*3/uL (ref 150–400)
RBC: 3.56 MIL/uL — ABNORMAL LOW (ref 3.87–5.11)
RDW: 12.8 % (ref 11.5–15.5)
WBC: 15.8 10*3/uL — ABNORMAL HIGH (ref 4.0–10.5)
nRBC: 0 % (ref 0.0–0.2)

## 2022-09-22 LAB — COMPREHENSIVE METABOLIC PANEL
ALT: 16 U/L (ref 0–44)
AST: 27 U/L (ref 15–41)
Albumin: 3.4 g/dL — ABNORMAL LOW (ref 3.5–5.0)
Alkaline Phosphatase: 41 U/L (ref 38–126)
Anion gap: 11 (ref 5–15)
BUN: 17 mg/dL (ref 8–23)
CO2: 22 mmol/L (ref 22–32)
Calcium: 9.5 mg/dL (ref 8.9–10.3)
Chloride: 97 mmol/L — ABNORMAL LOW (ref 98–111)
Creatinine, Ser: 0.97 mg/dL (ref 0.44–1.00)
GFR, Estimated: >60 mL/min (ref >60)
Glucose, Bld: 104 mg/dL — ABNORMAL HIGH (ref 70–99)
Potassium: 4.3 mmol/L (ref 3.5–5.1)
Sodium: 130 mmol/L — ABNORMAL LOW (ref 135–145)
Total Bilirubin: 1.5 mg/dL — ABNORMAL HIGH (ref 0.3–1.2)
Total Protein: 7.6 g/dL (ref 6.5–8.1)

## 2022-09-22 LAB — LIPASE, BLOOD: Lipase: 40 U/L (ref 11–51)

## 2022-09-22 LAB — SARS CORONAVIRUS 2 BY RT PCR: SARS Coronavirus 2 by RT PCR: NEGATIVE

## 2022-09-22 MED ORDER — ACETAMINOPHEN 325 MG PO TABS
650.0000 mg | ORAL_TABLET | Freq: Four times a day (QID) | ORAL | Status: DC | PRN
  Administered 2022-09-22 – 2022-09-24 (×5): 650 mg via ORAL
  Filled 2022-09-22 (×5): qty 2

## 2022-09-22 MED ORDER — SENNOSIDES-DOCUSATE SODIUM 8.6-50 MG PO TABS
2.0000 | ORAL_TABLET | Freq: Every day | ORAL | Status: DC
  Administered 2022-09-22 – 2022-09-23 (×2): 2 via ORAL
  Filled 2022-09-22 (×2): qty 2

## 2022-09-22 MED ORDER — ONDANSETRON HCL 4 MG/2ML IJ SOLN
4.0000 mg | Freq: Once | INTRAMUSCULAR | Status: AC
  Administered 2022-09-22: 4 mg via INTRAVENOUS
  Filled 2022-09-22: qty 2

## 2022-09-22 MED ORDER — ONDANSETRON HCL 4 MG PO TABS: 4.0000 mg | ORAL_TABLET | Freq: Four times a day (QID) | ORAL | Status: DC | PRN

## 2022-09-22 MED ORDER — SODIUM CHLORIDE 0.9 % IV BOLUS
1000.0000 mL | Freq: Once | INTRAVENOUS | Status: AC
  Administered 2022-09-22: 1000 mL via INTRAVENOUS

## 2022-09-22 MED ORDER — PROCHLORPERAZINE 25 MG RE SUPP
25.0000 mg | Freq: Once | RECTAL | Status: AC
  Administered 2022-09-22: 25 mg via RECTAL
  Filled 2022-09-22: qty 1

## 2022-09-22 MED ORDER — SODIUM CHLORIDE 0.9% FLUSH
3.0000 mL | Freq: Two times a day (BID) | INTRAVENOUS | Status: DC
  Administered 2022-09-23 – 2022-09-24 (×2): 3 mL via INTRAVENOUS

## 2022-09-22 MED ORDER — ONDANSETRON HCL 4 MG/2ML IJ SOLN
4.0000 mg | Freq: Four times a day (QID) | INTRAMUSCULAR | Status: DC | PRN
  Administered 2022-09-23 (×2): 4 mg via INTRAVENOUS
  Filled 2022-09-22 (×2): qty 2

## 2022-09-22 MED ORDER — PANTOPRAZOLE SODIUM 40 MG PO TBEC
40.0000 mg | DELAYED_RELEASE_TABLET | Freq: Every day | ORAL | Status: DC
  Administered 2022-09-23 – 2022-09-24 (×2): 40 mg via ORAL
  Filled 2022-09-22 (×2): qty 1

## 2022-09-22 MED ORDER — POLYETHYLENE GLYCOL 3350 17 G PO PACK
17.0000 g | PACK | Freq: Every day | ORAL | Status: DC
  Administered 2022-09-22: 17 g via ORAL
  Filled 2022-09-22: qty 1

## 2022-09-22 MED ORDER — FENTANYL CITRATE PF 50 MCG/ML IJ SOSY
50.0000 ug | PREFILLED_SYRINGE | Freq: Once | INTRAMUSCULAR | Status: AC
  Administered 2022-09-22: 50 ug via INTRAVENOUS
  Filled 2022-09-22: qty 1

## 2022-09-22 MED ORDER — SODIUM CHLORIDE 0.9% FLUSH
3.0000 mL | Freq: Two times a day (BID) | INTRAVENOUS | Status: DC
  Administered 2022-09-23 – 2022-09-24 (×3): 3 mL via INTRAVENOUS

## 2022-09-22 MED ORDER — TRAZODONE HCL 50 MG PO TABS: 50.0000 mg | ORAL_TABLET | Freq: Every evening | ORAL | Status: DC | PRN

## 2022-09-22 MED ORDER — LINACLOTIDE 145 MCG PO CAPS
145.0000 ug | ORAL_CAPSULE | Freq: Every day | ORAL | Status: DC
  Filled 2022-09-22 (×3): qty 1

## 2022-09-22 MED ORDER — POLYETHYLENE GLYCOL 3350 17 G PO PACK
17.0000 g | PACK | Freq: Two times a day (BID) | ORAL | Status: DC
  Administered 2022-09-22 – 2022-09-24 (×4): 17 g via ORAL
  Filled 2022-09-22 (×4): qty 1

## 2022-09-22 MED ORDER — PROCHLORPERAZINE EDISYLATE 10 MG/2ML IJ SOLN
10.0000 mg | Freq: Once | INTRAMUSCULAR | Status: AC
  Administered 2022-09-22: 10 mg via INTRAVENOUS
  Filled 2022-09-22: qty 2

## 2022-09-22 MED ORDER — SODIUM CHLORIDE 0.9 % IV SOLN
1.0000 g | INTRAVENOUS | Status: DC
  Administered 2022-09-23 – 2022-09-24 (×2): 1 g via INTRAVENOUS
  Filled 2022-09-22 (×2): qty 10

## 2022-09-22 MED ORDER — LACTATED RINGERS IV SOLN: INTRAVENOUS | Status: DC

## 2022-09-22 MED ORDER — LACTULOSE 10 GM/15ML PO SOLN
30.0000 g | Freq: Once | ORAL | Status: AC
  Administered 2022-09-22: 30 g via ORAL
  Filled 2022-09-22: qty 60

## 2022-09-22 MED ORDER — MORPHINE SULFATE (PF) 4 MG/ML IV SOLN
4.0000 mg | Freq: Once | INTRAVENOUS | Status: AC
  Administered 2022-09-22: 4 mg via INTRAVENOUS
  Filled 2022-09-22: qty 1

## 2022-09-22 MED ORDER — SODIUM CHLORIDE 0.9% FLUSH: 3.0000 mL | INTRAVENOUS | Status: DC | PRN

## 2022-09-22 MED ORDER — RIVAROXABAN 10 MG PO TABS: 10.0000 mg | ORAL_TABLET | Freq: Every day | ORAL | Status: DC

## 2022-09-22 MED ORDER — SODIUM CHLORIDE 0.9 % IV SOLN: INTRAVENOUS | Status: DC | PRN

## 2022-09-22 MED ORDER — LACTATED RINGERS IV BOLUS
1000.0000 mL | Freq: Once | INTRAVENOUS | Status: AC
  Administered 2022-09-22: 1000 mL via INTRAVENOUS

## 2022-09-22 MED ORDER — BISACODYL 10 MG RE SUPP: 10.0000 mg | Freq: Every day | RECTAL | Status: DC | PRN

## 2022-09-22 MED ORDER — DULOXETINE HCL 30 MG PO CPEP
60.0000 mg | ORAL_CAPSULE | Freq: Every day | ORAL | Status: DC
  Administered 2022-09-23 – 2022-09-24 (×2): 60 mg via ORAL
  Filled 2022-09-22 (×2): qty 2

## 2022-09-22 MED ORDER — ACETAMINOPHEN 650 MG RE SUPP: 650.0000 mg | Freq: Four times a day (QID) | RECTAL | Status: DC | PRN

## 2022-09-22 MED ORDER — LEVOFLOXACIN IN D5W 750 MG/150ML IV SOLN
750.0000 mg | Freq: Once | INTRAVENOUS | Status: AC
  Administered 2022-09-22: 750 mg via INTRAVENOUS
  Filled 2022-09-22: qty 150

## 2022-09-22 MED ORDER — HYDRALAZINE HCL 20 MG/ML IJ SOLN: 10.0000 mg | Freq: Four times a day (QID) | INTRAMUSCULAR | Status: DC | PRN

## 2022-09-22 MED ORDER — RIVAROXABAN 10 MG PO TABS
10.0000 mg | ORAL_TABLET | Freq: Every day | ORAL | Status: DC
  Administered 2022-09-23: 10 mg via ORAL
  Filled 2022-09-22: qty 1

## 2022-09-22 MED ORDER — ROSUVASTATIN CALCIUM 5 MG PO TABS: 5.0000 mg | ORAL_TABLET | ORAL | Status: DC

## 2022-09-22 MED ORDER — LEVOTHYROXINE SODIUM 50 MCG PO TABS
50.0000 ug | ORAL_TABLET | Freq: Every day | ORAL | Status: DC
  Administered 2022-09-23 – 2022-09-24 (×2): 50 ug via ORAL
  Filled 2022-09-22 (×2): qty 1

## 2022-09-22 MED ORDER — BISACODYL 10 MG RE SUPP
10.0000 mg | Freq: Every day | RECTAL | Status: DC
  Administered 2022-09-22 – 2022-09-23 (×2): 10 mg via RECTAL
  Filled 2022-09-22 (×2): qty 1

## 2022-09-22 MED ORDER — LORAZEPAM 1 MG PO TABS: 1.0000 mg | ORAL_TABLET | Freq: Two times a day (BID) | ORAL | Status: DC | PRN

## 2022-09-22 NOTE — ED Triage Notes (Signed)
Pt states she was seen at her PCP yesterday and diagnosed with a UTI and started on antibiotics but now pt states she has no control over her bladder and will have episodes of incontinence  Pt states she has not had a BM in 2 weeks and needs to have help with having one

## 2022-09-22 NOTE — Sepsis Progress Note (Signed)
eLink is following this Code Sepsis. °

## 2022-09-22 NOTE — Sepsis Progress Note (Addendum)
Notified provider of need to order repeat lactic acid as the second LA was higher than the first..  Bedside RN on surgical unit made aware of need to redraw via secure chat with Dr Mariea Clonts.

## 2022-09-22 NOTE — Consult Note (Signed)
CODE SEPSIS - PHARMACY COMMUNICATION  **Broad-spectrum antimicrobials should be administered within one hour of sepsis diagnosis**  Time Code Sepsis call or page was received: 1430  Antibiotics ordered: Levofloxacin  Time of first antibiotic administration: 1450  Additional action taken by pharmacy: N/A  If necessary, name of provider/nurse contacted: N/A    Will M. Dareen Piano, PharmD Clinical Pharmacist 09/22/2022 2:50 PM

## 2022-09-22 NOTE — ED Provider Notes (Cosign Needed Addendum)
[place] EMERGENCY DEPARTMENT AT [facility] Provider Note   CSN: [id number] Arrival date & time: 09/22/22  0865     History  Chief Complaint  Patient presents with   Urinary Frequency    Sherry Levine is a 77 y.o. female.  PMH of pemphigus vulgaris, DVT and PE on Xarelto.  Presents the ER with urinary frequency, urinary incontinence over the past week, worse since yesterday, started on Macrobid yesterday for UTI from her primary care doctor.  She is here with a family friend at the bedside who helps provide history.  She notes that the patient woke up this morning and was slightly confused as to what day it was, had stated she had sweats last night and is having left flank pain and they are concerned about more severe infection.  She is also having nausea and vomiting.  Patient also notes she has had constipation with no bowel movement x 2 weeks.   Urinary Frequency       Home Medications Prior to Admission medications   Medication Sig Start Date End Date Taking? Authorizing Provider  acetaminophen (TYLENOL) 500 MG tablet Take 1,000 mg by mouth every 6 (six) hours.    [provider]  cetirizine (ZYRTEC) 10 MG tablet TAKE 1 TABLET BY MOUTH ONCE DAILY 04/16/17   Elenora Gamma, MD  DULoxetine (CYMBALTA) 60 MG capsule TAKE 1 CAPSULE BY MOUTH ONCE DAILY 01/30/17   Elenora Gamma, MD  gabapentin (NEURONTIN) 300 MG capsule Take 1 capsule by mouth 3 (three) times daily. 01/22/18   [provider]  levothyroxine (SYNTHROID, LEVOTHROID) 50 MCG tablet TAKE 1 TABLET(50 MCG) BY MOUTH DAILY 10/08/17   Mechele Claude, MD  LORazepam (ATIVAN) 1 MG tablet TAKE 1 TABLET BY MOUTH AT BEDTIME IF NEEDED 01/30/17   Elenora Gamma, MD  methocarbamol (ROBAXIN) 500 MG tablet TAKE 1 TABLET BY MOUTH FOUR TIMES DAILY FOR 10 DAYS    [provider]  pantoprazole (PROTONIX) 40 MG tablet TAKE 1 TABLET BY MOUTH ONCE DAILY 01/30/17   Elenora Gamma, MD  polyethylene  glycol (MIRALAX / GLYCOLAX) packet Take 17 g by mouth daily. 07/05/15   Meredeth Ide, MD  rosuvastatin (CRESTOR) 5 MG tablet TAKE 1 TABLET BY MOUTH EVERY OTHER DAY 05/30/16   Elenora Gamma, MD  Vitamin D, Ergocalciferol, (DRISDOL) 50000 units CAPS capsule TAKE 1 CAPSULE BY MOUTH ONCE WEEKLY 09/28/16   Elenora Gamma, MD  XARELTO 10 MG TABS tablet TAKE 1 TABLET BY MOUTH ONCE DAILY 05/01/17   Elenora Gamma, MD      Allergies    Celebrex [celecoxib], Colchicine, Lyrica [pregabalin], Omnicef [cefdinir], Penicillins, Zithromax [azithromycin], and Codeine    Review of Systems   Review of Systems  Genitourinary:  Positive for frequency.    Physical Exam Updated Vital Signs BP (!) 180/94   Pulse 77   Temp (!) 100.5 F (38.1 C) (Rectal)   Resp 15   Ht 5\' 3"  (1.6 m)   Wt 68.9 kg   LMP 04/05/1982   SpO2 99%   BMI 26.93 kg/m  Physical Exam Vitals and nursing note reviewed.  Constitutional:      General: She is not in acute distress.    Appearance: She is well-developed.  HENT:     Head: Normocephalic and atraumatic.  Eyes:     Conjunctiva/sclera: Conjunctivae normal.  Cardiovascular:     Rate and Rhythm: Normal rate and regular rhythm.  Heart sounds: No murmur heard. Pulmonary:     Effort: Pulmonary effort is normal. No respiratory distress.     Breath sounds: Normal breath sounds.  Abdominal:     Palpations: Abdomen is soft.     Tenderness: There is abdominal tenderness in the epigastric area. There is left CVA tenderness. There is no guarding or rebound.  Musculoskeletal:        General: No swelling.     Cervical back: Neck supple.  Skin:    General: Skin is warm and dry.     Capillary Refill: Capillary refill takes less than 2 seconds.  Neurological:     General: No focal deficit present.     Mental Status: She is alert and oriented to person, place, and time.  Psychiatric:        Mood and Affect: Mood normal.        Behavior: Behavior normal.     ED  Results / Procedures / Treatments   Labs (all labs ordered are listed, but only abnormal results are displayed) Labs Reviewed  URINALYSIS, ROUTINE W REFLEX MICROSCOPIC - Abnormal; Notable for the following components:      Result Value   Specific Gravity, Urine 1.003 (*)    Hgb urine dipstick MODERATE (*)    Ketones, ur 20 (*)    Leukocytes,Ua TRACE (*)    All other components within normal limits  COMPREHENSIVE METABOLIC PANEL - Abnormal; Notable for the following components:   Sodium 130 (*)    Chloride 97 (*)    Glucose, Bld 104 (*)    Albumin 3.4 (*)    Total Bilirubin 1.5 (*)    All other components within normal limits  CBC WITH DIFFERENTIAL/PLATELET - Abnormal; Notable for the following components:   WBC 15.8 (*)    RBC 3.56 (*)    Hemoglobin 11.0 (*)    HCT 34.8 (*)    Neutro Abs 12.7 (*)    Monocytes Absolute 1.8 (*)    All other components within normal limits  URINE CULTURE  CULTURE, BLOOD (SINGLE)  CULTURE, BLOOD (ROUTINE X 2)  CULTURE, BLOOD (ROUTINE X 2)  SARS CORONAVIRUS 2 BY RT PCR  LIPASE, BLOOD  CBC WITH DIFFERENTIAL/PLATELET  LACTIC ACID, PLASMA  LACTIC ACID, PLASMA    EKG None  Radiology CT ABDOMEN PELVIS WO CONTRAST  Result Date: 09/22/2022 CLINICAL DATA:  Acute, non localized abdominal pain. Urinary incontinence. EXAM: CT ABDOMEN AND PELVIS WITHOUT CONTRAST TECHNIQUE: Multidetector CT imaging of the abdomen and pelvis was performed following the standard protocol without IV contrast. RADIATION DOSE REDUCTION: This exam was performed according to the departmental dose-optimization program which includes automated exposure control, adjustment of the mA and/or kV according to patient size and/or use of iterative reconstruction technique. COMPARISON:  08/18/2015 and 11/05/2015.  CTA chest dated 01/23/2012. FINDINGS: Lower chest: Large hiatal hernia is again demonstrated. Normal sized heart. No interval findings suspicious for malignancy in either breast.  5.6 x4.3 mm left lower lobe nodule on image number 14/4 with a mean diameter of just under 5 mm. Decreased atelectasis in both lower lobes with the exception of increased amount in the anterior left lower lobe on the 1st few images, not included in its entirety. Small amount of left posterior pleural thickening. Tiny right lower lobe calcified granuloma. Hepatobiliary: No focal liver abnormality is seen. Status post cholecystectomy. No biliary dilatation. Pancreas: Unremarkable. No pancreatic ductal dilatation or surrounding inflammatory changes. Spleen: Normal in size without focal abnormality. 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 Additional dense arterial calcifications including the origins of both renal arteries. Unremarkable adrenal glands, kidneys, ureters and urinary bladder. Stomach/Bowel: Large hiatal hernia. Appendix appears normal. No evidence of bowel wall thickening, distention, or inflammatory changes. 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 No enlarged lymph nodes. Reproductive: Status post hysterectomy. No adnexal masses. Other: Tiny umbilical hernia containing fat. Musculoskeletal: Right hip prosthesis. Lumbar and lower thoracic spine degenerative changes and mild scoliosis. IMPRESSION: 1. No acute abnormality. 2. Interval 5 mm left lower lobe nodule. No follow-up needed if patient is low-risk. Non-contrast chest CT can be considered in 12 months if patient is high-risk. This recommendation follows the consensus statement: Guidelines for Management of Incidental Pulmonary Nodules Detected on CT Images: From the Fleischner Society 2017; Radiology 2017; 284:228-243. 3. Large hiatal hernia. 4. 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 5. Tiny umbilical hernia containing fat.  Electronically Signed   By: Beckie Salts M.D.   On: 09/22/2022 12:52    Procedures .Critical Care  Performed by: Ma Rings, PA-C Authorized by: Ma Rings, PA-C   Critical care provider statement:    Critical care time (minutes):  30   Critical care was time spent personally by me on the following activities:  Development of treatment plan with patient or surrogate, discussions with consultants, evaluation of patient's response to treatment, examination of patient, ordering and review of laboratory studies, ordering and review of radiographic studies, ordering and performing treatments and interventions, pulse oximetry, re-evaluation of patient's condition, review of old charts and obtaining history from patient or surrogate   Care discussed with: admitting provider       Medications Ordered in ED Medications  lactated ringers infusion ( Intravenous New Bag/Given 09/22/22 1438)  levofloxacin (LEVAQUIN) IVPB 750 mg (750 mg Intravenous New Bag/Given 09/22/22 1451)  hydrALAZINE (APRESOLINE) injection 10 mg (has no administration in time range)  cefTRIAXone (ROCEPHIN) 1 g in sodium chloride 0.9 % 100 mL IVPB (has no administration in time range)  rosuvastatin (CRESTOR) tablet 5 mg (has no administration in time range)  DULoxetine (CYMBALTA) DR capsule 60 mg (has no administration in time range)  LORazepam (ATIVAN) tablet 1 mg (has no administration in time range)  levothyroxine (SYNTHROID) tablet 50 mcg (has no administration in time range)  pantoprazole (PROTONIX) EC tablet 40 mg (has no administration in time range)  polyethylene glycol (MIRALAX / GLYCOLAX) packet 17 g (has no administration in time range)  rivaroxaban (XARELTO) tablet 10 mg (has no administration in time range)  sodium chloride flush (NS) 0.9 % injection 3 mL (has no administration in time range)  sodium chloride flush (NS) 0.9 % injection 3 mL (has no administration in time range)  sodium chloride flush (NS)  0.9 % injection 3 mL (has no administration in time range)  0.9 %  sodium chloride infusion (has no administration in time range)  acetaminophen (TYLENOL) tablet 650 mg (has no administration in time range)    Or  acetaminophen (TYLENOL) suppository 650 mg (has no administration in time range)  traZODone (DESYREL) tablet 50 mg (has no administration in time range)  bisacodyl (DULCOLAX) suppository 10 mg (has no administration in time range)  ondansetron (ZOFRAN) tablet 4 mg (has no administration in time range)    Or  ondansetron (ZOFRAN) injection 4 mg (has no administration in time  range)  lactated ringers bolus 1,000 mL (0 mLs Intravenous Stopped 09/22/22 1416)  ondansetron (ZOFRAN) injection 4 mg (4 mg Intravenous Given 09/22/22 1117)  fentaNYL (SUBLIMAZE) injection 50 mcg (50 mcg Intravenous Given 09/22/22 1117)  morphine (PF) 4 MG/ML injection 4 mg (4 mg Intravenous Given 09/22/22 1438)  ondansetron (ZOFRAN) injection 4 mg (4 mg Intravenous Given 09/22/22 1450)    ED Course/ Medical Decision Making/ A&P Clinical Course as of 09/22/22 1507  Fri Sep 22, 2022  1452 Here for left flank pain, was having some sweats last night, initially [CB]    Clinical Course User Index [CB] Ma Rings, PA-C                                 Medical Decision Making This patient presents to the ED for concern of flank pain, sweats, this involves an extensive number of treatment options, and is a complaint that carries with it a high risk of complications and morbidity.  The differential diagnosis includes headache, kidney stone, pyelonephritis, sepsis, cellulitis, dental infection, viral syndrome, pneumonia, other   Co morbidities that complicate the patient evaluation :   Pemphigus vulgaris, VTE   Additional history obtained:  Additional history obtained from EMR External records from outside source obtained and reviewed including notes   Lab Tests:  I Ordered, and personally interpreted labs.   The pertinent results include: Leukocytosis of 15.8, mild hyponatremia and hypochloremia, patient does have small leukocytes, 10 white blood cells on UA with no bacteria   Imaging Studies ordered:  I ordered imaging studies including the abdomen pelvis which shows no obstructive uropathy, no perinephric stranding to suggest pyelonephritis, no bowel obstruction, no other acute findings I independently visualized and interpreted imaging within scope of identifying emergent findings  I agree with the radiologist interpretation   Cardiac Monitoring: / EKG:  The patient was maintained on a cardiac monitor.  I personally viewed and interpreted the cardiac monitored which showed an underlying rhythm of: Sinus rhythm   Consultations Obtained:  I requested consultation with the hospitalist, Dr. Mariea Clonts,  and discussed lab and imaging findings as well as pertinent plan - they recommend: admission- will see pt in the ED   Problem List / ED Course / Critical interventions / Medication management  Patient with sweats, flank pain, nausea and vomiting, dysuria frequency and hematuria for the past week, started antibiotics yesterday-Macrobid is having flank pain now as well, had some sweats last night.  Initially not meeting sepsis criteria but did have leukocytosis.  Repeat temp check was oral 99 so rectal temperature was done and has temp of 100.5 Fahrenheit, this does meet sepsis criteria, urinalysis shows 16 white blood cells no bacteria but patient has trace leuks leukocytes and also has antibiotics so could be partially treated.  I do not find any other source of infection, she had some dental work about a month ago and has been having pain from her left jaw to her left for hard since then but there is no facial or neck swelling or tenderness on exam to suggest that this is the source of infection.  Acid ordered but pending at time of admission.  Patient got initial IV fluid bolus of 1 L of LR.  Blood  pressure has not been low, so no further fluids ordered until lactic acid will be returned.  Patient given Levaquin for antibiotics due to her cephalosporin allergy Consulted with Dr.  Emokpae who will admit I ordered medication including fentanyl  for pain  Reevaluation of the patient after these medicines showed that the patient improved I have reviewed the patients home medicines and have made adjustments as needed   Social Determinants of Health:  Patient lives alone      Amount and/or Complexity of Data Reviewed Labs: ordered. Radiology: ordered.  Risk Prescription drug management. Decision regarding hospitalization.           Final Clinical Impression(s) / ED Diagnoses Final diagnoses:  None    Rx / DC Orders ED Discharge Orders     None         Ma Rings, PA-C 09/22/22 [address], PA-C 09/22/22 1926    Loetta Rough, MD 09/24/22 1909

## 2022-09-22 NOTE — ED Notes (Signed)
Attempted to call report. Nurse is with another patient and will return call

## 2022-09-22 NOTE — H&P (Signed)
Patient Demographics:    Sherry Levine, is a 77 y.o. female  MRN: [id number]   DOB - Dec 27, 1945  Admit Date - 09/22/2022  Outpatient Primary MD for the patient is Pcp, No   Assessment & Plan:   Assessment and Plan:  1) sepsis secondary to presumed urinary source---POA -Failed p.o. Macrobid prior to admission -Tmax 100.5, respiratory rate 20 , heart rate 92 -WBC 15.8 -Elevated lactic acid noted -CT abdomen and pelvis without acute findings -Continue IV Rocephin and IV fluids pending further culture data  2) possible UTI---UA is not particularly convincing for UTI but patient has been taking Macrobid for the last couple days -Management as above #1  3) intractable emesis--- suspect due to #1 #2 above -As needed antiemetics as ordered  4) hyponatremia/hypochloremia--- due to #3 above -Hydrate and treat nausea and vomiting  5) chronic anticoagulation/hypercoagulable state--- continue Xarelto as ordered at 10 mg nightly for DVT prophylaxis  6) IBS-C with chronic constipation--- no BM in at least 2 weeks -CT abdomen and pelvis without acute changes -Laxatives as ordered -Start Linzess in a.m.  7) hypothyroidism--- continue levothyroxine  Status is: Inpatient  Remains inpatient appropriate because:   Dispo: The patient is from: Home              Anticipated d/c is to: Home              Anticipated d/c date is: 2 days              Patient currently is not medically stable to d/c. Barriers: Not Clinically Stable-    With History of - Reviewed by me  Past Medical History:  Diagnosis Date   Allergy    Anxiety    Clotting disorder (HCC)    Diverticulitis    DVT (deep venous thrombosis) (HCC)    Fibromyalgia    Gout    Osteoarthritis    Osteoarthritis (arthritis due to wear and tear of joints)     Plantar fasciitis    Pulmonary embolism (HCC)    Thyroid disease       Past Surgical History:  Procedure Laterality Date   ABDOMINAL HYSTERECTOMY     carpel tunnel     right hand   CERVICAL FUSION     CHOLECYSTECTOMY  2015   EYE SURGERY Bilateral    cataracts   KNEE ARTHROSCOPY WITH LATERAL MENISECTOMY     right knee      Chief Complaint  Patient presents with   Urinary Frequency      HPI:    Sherry Levine  is a 77 y.o. female with past medical history relevant for history of prior DVT/PE and hypercoagulable state who is chronically on Xarelto as well as history of anxiety disorder osteoarthritis and gout and hypothyroidism who presents to the ED with complaints of hematuria and dysuria since 09/19/2022. -No BM since 2 weeks ago the patient has chronic constipation with presumed IBS-C -Low-grade temperatures -  in The ED temp is 100.5 -Additional history obtained from patient's friend [name] at bedside -Patient has had persistent emesis since 09/19/2022 emesis without bile or blood -She does have abdominal discomfort, no flank pain per se -In the ED COVID test is negative -Chest x-ray pending -CT abdomen and pelvis without acute findings CBC with a white count of 15.8 hemoglobin is 11.0 -Lipase is 40 -UA is not particularly convincing for UTI but patient has been taking Macrobid for the last couple days -Elevated lactic acid noted Sodium is 130, chloride is 97, T. bili 1.5 otherwise LFTs are not elevated   Review of systems:    In addition to the HPI above,   A full Review of  Systems was done, all other systems reviewed are negative except as noted above in HPI , .    Social History:  Reviewed by me    Social History   Tobacco Use   Smoking status: Never   Smokeless tobacco: Never  Substance Use Topics   Alcohol use: No       Family History :  Reviewed by me    Family History  Problem Relation Age of Onset   Stroke Mother    Heart disease Mother     Stroke Father    Heart disease Father      Home Medications:   Prior to Admission medications   Medication Sig Start Date End Date Taking? Authorizing Provider  acetaminophen (TYLENOL) 500 MG tablet Take 1,000 mg by mouth every 6 (six) hours as needed for moderate pain.   Yes [provider]  cetirizine (ZYRTEC) 10 MG tablet TAKE 1 TABLET BY MOUTH ONCE DAILY 04/16/17  Yes Elenora Gamma, MD  cyanocobalamin (VITAMIN B12) 1000 MCG/ML injection Inject 1,000 mcg into the muscle every 30 (thirty) days. 08/28/22  Yes [provider]  DULoxetine (CYMBALTA) 60 MG capsule TAKE 1 CAPSULE BY MOUTH ONCE DAILY 01/30/17  Yes Elenora Gamma, MD  gabapentin (NEURONTIN) 300 MG capsule Take 300 mg by mouth 3 (three) times daily. 01/22/18  Yes [provider]  levothyroxine (SYNTHROID, LEVOTHROID) 50 MCG tablet TAKE 1 TABLET(50 MCG) BY MOUTH DAILY 10/08/17  Yes Stacks, Broadus John, MD  LORazepam (ATIVAN) 1 MG tablet TAKE 1 TABLET BY MOUTH AT BEDTIME IF NEEDED 01/30/17  Yes Elenora Gamma, MD  nitrofurantoin, macrocrystal-monohydrate, (MACROBID) 100 MG capsule Take 100 mg by mouth 2 (two) times daily. 09/20/22  Yes [provider]  ondansetron (ZOFRAN-ODT) 4 MG disintegrating tablet Take 4 mg by mouth every 8 (eight) hours as needed for vomiting or nausea. 09/08/20  Yes [provider]  pantoprazole (PROTONIX) 40 MG tablet TAKE 1 TABLET BY MOUTH ONCE DAILY 01/30/17  Yes Elenora Gamma, MD  XARELTO 10 MG TABS tablet TAKE 1 TABLET BY MOUTH ONCE DAILY 05/01/17  Yes Elenora Gamma, MD     Allergies:     Allergies  Allergen Reactions   Celebrex [Celecoxib] Anaphylaxis, Hives and Itching   Colchicine Nausea Only   Lyrica [Pregabalin] Other (See Comments)    Dizziness    Omnicef [Cefdinir]     CANT REMEMBER   Penicillins Itching   Zithromax [Azithromycin] Nausea And Vomiting   Codeine Itching and Rash    States that she tolerates vicodin     Physical Exam:    Vitals  Blood pressure 118/67, pulse 92, temperature (!) 100.4 F (38 C), temperature source Oral, resp. rate 18, height 5\' 3"  (1.6 m), weight 68.9 kg, last  menstrual period 04/05/1982, SpO2 98%.  Physical Examination: General appearance - alert,  in no distress  Mental status - alert, oriented to person, place, and time,  Eyes - sclera anicteric Neck - supple, no JVD elevation , Chest - clear  to auscultation bilaterally, symmetrical air movement,  Heart - S1 and S2 normal, regular  Abdomen - soft,  nondistended, +BS, epigastric and generalized abdominal tenderness on palpation without rebound or guarding, no CVA area tenderness Neurological - screening mental status exam normal, neck supple without rigidity, cranial nerves II through XII intact, DTR's normal and symmetric Extremities - no pedal edema noted, intact peripheral pulses  Skin - warm, dry    Data Review:    CBC Recent Labs  Lab 09/22/22 1136  WBC 15.8*  HGB 11.0*  HCT 34.8*  PLT 173  MCV 97.8  MCH 30.9  MCHC 31.6  RDW 12.8  LYMPHSABS 1.2  MONOABS 1.8*  EOSABS 0.0  BASOSABS 0.0   ------------------------------------------------------------------------------------------------------------------  Chemistries  Recent Labs  Lab 09/22/22 1113  NA 130*  K 4.3  CL 97*  CO2 22  GLUCOSE 104*  BUN 17  CREATININE 0.97  CALCIUM 9.5  AST 27  ALT 16  ALKPHOS 41  BILITOT 1.5*   ------------------------------------------------------------------------------------------------------------------ estimated creatinine clearance is 45.2 mL/min (by C-G formula based on SCr of 0.97 mg/dL). ------------------------------------------------------------------------------------------------------------------ ---------------------------------------------------------------------------------------------------------------  Urinalysis    Component Value Date/Time   COLORURINE YELLOW 09/22/2022 1237   APPEARANCEUR CLEAR  09/22/2022 1237   LABSPEC 1.003 (L) 09/22/2022 1237   PHURINE 6.0 09/22/2022 1237   GLUCOSEU NEGATIVE 09/22/2022 1237   HGBUR MODERATE (A) 09/22/2022 1237   BILIRUBINUR NEGATIVE 09/22/2022 1237   KETONESUR 20 (A) 09/22/2022 1237   PROTEINUR NEGATIVE 09/22/2022 1237   NITRITE NEGATIVE 09/22/2022 1237   LEUKOCYTESUR TRACE (A) 09/22/2022 1237    ----------------------------------------------------------------------------------------------------------------   Imaging Results:    CT ABDOMEN PELVIS WO CONTRAST  Result Date: 09/22/2022 CLINICAL DATA:  Acute, non localized abdominal pain. Urinary incontinence. EXAM: CT ABDOMEN AND PELVIS WITHOUT CONTRAST TECHNIQUE: Multidetector CT imaging of the abdomen and pelvis was performed following the standard protocol without IV contrast. RADIATION DOSE REDUCTION: This exam was performed according to the departmental dose-optimization program which includes automated exposure control, adjustment of the mA and/or kV according to patient size and/or use of iterative reconstruction technique. COMPARISON:  08/18/2015 and 11/05/2015.  CTA chest dated 01/23/2012. FINDINGS: Lower chest: Large hiatal hernia is again demonstrated. Normal sized heart. No interval findings suspicious for malignancy in either breast. 5.6 x4.3 mm left lower lobe nodule on image number 14/4 with a mean diameter of just under 5 mm. Decreased atelectasis in both lower lobes with the exception of increased amount in the anterior left lower lobe on the 1st few images, not included in its entirety. Small amount of left posterior pleural thickening. Tiny right lower lobe calcified granuloma. Hepatobiliary: No focal liver abnormality is seen. Status post cholecystectomy. No biliary dilatation. Pancreas: Unremarkable. No pancreatic ductal dilatation or surrounding inflammatory changes. Spleen: Normal in size without focal abnormality. Adrenals/Urinary Tract: Stable calcified right renal artery aneurysm  measuring 1.1 cm in maximum diameter. Additional dense arterial calcifications including the origins of both renal arteries. Unremarkable adrenal glands, kidneys, ureters and urinary bladder. Stomach/Bowel: Large hiatal hernia. Appendix appears normal. No evidence of bowel wall thickening, distention, or inflammatory changes. 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 No enlarged  lymph nodes. Reproductive: Status post hysterectomy. No adnexal masses. Other: Tiny umbilical hernia containing fat. Musculoskeletal: Right hip prosthesis. Lumbar and lower thoracic spine degenerative changes and mild scoliosis. IMPRESSION: 1. No acute abnormality. 2. Interval 5 mm left lower lobe nodule. No follow-up needed if patient is low-risk. Non-contrast chest CT can be considered in 12 months if patient is high-risk. This recommendation follows the consensus statement: Guidelines for Management of Incidental Pulmonary Nodules Detected on CT Images: From the Fleischner Society 2017; Radiology 2017; 284:228-243. 3. Large hiatal hernia. 4. 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 5. Tiny umbilical hernia containing fat. Electronically Signed   By: Beckie Salts M.D.   On: 09/22/2022 12:52    Radiological Exams on Admission: CT ABDOMEN PELVIS WO CONTRAST  Result Date: 09/22/2022 CLINICAL DATA:  Acute, non localized abdominal pain. Urinary incontinence. EXAM: CT ABDOMEN AND PELVIS WITHOUT CONTRAST TECHNIQUE: Multidetector CT imaging of the abdomen and pelvis was performed following the standard protocol without IV contrast. RADIATION DOSE REDUCTION: This exam was performed according to the departmental dose-optimization program which includes automated exposure control, adjustment of the mA and/or kV according to patient size and/or use of iterative reconstruction technique.  COMPARISON:  08/18/2015 and 11/05/2015.  CTA chest dated 01/23/2012. FINDINGS: Lower chest: Large hiatal hernia is again demonstrated. Normal sized heart. No interval findings suspicious for malignancy in either breast. 5.6 x4.3 mm left lower lobe nodule on image number 14/4 with a mean diameter of just under 5 mm. Decreased atelectasis in both lower lobes with the exception of increased amount in the anterior left lower lobe on the 1st few images, not included in its entirety. Small amount of left posterior pleural thickening. Tiny right lower lobe calcified granuloma. Hepatobiliary: No focal liver abnormality is seen. Status post cholecystectomy. No biliary dilatation. Pancreas: Unremarkable. No pancreatic ductal dilatation or surrounding inflammatory changes. Spleen: Normal in size without focal abnormality. Adrenals/Urinary Tract: Stable calcified right renal artery aneurysm measuring 1.1 cm in maximum diameter. Additional dense arterial calcifications including the origins of both renal arteries. Unremarkable adrenal glands, kidneys, ureters and urinary bladder. Stomach/Bowel: Large hiatal hernia. Appendix appears normal. No evidence of bowel wall thickening, distention, or inflammatory changes. 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 No enlarged lymph nodes. Reproductive: Status post hysterectomy. No adnexal masses. Other: Tiny umbilical hernia containing fat. Musculoskeletal: Right hip prosthesis. Lumbar and lower thoracic spine degenerative changes and mild scoliosis. IMPRESSION: 1. No acute abnormality. 2. Interval 5 mm left lower lobe nodule. No follow-up needed if patient is low-risk. Non-contrast chest CT can be considered in 12 months if patient is high-risk. This recommendation follows the consensus statement: Guidelines for Management of Incidental  Pulmonary Nodules Detected on CT Images: From the Fleischner Society 2017; Radiology 2017; 284:228-243. 3. Large hiatal hernia. 4. 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 5. Tiny umbilical hernia containing fat. Electronically Signed   By: Beckie Salts M.D.   On: 09/22/2022 12:52    DVT Prophylaxis -Xarelto AM Labs Ordered, also please review Full Orders  Family Communication: Admission, patients condition and plan of care including tests being ordered have been discussed with the patient and friend [name] at bedside who indicate understanding and agree with the plan   Condition  -stable  Shon Hale M.D on 09/22/2022 at 7:00 PM Go to [email or web address] -  for contact info  [facility] - Office  [phone]  

## 2022-09-22 NOTE — ED Notes (Signed)
Patient vomiting at this time. PA aware.

## 2022-09-23 DIAGNOSIS — N3001 Acute cystitis with hematuria: Secondary | ICD-10-CM | POA: Diagnosis not present

## 2022-09-23 LAB — RENAL FUNCTION PANEL
Albumin: 2.7 g/dL — ABNORMAL LOW (ref 3.5–5.0)
Anion gap: 9 (ref 5–15)
BUN: 10 mg/dL (ref 8–23)
CO2: 23 mmol/L (ref 22–32)
Calcium: 9.1 mg/dL (ref 8.9–10.3)
Chloride: 101 mmol/L (ref 98–111)
Creatinine, Ser: 0.76 mg/dL (ref 0.44–1.00)
GFR, Estimated: >60 mL/min (ref >60)
Glucose, Bld: 97 mg/dL (ref 70–99)
Phosphorus: 2.4 mg/dL — ABNORMAL LOW (ref 2.5–4.6)
Potassium: 3.2 mmol/L — ABNORMAL LOW (ref 3.5–5.1)
Sodium: 133 mmol/L — ABNORMAL LOW (ref 135–145)

## 2022-09-23 LAB — CBC
HCT: 33.2 % — ABNORMAL LOW (ref 36.0–46.0)
Hemoglobin: 10.2 g/dL — ABNORMAL LOW (ref 12.0–15.0)
MCH: 30.5 pg (ref 26.0–34.0)
MCHC: 30.7 g/dL (ref 30.0–36.0)
MCV: 99.4 fL (ref 80.0–100.0)
Platelets: 179 10*3/uL (ref 150–400)
RBC: 3.34 MIL/uL — ABNORMAL LOW (ref 3.87–5.11)
RDW: 12.7 % (ref 11.5–15.5)
WBC: 13.2 10*3/uL — ABNORMAL HIGH (ref 4.0–10.5)
nRBC: 0 % (ref 0.0–0.2)

## 2022-09-23 LAB — URINE CULTURE: Culture: NO GROWTH

## 2022-09-23 MED ORDER — OXYCODONE HCL 5 MG PO TABS
5.0000 mg | ORAL_TABLET | Freq: Once | ORAL | Status: AC | PRN
  Administered 2022-09-23: 5 mg via ORAL
  Filled 2022-09-23: qty 1

## 2022-09-23 MED ORDER — K PHOS MONO-SOD PHOS DI & MONO 155-852-130 MG PO TABS
250.0000 mg | ORAL_TABLET | Freq: Three times a day (TID) | ORAL | Status: DC
  Administered 2022-09-23 – 2022-09-24 (×4): 250 mg via ORAL
  Filled 2022-09-23 (×4): qty 1

## 2022-09-23 MED ORDER — SODIUM CHLORIDE 0.9 % IV SOLN: INTRAVENOUS | Status: DC

## 2022-09-23 MED ORDER — POTASSIUM CHLORIDE CRYS ER 20 MEQ PO TBCR
40.0000 meq | EXTENDED_RELEASE_TABLET | ORAL | Status: AC
  Administered 2022-09-23 (×2): 40 meq via ORAL
  Filled 2022-09-23 (×2): qty 2

## 2022-09-23 NOTE — Progress Notes (Signed)
PROGRESS NOTE     Melissia Salaiz, is a 77 y.o. female, DOB - [birth year], XLK:[id number]  Admit date - 09/22/2022   Admitting Physician Heloise Gordan Mariea Clonts, MD  Outpatient Primary MD for the patient is Pcp, No  LOS - 1  Chief Complaint  Patient presents with   Urinary Frequency        Brief Narrative:  77 y.o. female with past medical history relevant for history of prior DVT/PE and hypercoagulable state who is chronically on Xarelto as well as history of anxiety disorder osteoarthritis and gout and hypothyroidism admitted on 09/22/2022 with concerns for sepsis secondary to urinary source   -Assessment and Plan: 1)Sepsis secondary to presumed urinary source---POA -Failed p.o. Macrobid prior to admission -WBC 15.8 >>13.2 -Elevated lactic acid noted -CT abdomen and pelvis without acute findings -Continue IV Rocephin and IV fluids pending further culture data   2)Possible UTI---UA is not particularly convincing for UTI but patient has been taking Macrobid for the last couple days -Management as above #1   3) intractable emesis--- suspect due to #1 #2 above -Improving nausea and vomiting -As needed antiemetics as ordered   4) hyponatremia/hypochloremia/hypokalemia/hypophosphatemia --- due to #3 above -Hydrate and treat nausea and vomiting -Hydrate and replace electrolytes   5) chronic anticoagulation/hypercoagulable state--- continue Xarelto as ordered at 10 mg nightly for DVT prophylaxis   6) IBS-C with chronic constipation--- no BM in at least 2 weeks prior to admission -Had 2 BMs in the last 12 hours with laxatives -CT abdomen and pelvis without acute changes -Laxatives as ordered -Started on  Linzess in a.m.   7) hypothyroidism--- continue levothyroxine  Status is: Inpatient   Disposition: The patient is from: Home              Anticipated d/c is to: Home              Anticipated d/c date is: 1 day              Patient currently is not medically stable to d/c. Barriers:  Not Clinically Stable-   Code Status :  -  Code Status: Full Code   Family Communication:    NA (patient is alert, awake and coherent  DVT Prophylaxis  :   - SCDs  rivaroxaban (XARELTO) tablet 10 mg Start: 09/23/22 1700 SCDs Start: 09/22/22 1505 Place TED hose Start: 09/22/22 1505 rivaroxaban (XARELTO) tablet 10 mg   Lab Results  Component Value Date   PLT 179 09/23/2022    Inpatient Medications  Scheduled Meds:  bisacodyl  10 mg Rectal QHS   DULoxetine  60 mg Oral Daily   levothyroxine  50 mcg Oral Q0600   linaclotide  145 mcg Oral QAC breakfast   pantoprazole  40 mg Oral Daily   phosphorus  250 mg Oral TID   polyethylene glycol  17 g Oral BID   rivaroxaban  10 mg Oral Q supper   senna-docusate  2 tablet Oral QHS   sodium chloride flush  3 mL Intravenous Q12H   sodium chloride flush  3 mL Intravenous Q12H   Continuous Infusions:  sodium chloride     cefTRIAXone (ROCEPHIN)  IV 1 g (09/23/22 1031)   PRN Meds:.sodium chloride, acetaminophen **OR** acetaminophen, bisacodyl, hydrALAZINE, LORazepam, ondansetron **OR** ondansetron (ZOFRAN) IV, sodium chloride flush, traZODone   Anti-infectives (From admission, onward)    Start     Dose/Rate Route Frequency Ordered Stop   09/23/22 1000  cefTRIAXone (ROCEPHIN) 1 g in sodium chloride 0.9 %  100 mL IVPB        1 g 200 mL/hr over 30 Minutes Intravenous Every 24 hours 09/22/22 1457     09/22/22 1445  levofloxacin (LEVAQUIN) IVPB 750 mg        750 mg 100 mL/hr over 90 Minutes Intravenous  Once 09/22/22 1433 09/22/22 1621         Subjective: Ophelia Charter today has no fevers,  No chest pain,   - No further emesis -Had BM x 2 -No further hematuria, no flank pain   Objective: Vitals:   09/23/22 0426 09/23/22 0922 09/23/22 1121 09/23/22 1540  BP: (!) 141/73 114/64 134/67 137/71  Pulse: 78 83 77 79  Resp: 20 18  17   Temp: 98.6 F (37 C) (!) 101.5 F (38.6 C) 98.8 F (37.1 C) 98.1 F (36.7 C)  TempSrc: Oral Oral Oral    SpO2: 98%  96% 100%  Weight:      Height:        Intake/Output Summary (Last 24 hours) at 09/23/2022 1852 Last data filed at 09/23/2022 1300 Gross per 24 hour  Intake 2655 ml  Output 1000 ml  Net 1655 ml   Filed Weights   09/22/22 0951  Weight: 68.9 kg    Physical Exam  Gen:- Awake Alert, in no apparent distress  HEENT:- [place].AT, No sclera icterus Neck-Supple Neck,No JVD,.  Lungs-  CTAB , fair symmetrical air movement CV- S1, S2 normal, regular  Abd-  +ve B.Sounds, Abd Soft, No tenderness, no CVA area tenderness    Extremity/Skin:- No  edema, pedal pulses present  Psych-affect is appropriate, oriented x3 Neuro-no new focal deficits, no tremors  Data Reviewed: I have personally reviewed following labs and imaging studies  CBC: Recent Labs  Lab 09/22/22 1136 09/23/22 0458  WBC 15.8* 13.2*  NEUTROABS 12.7*  --   HGB 11.0* 10.2*  HCT 34.8* 33.2*  MCV 97.8 99.4  PLT 173 179   Basic Metabolic Panel: Recent Labs  Lab 09/22/22 1113 09/23/22 0458  NA 130* 133*  K 4.3 3.2*  CL 97* 101  CO2 22 23  GLUCOSE 104* 97  BUN 17 10  CREATININE 0.97 0.76  CALCIUM 9.5 9.1  PHOS  --  2.4*   GFR: Estimated Creatinine Clearance: 54.9 mL/min (by C-G formula based on SCr of 0.76 mg/dL). Liver Function Tests: Recent Labs  Lab 09/22/22 1113 09/23/22 0458  AST 27  --   ALT 16  --   ALKPHOS 41  --   BILITOT 1.5*  --   PROT 7.6  --   ALBUMIN 3.4* 2.7*   Recent Results (from the past 240 hour(s))  Urine Culture     Status: None   Collection Time: 09/22/22 12:37 PM   Specimen: Urine, Clean Catch  Result Value Ref Range Status   Specimen Description   Final    URINE, CLEAN CATCH Performed at [facility], [address], [place], [place] [address]    Special Requests   Final    NONE Performed at [facility], [address] [place], [place] [address]    Culture   Final    NO GROWTH Performed at [facility], [address], [place], [place] [address]    Report  Status 09/23/2022 FINAL  Final  Culture, blood (single)     Status: None (Preliminary result)   Collection Time: 09/22/22  2:56 PM   Specimen: BLOOD  Result Value Ref Range Status   Specimen Description BLOOD RIGHT  ANTECUBITAL  Final   Special Requests   Final    BOTTLES DRAWN AEROBIC ONLY Blood Culture adequate volume   Culture   Final    NO GROWTH < 24 HOURS Performed at [facility], [address], [place], [place] [address]    Report Status PENDING  Incomplete  Culture, blood (Routine X 2) w Reflex to ID Panel     Status: None (Preliminary result)   Collection Time: 09/22/22  3:53 PM   Specimen: BLOOD  Result Value Ref Range Status   Specimen Description BLOOD BLOOD RIGHT HAND  Final   Special Requests   Final    BOTTLES DRAWN AEROBIC ONLY Blood Culture results may not be optimal due to an excessive volume of blood received in culture bottles   Culture   Final    NO GROWTH < 24 HOURS Performed at [facility], [address], [place], [place] [address]    Report Status PENDING  Incomplete  SARS Coronavirus 2 by RT PCR (hospital order, performed in [facility] hospital lab) *cepheid single result test* Anterior Nasal Swab     Status: None   Collection Time: 09/22/22  4:00 PM   Specimen: Anterior Nasal Swab  Result Value Ref Range Status   SARS Coronavirus 2 by RT PCR NEGATIVE NEGATIVE Final    Comment: (NOTE) SARS-CoV-2 target nucleic acids are NOT DETECTED.  The SARS-CoV-2 RNA is generally detectable in upper and lower respiratory specimens during the acute phase of infection. The lowest concentration of SARS-CoV-2 viral copies this assay can detect is 250 copies / mL. A negative result does not preclude SARS-CoV-2 infection and should not be used as the sole basis for treatment or other patient management decisions.  A negative result may occur with improper specimen collection / handling, submission of specimen other than nasopharyngeal swab, presence of viral mutation(s)  within the areas targeted by this assay, and inadequate number of viral copies (<250 copies / mL). A negative result must be combined with clinical observations, patient history, and epidemiological information.  Fact Sheet for Patients:   RoadLapTop.co.za  Fact Sheet for Healthcare Providers: http://kim-miller.com/  This test is not yet approved or  cleared by the [place] FDA and has been authorized for detection and/or diagnosis of SARS-CoV-2 by FDA under an Emergency Use Authorization (EUA).  This EUA will remain in effect (meaning this test can be used) for the duration of the COVID-19 declaration under Section 564(b)(1) of the Act, 21 U.S.C. section 360bbb-3(b)(1), unless the authorization is terminated or revoked sooner.  Performed at [facility], [address] [place], [place] [address]     Radiology Studies: DG Chest 2 View  Result Date: 09/22/2022 CLINICAL DATA:  Fever EXAM: CHEST - 2 VIEW COMPARISON:  02/14/2016 FINDINGS: Hardware in the cervicothoracic spine. No acute airspace disease, pleural effusion or pneumothorax. Moderate large hiatal hernia. No pneumothorax. IMPRESSION: No active cardiopulmonary disease. Hiatal hernia. Electronically Signed   By: Jasmine Pang M.D.   On: 09/22/2022 19:37   CT ABDOMEN PELVIS WO CONTRAST  Result Date: 09/22/2022 CLINICAL DATA:  Acute, non localized abdominal pain. Urinary incontinence. EXAM: CT ABDOMEN AND PELVIS WITHOUT CONTRAST TECHNIQUE: Multidetector CT imaging of the abdomen and pelvis was performed following the standard protocol without IV contrast. RADIATION DOSE REDUCTION: This exam was performed according to the departmental dose-optimization program which includes automated exposure control, adjustment of the mA and/or kV according to patient size and/or use of iterative reconstruction technique. COMPARISON:  08/18/2015 and  11/05/2015.  CTA chest dated 01/23/2012. FINDINGS:  Lower chest: Large hiatal hernia is again demonstrated. Normal sized heart. No interval findings suspicious for malignancy in either breast. 5.6 x4.3 mm left lower lobe nodule on image number 14/4 with a mean diameter of just under 5 mm. Decreased atelectasis in both lower lobes with the exception of increased amount in the anterior left lower lobe on the 1st few images, not included in its entirety. Small amount of left posterior pleural thickening. Tiny right lower lobe calcified granuloma. Hepatobiliary: No focal liver abnormality is seen. Status post cholecystectomy. No biliary dilatation. Pancreas: Unremarkable. No pancreatic ductal dilatation or surrounding inflammatory changes. Spleen: Normal in size without focal abnormality. Adrenals/Urinary Tract: Stable calcified right renal artery aneurysm measuring 1.1 cm in maximum diameter. Additional dense arterial calcifications including the origins of both renal arteries. Unremarkable adrenal glands, kidneys, ureters and urinary bladder. Stomach/Bowel: Large hiatal hernia. Appendix appears normal. No evidence of bowel wall thickening, distention, or inflammatory changes. 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 No enlarged lymph nodes. Reproductive: Status post hysterectomy. No adnexal masses. Other: Tiny umbilical hernia containing fat. Musculoskeletal: Right hip prosthesis. Lumbar and lower thoracic spine degenerative changes and mild scoliosis. IMPRESSION: 1. No acute abnormality. 2. Interval 5 mm left lower lobe nodule. No follow-up needed if patient is low-risk. Non-contrast chest CT can be considered in 12 months if patient is high-risk. This recommendation follows the consensus statement: Guidelines for Management of Incidental Pulmonary Nodules Detected on CT Images: From the Fleischner Society 2017;  Radiology 2017; 284:228-243. 3. Large hiatal hernia. 4. 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 5. Tiny umbilical hernia containing fat. Electronically Signed   By: Beckie Salts M.D.   On: 09/22/2022 12:52    Scheduled Meds:  bisacodyl  10 mg Rectal QHS   DULoxetine  60 mg Oral Daily   levothyroxine  50 mcg Oral Q0600   linaclotide  145 mcg Oral QAC breakfast   pantoprazole  40 mg Oral Daily   phosphorus  250 mg Oral TID   polyethylene glycol  17 g Oral BID   rivaroxaban  10 mg Oral Q supper   senna-docusate  2 tablet Oral QHS   sodium chloride flush  3 mL Intravenous Q12H   sodium chloride flush  3 mL Intravenous Q12H   Continuous Infusions:  sodium chloride     cefTRIAXone (ROCEPHIN)  IV 1 g (09/23/22 1031)    LOS: 1 day   Shon Hale M.D on 09/23/2022 at 6:52 PM  Go to [email or web address] - for contact info  [facility] - Office  [phone]  If 7PM-7AM, please contact night-coverage [email or web address] 09/23/2022, 6:52 PM

## 2022-09-23 NOTE — Discharge Instructions (Signed)
  Providers Accepting New Patients in [place], [place]    [facility] [address]  [place], [place] [address] [phone] Accepts most insurances  [facility] [address] [place], [place] [address] [phone] Accepts most insurances  [facility] [place] [address] [place], [place] [address]  [phone] Must meet requirements  [facility] [address]  [place], [place] [address] [phone] Accepts most insurances  [facility] [address]  [place], [place] [address] [phone] Accepts most insurances  [facility] [address]   [place], [place]   [phone] Accepts most insurances  [facility] ([facility])  [address]  [place], [place] [address] [phone] Accepts most insurances     [facility] [address]  [place], [place] [address] [phone] Accepts most insurances  [facility] [address] [place], [place] [address] [phone] option 1 Accepts Medicaid and Uninsured  [facility] [address]  [place], [place] [address] [phone] Accepts most insurances  Tesfaye Fanta, MD [address]  [place], [place] [address] [phone] Accepts most insurances  [facility] [place] [address]  [place], [place] [address] [phone] Accepts most insurances  [facility] [address] [place],  [address] [phone] Accepts most insurances  Zack Hall, MD [address] [place],  [address] [phone]  Accepts most insurances                      

## 2022-09-23 NOTE — Progress Notes (Signed)
   09/23/22 1410  TOC Brief Assessment  Insurance and Status Reviewed  Patient has primary care physician No (List added)  Home environment has been reviewed Home  Prior level of function: independent  Prior/Current Home Services No current home services  Readmission risk has been reviewed Yes  Transition of care needs no transition of care needs at this time   PCP list added to AVS. TOC following.

## 2022-09-24 DIAGNOSIS — N3001 Acute cystitis with hematuria: Secondary | ICD-10-CM | POA: Diagnosis not present

## 2022-09-24 LAB — RENAL FUNCTION PANEL
Albumin: 2.6 g/dL — ABNORMAL LOW (ref 3.5–5.0)
Anion gap: 8 (ref 5–15)
BUN: 8 mg/dL (ref 8–23)
CO2: 24 mmol/L (ref 22–32)
Calcium: 8.7 mg/dL — ABNORMAL LOW (ref 8.9–10.3)
Chloride: 100 mmol/L (ref 98–111)
Creatinine, Ser: 0.76 mg/dL (ref 0.44–1.00)
GFR, Estimated: >60 mL/min (ref >60)
Glucose, Bld: 98 mg/dL (ref 70–99)
Phosphorus: 2.6 mg/dL (ref 2.5–4.6)
Potassium: 3.9 mmol/L (ref 3.5–5.1)
Sodium: 132 mmol/L — ABNORMAL LOW (ref 135–145)

## 2022-09-24 LAB — CBC
HCT: 31.6 % — ABNORMAL LOW (ref 36.0–46.0)
Hemoglobin: 9.9 g/dL — ABNORMAL LOW (ref 12.0–15.0)
MCH: 30.8 pg (ref 26.0–34.0)
MCHC: 31.3 g/dL (ref 30.0–36.0)
MCV: 98.4 fL (ref 80.0–100.0)
Platelets: 165 10*3/uL (ref 150–400)
RBC: 3.21 MIL/uL — ABNORMAL LOW (ref 3.87–5.11)
RDW: 13 % (ref 11.5–15.5)
WBC: 8.6 10*3/uL (ref 4.0–10.5)
nRBC: 0 % (ref 0.0–0.2)

## 2022-09-24 MED ORDER — GABAPENTIN 300 MG PO CAPS: 300.0000 mg | ORAL_CAPSULE | Freq: Three times a day (TID) | ORAL | 3 refills | Status: DC

## 2022-09-24 MED ORDER — LEVOTHYROXINE SODIUM 50 MCG PO TABS: 50.0000 ug | ORAL_TABLET | Freq: Every day | ORAL | 2 refills | Status: AC

## 2022-09-24 MED ORDER — SENNOSIDES-DOCUSATE SODIUM 8.6-50 MG PO TABS: 2.0000 | ORAL_TABLET | Freq: Every day | ORAL | 3 refills | Status: AC

## 2022-09-24 MED ORDER — ACETAMINOPHEN 325 MG PO TABS: 650.0000 mg | ORAL_TABLET | Freq: Four times a day (QID) | ORAL | Status: DC | PRN

## 2022-09-24 MED ORDER — POLYETHYLENE GLYCOL 3350 17 G PO PACK: 17.0000 g | PACK | Freq: Every day | ORAL | 2 refills | Status: DC

## 2022-09-24 MED ORDER — RIVAROXABAN 10 MG PO TABS: 10.0000 mg | ORAL_TABLET | Freq: Every day | ORAL | 2 refills | Status: DC

## 2022-09-24 MED ORDER — PSYLLIUM 48.57 % PO POWD: 1.0000 | Freq: Every day | ORAL | 2 refills | Status: DC

## 2022-09-24 MED ORDER — OXYBUTYNIN CHLORIDE 5 MG PO TABS: 5.0000 mg | ORAL_TABLET | Freq: Two times a day (BID) | ORAL | 2 refills | Status: DC

## 2022-09-24 MED ORDER — OXYBUTYNIN CHLORIDE 5 MG PO TABS
5.0000 mg | ORAL_TABLET | Freq: Two times a day (BID) | ORAL | Status: DC
  Administered 2022-09-24: 5 mg via ORAL
  Filled 2022-09-24: qty 1

## 2022-09-24 MED ORDER — TRAZODONE HCL 50 MG PO TABS: 50.0000 mg | ORAL_TABLET | Freq: Every evening | ORAL | 3 refills | Status: DC | PRN

## 2022-09-24 MED ORDER — DULOXETINE HCL 60 MG PO CPEP: 60.0000 mg | ORAL_CAPSULE | Freq: Every day | ORAL | 5 refills | Status: AC

## 2022-09-24 NOTE — Discharge Summary (Incomplete)
Sherry Levine, is a 77 y.o. female  DOB [birth year]  MRN [id number].  Admission date:  09/22/2022  Admitting Physician  Sherry Hale, MD  Discharge Date:  09/24/2022   Primary MD  Sherry Levine  Recommendations for primary care physician for things to follow:   1)Please follow-up with Urologist Dr. Ronne Levine in about --- 2 to 3 weeks for recheck and follow-up evaluation for urinary leakage/incontinence in his office----[facility], [address], [place] [address] Phone Number----[phone]  2)Repeat CBC and BMP blood test in about a week from now  3) follow-up with gastroenterologist if constipation issues persist---Please Follow up with Gastroenterologist Dr. Levon Levine-- address [address], [address], [place] [address],,MVHQI Number [phone]  4)Avoid ibuprofen/Advil/Aleve/Motrin/Goody Powders/Naproxen/BC powders/Meloxicam/Diclofenac/Indomethacin and other Nonsteroidal anti-inflammatory medications as these will make you more likely to bleed and can cause stomach ulcers, can also cause Kidney problems.   Admission Diagnosis  UTI (urinary tract infection) [N39.0]   Discharge Diagnosis  UTI (urinary tract infection) [N39.0]    Principal Problem:   UTI (urinary tract infection)      Past Medical History:  Diagnosis Date   Allergy    Anxiety    Clotting disorder (HCC)    Diverticulitis    DVT (deep venous thrombosis) (HCC)    Fibromyalgia    Gout    Osteoarthritis    Osteoarthritis (arthritis due to wear and tear of joints)    Plantar fasciitis    Pulmonary embolism (HCC)    Thyroid disease     Past Surgical History:  Procedure Laterality Date   ABDOMINAL HYSTERECTOMY     carpel tunnel     right hand   CERVICAL FUSION     CHOLECYSTECTOMY  2015   EYE SURGERY Bilateral    cataracts   KNEE ARTHROSCOPY WITH LATERAL MENISECTOMY     right knee     HPI   from the history and physical done on the day of admission:     Sherry Levine  is a 77 y.o. female with past medical history relevant for history of prior DVT/PE and hypercoagulable state who is chronically on Xarelto as well as history of anxiety disorder osteoarthritis and gout and hypothyroidism who presents to the ED with complaints of hematuria and dysuria since 09/19/2022. -Levine BM since 2 weeks ago the patient has chronic constipation with presumed IBS-C -Low-grade temperatures -in The ED temp is 100.5 -Additional history obtained from patient's friend Sherry Levine at bedside -Patient has had persistent emesis since 09/19/2022 emesis without bile or blood -She does have abdominal discomfort, Levine flank pain per se -In the ED COVID test is negative -Chest x-ray pending -CT abdomen and pelvis without acute findings CBC with a white count of 15.8 hemoglobin is 11.0 -Lipase is 40 -UA is not particularly convincing for UTI but patient has been taking Macrobid for the last couple days -Elevated lactic acid noted Sodium is 130, chloride is 97, T. bili 1.5 otherwise LFTs are not elevated     [facility]  Course:   Brief Narrative:  77 y.o. female with past medical history relevant for history of prior DVT/PE and hypercoagulable state who is chronically on Xarelto as well as history of anxiety disorder osteoarthritis and gout and hypothyroidism admitted on 09/22/2022 with concerns for sepsis secondary to urinary source   -Assessment and Plan: 1)Sepsis secondary to presumed urinary source---POA -   ?? "Failed" p.o. Macrobid prior to admission -WBC 15.8 >>13.2>>8.6 -CT abdomen and pelvis without acute findings -Treated with IV Rocephin and IV fluids  -Blood and urine cultures from 09/22/2022 negative -Levine further antibiotics indicated ??  Question if patient's UTI was partially treated because she took Macrobid prior to admission   2)Possible UTI---UA is not particularly convincing for UTI but patient has  been taking Macrobid for the last couple days -Management as above #1   3) intractable emesis--- suspect due to #1 #2 above -Levine further nausea and vomiting -Eating and drinking well   4)Hyponatremia/hypochloremia/hypokalemia/hypophosphatemia --- due to #3 above -Hypokalemia resolved, hypochloremia resolved, hyponatremia improved with hydration   5) chronic anticoagulation/hypercoagulable state--- continue Xarelto as ordered at 10 mg nightly for DVT prophylaxis   6) IBS-C with chronic constipation--- Levine BM in at least 2 weeks prior to admission -With laxatives patient had about 4 BMs this admission -CT abdomen and pelvis without acute changes -Continue laxatives as ordered -Outpatient follow-up with GI to discuss other treatment options including Linzess if symptoms persist despite laxatives   7) hypothyroidism--- continue levothyroxine  8) urinary incontinence--- oxybutynin as prescribed.... Levine hematuria -Outpatient follow-up with urologist for possible urodynamic studies advised   Disposition: The patient is from: Home              Anticipated d/c is to: Home  Discharge Condition: Stable  Follow UP   Follow-up Information     McKenzie, Mardene Celeste, MD. Schedule an appointment as soon as possible for a visit in 2 week(s).   Specialty: Urology Contact information: [address] [place] [address] [phone]         Sherry Levine, Sherry Boom, MD. Schedule an appointment as soon as possible for a visit in 1 month(s).   Specialty: Gastroenterology Contact information: [address] [place] [address] [phone]                  Diet and Activity recommendation:  As advised  Discharge Instructions   Discharge Instructions     Call MD for:  difficulty breathing, headache or visual disturbances   Complete by: As directed    Call MD for:  persistant dizziness or light-headedness   Complete by: As directed    Call MD for:   persistant nausea and vomiting   Complete by: As directed    Call MD for:  temperature >100.4   Complete by: As directed    Diet - low sodium heart healthy   Complete by: As directed    Discharge instructions   Complete by: As directed    1)Please follow-up with Urologist Dr. Ronne Levine in about --- 2 to 3 weeks for recheck and follow-up evaluation for urinary leakage/incontinence in his office----[facility], [address], [place] [address] Phone Number----[phone]  2)Repeat CBC and BMP blood test in about a week from now  3) follow-up with gastroenterologist if constipation issues persist---Please Follow up with Gastroenterologist Dr. Levon Levine-- address [address], [place] [address],,IRJJO Number [phone]  4)Avoid ibuprofen/Advil/Aleve/Motrin/Goody Powders/Naproxen/BC powders/Meloxicam/Diclofenac/Indomethacin and  other Nonsteroidal anti-inflammatory medications as these will make you more likely to bleed and can cause stomach ulcers, can also cause Kidney problems.   Increase activity slowly   Complete by: As directed        Discharge Medications     Allergies as of 09/24/2022       Reactions   Celebrex [celecoxib] Anaphylaxis, Hives, Itching   Colchicine Nausea Only   Lyrica [pregabalin] Other (See Comments)   Dizziness    Omnicef [cefdinir]    CANT REMEMBER   Penicillins Itching   Zithromax [azithromycin] Nausea And Vomiting   Codeine Itching, Rash   States that she tolerates vicodin        Medication List     STOP taking these medications    nitrofurantoin (macrocrystal-monohydrate) 100 MG capsule Commonly known as: MACROBID       TAKE these medications    acetaminophen 325 MG tablet Commonly known as: TYLENOL Take 2 tablets (650 mg total) by mouth every 6 (six) hours as needed for mild pain or fever (or Fever >/= 101). What changed:  medication strength how much to take reasons to take this    cetirizine 10 MG tablet Commonly known as: ZYRTEC TAKE 1 TABLET BY MOUTH ONCE DAILY   cyanocobalamin 1000 MCG/ML injection Commonly known as: VITAMIN B12 Inject 1,000 mcg into the muscle every 30 (thirty) days.   DULoxetine 60 MG capsule Commonly known as: CYMBALTA Take 1 capsule (60 mg total) by mouth daily.   gabapentin 300 MG capsule Commonly known as: NEURONTIN Take 1 capsule (300 mg total) by mouth 3 (three) times daily.   levothyroxine 50 MCG tablet Commonly known as: SYNTHROID Take 1 tablet (50 mcg total) by mouth daily before breakfast. What changed: See the new instructions.   LORazepam 1 MG tablet Commonly known as: ATIVAN TAKE 1 TABLET BY MOUTH AT BEDTIME IF NEEDED   ondansetron 4 MG disintegrating tablet Commonly known as: ZOFRAN-ODT Take 4 mg by mouth every 8 (eight) hours as needed for vomiting or nausea.   oxybutynin 5 MG tablet Commonly known as: DITROPAN Take 1 tablet (5 mg total) by mouth 2 (two) times daily.   pantoprazole 40 MG tablet Commonly known as: PROTONIX TAKE 1 TABLET BY MOUTH ONCE DAILY   polyethylene glycol 17 g packet Commonly known as: MIRALAX / GLYCOLAX Take 17 g by mouth daily.   Psyllium 48.57 % Powd Take 1 Scoop by mouth daily.   rivaroxaban 10 MG Tabs tablet Commonly known as: Xarelto Take 1 tablet (10 mg total) by mouth daily. What changed: how much to take   senna-docusate 8.6-50 MG tablet Commonly known as: Senokot-S Take 2 tablets by mouth at bedtime.   traZODone 50 MG tablet Commonly known as: DESYREL Take 1 tablet (50 mg total) by mouth at bedtime as needed for sleep.        Major procedures and Radiology Reports - PLEASE review detailed and final reports for all details, in brief -  DG Chest 2 View  Result Date: 09/22/2022 CLINICAL DATA:  Fever EXAM: CHEST - 2 VIEW COMPARISON:  02/14/2016 FINDINGS: Hardware in the cervicothoracic spine. Levine acute airspace disease, pleural effusion or pneumothorax. Moderate  large hiatal hernia. Levine pneumothorax. IMPRESSION: Levine active cardiopulmonary disease. Hiatal hernia. Electronically Signed   By: Jasmine Pang M.D.   On: 09/22/2022 19:37   CT ABDOMEN PELVIS WO CONTRAST  Result Date: 09/22/2022 CLINICAL DATA:  Acute, non localized abdominal pain. Urinary incontinence. EXAM: CT ABDOMEN AND  PELVIS WITHOUT CONTRAST TECHNIQUE: Multidetector CT imaging of the abdomen and pelvis was performed following the standard protocol without IV contrast. RADIATION DOSE REDUCTION: This exam was performed according to the departmental dose-optimization program which includes automated exposure control, adjustment of the mA and/or kV according to patient size and/or use of iterative reconstruction technique. COMPARISON:  08/18/2015 and 11/05/2015.  CTA chest dated 01/23/2012. FINDINGS: Lower chest: Large hiatal hernia is again demonstrated. Normal sized heart. Levine interval findings suspicious for malignancy in either breast. 5.6 x4.3 mm left lower lobe nodule on image number 14/4 with a mean diameter of just under 5 mm. Decreased atelectasis in both lower lobes with the exception of increased amount in the anterior left lower lobe on the 1st few images, not included in its entirety. Small amount of left posterior pleural thickening. Tiny right lower lobe calcified granuloma. Hepatobiliary: Levine focal liver abnormality is seen. Status post cholecystectomy. Levine biliary dilatation. Pancreas: Unremarkable. Levine pancreatic ductal dilatation or surrounding inflammatory changes. Spleen: Normal in size without focal abnormality. Adrenals/Urinary Tract: Stable calcified right renal artery aneurysm measuring 1.1 cm in maximum diameter. Additional dense arterial calcifications including the origins of both renal arteries. Unremarkable adrenal glands, kidneys, ureters and urinary bladder. Stomach/Bowel: Large hiatal hernia. Appendix appears normal. Levine evidence of bowel wall thickening, distention, or inflammatory  changes. 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 Levine enlarged lymph nodes. Reproductive: Status post hysterectomy. Levine adnexal masses. Other: Tiny umbilical hernia containing fat. Musculoskeletal: Right hip prosthesis. Lumbar and lower thoracic spine degenerative changes and mild scoliosis. IMPRESSION: 1. Levine acute abnormality. 2. Interval 5 mm left lower lobe nodule. Levine follow-up needed if patient is low-risk. Non-contrast chest CT can be considered in 12 months if patient is high-risk. This recommendation follows the consensus statement: Guidelines for Management of Incidental Pulmonary Nodules Detected on CT Images: From the Fleischner Society 2017; Radiology 2017; 284:228-243. 3. Large hiatal hernia. 4. 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 5. Tiny umbilical hernia containing fat. Electronically Signed   By: Beckie Salts M.D.   On: 09/22/2022 12:52    Micro Results  Recent Results (from the past 240 hour(s))  Urine Culture     Status: None   Collection Time: 09/22/22 12:37 PM   Specimen: Urine, Clean Catch  Result Value Ref Range Status   Specimen Description   Final    URINE, CLEAN CATCH Performed at [facility], [address], [place], [place] [address]    Special Requests   Final    NONE Performed at [facility], [address] [place], [place] [address]    Culture   Final    Levine GROWTH Performed at [facility], [address] [place], [place] [address]    Report Status 09/23/2022 FINAL  Final  Culture, blood (single)     Status: None (Preliminary result)   Collection Time: 09/22/22  2:56 PM   Specimen: BLOOD  Result Value Ref Range Status   Specimen Description BLOOD RIGHT ANTECUBITAL  Final   Special Requests   Final    BOTTLES DRAWN AEROBIC ONLY Blood Culture adequate volume   Culture   Final    Levine GROWTH 2  DAYS Performed at [facility], [address], [place], [place] [address]    Report Status PENDING  Incomplete  Culture, blood (Routine X 2) w Reflex to ID Panel  Status: None (Preliminary result)   Collection Time: 09/22/22  3:53 PM   Specimen: BLOOD  Result Value Ref Range Status   Specimen Description BLOOD BLOOD RIGHT HAND  Final   Special Requests   Final    BOTTLES DRAWN AEROBIC ONLY Blood Culture results may not be optimal due to an excessive volume of blood received in culture bottles   Culture   Final    Levine GROWTH 2 DAYS Performed at [facility], [address] [place], [place] [address]    Report Status PENDING  Incomplete  SARS Coronavirus 2 by RT PCR (hospital order, performed in [facility] hospital lab) *cepheid single result test* Anterior Nasal Swab     Status: None   Collection Time: 09/22/22  4:00 PM   Specimen: Anterior Nasal Swab  Result Value Ref Range Status   SARS Coronavirus 2 by RT PCR NEGATIVE NEGATIVE Final    Comment: (NOTE) SARS-CoV-2 target nucleic acids are NOT DETECTED.  The SARS-CoV-2 RNA is generally detectable in upper and lower respiratory specimens during the acute phase of infection. The lowest concentration of SARS-CoV-2 viral copies this assay can detect is 250 copies / mL. A negative result does not preclude SARS-CoV-2 infection and should not be used as the sole basis for treatment or other patient management decisions.  A negative result may occur with improper specimen collection / handling, submission of specimen other than nasopharyngeal swab, presence of viral mutation(s) within the areas targeted by this assay, and inadequate number of viral copies (<250 copies / mL). A negative result must be combined with clinical observations, patient history, and epidemiological information.  Fact Sheet for Patients:   RoadLapTop.co.za  Fact Sheet for Healthcare  Providers: http://kim-miller.com/  This test is not yet approved or  cleared by the [place] FDA and has been authorized for detection and/or diagnosis of SARS-CoV-2 by FDA under an Emergency Use Authorization (EUA).  This EUA will remain in effect (meaning this test can be used) for the duration of the COVID-19 declaration under Section 564(b)(1) of the Act, 21 U.S.C. section 360bbb-3(b)(1), unless the authorization is terminated or revoked sooner.  Performed at [facility], [address] [place], [place] [address]     Today   Subjective    Sherry Levine today has Levine new complaints, -Had soft BMs -Levine nausea or vomiting -Eating and drinking well -Levine dysuria or flank pain          Patient has been seen and examined prior to discharge   Objective   Blood pressure (!) 152/70, pulse 88, temperature 99.4 F (37.4 C), temperature source Oral, resp. rate 18, height 5\' 3"  (1.6 m), weight 68.9 kg, last menstrual period 04/05/1982, SpO2 100%.   Intake/Output Summary (Last 24 hours) at 09/24/2022 1120 Last data filed at 09/23/2022 1300 Gross per 24 hour  Intake 240 ml  Output --  Net 240 ml    Exam Gen:- Awake Alert, Levine acute distress  HEENT:- [place].AT, Levine sclera icterus Neck-Supple Neck,Levine JVD,.  Lungs-  CTAB , good air movement bilaterally CV- S1, S2 normal, regular Abd-  +ve B.Sounds, Abd Soft, Levine tenderness, Levine CVA area tenderness Extremity/Skin:- Levine  edema,   good pulses Psych-affect is appropriate, oriented x3 Neuro-Levine new focal deficits, Levine tremors    Data Review   CBC w Diff:  Lab Results  Component Value Date   WBC 8.6 09/24/2022   HGB 9.9 (L) 09/24/2022   HGB 13.5 05/30/2016   HCT 31.6 (L)  09/24/2022   HCT 41.6 05/30/2016   PLT 165 09/24/2022   PLT 255 05/30/2016   LYMPHOPCT 7 09/22/2022   MONOPCT 12 09/22/2022   EOSPCT 0 09/22/2022   BASOPCT 0 09/22/2022    CMP:  Lab Results  Component Value Date   NA 132 (L) 09/24/2022   NA  139 03/28/2016   K 3.9 09/24/2022   CL 100 09/24/2022   CO2 24 09/24/2022   BUN 8 09/24/2022   BUN 19 03/28/2016   CREATININE 0.76 09/24/2022   PROT 7.6 09/22/2022   PROT 6.6 07/09/2015   ALBUMIN 2.6 (L) 09/24/2022   ALBUMIN 3.6 07/09/2015   BILITOT 1.5 (H) 09/22/2022   BILITOT 0.2 07/09/2015   ALKPHOS 41 09/22/2022   AST 27 09/22/2022   ALT 16 09/22/2022  .  Total Discharge time is about 33 minutes  Sherry Levine M.D on 09/24/2022 at 11:20 AM  Go to [email or web address] -  for contact info  [facility] - Office  [phone]

## 2022-09-24 NOTE — Progress Notes (Signed)
Discharge instructions given to and reviewed with patient and her friend who helps with doctor appointments. Both patient and friend verbalized understanding of all discharge instructions including follow up care, new prescriptions and home medications. Patient left unit alert with NT and friend at side. Friend driving patient home.

## 2022-09-27 LAB — CULTURE, BLOOD (SINGLE)
Culture: NO GROWTH
Special Requests: ADEQUATE

## 2022-09-27 LAB — CULTURE, BLOOD (ROUTINE X 2): Culture: NO GROWTH

## 2022-10-05 DIAGNOSIS — Z9289 Personal history of other medical treatment: Secondary | ICD-10-CM | POA: Insufficient documentation

## 2022-10-05 DIAGNOSIS — E669 Obesity, unspecified: Secondary | ICD-10-CM | POA: Insufficient documentation

## 2022-10-05 DIAGNOSIS — D6852 Prothrombin gene mutation: Secondary | ICD-10-CM | POA: Insufficient documentation

## 2022-10-05 DIAGNOSIS — K5909 Other constipation: Secondary | ICD-10-CM | POA: Insufficient documentation

## 2022-10-05 NOTE — Progress Notes (Deleted)
Name: Sherry Levine DOB: 1946/01/10 MRN: [id number]  History of Present Illness: Sherry Levine is a 77 y.o. female who presents today as a new patient at [facility] [place]. All available relevant medical records have been reviewed.  - GU History: 1. Urinary incontinence. 2. Prior recurrent UTIs. 3. Prior episode of pyelonephritis in 2022.  Recent history: > 09/20/2022: Seen by PCP. Hematuria and dysuria since 09/19/2022. UA showed many WBC/hpf, 3-10 RBC/hpf, bacteria (many). Prescribed Macrobid for UTI.  > 09/22/2022: Admitted for possible urosepsis. - CT abdomen and pelvis without acute findings - UA upon admission: 6-10 WBC/hpf, 0-5 RBC/hpf, no bacteria. - Urine and blood cultures came back negative. Question if patient's UTI was partially treated because she took Macrobid prior to admission.  - She was prescribed Oxybutynin 5 mg 2x/day for incontinence.   Today: She {Actions; denies-reports:120008} increased urinary urgency, frequency, nocturia, dysuria, gross hematuria, hesitancy, straining to void, or sensations of incomplete emptying.  She {Actions; denies-reports:120008} urge incontinence. She {Actions; denies-reports:120008} stress incontinence with ***cough/***laugh/***sneeze/***heavy lifting /***exercise. She reports the ***SUI / ***UUI is predominant.  She leaks *** times per ***. Wears *** ***pads/ ***diapers per day. She reports urinary incontinence {ACTION; IS/IS [id number]} significantly bothersome.   She {Actions; denies-reports:120008} flank pain. She {Actions; denies-reports:120008} abdominal pain. She {Actions; denies-reports:120008} fevers. She {Actions; denies-reports:120008} nausea/ vomiting.   Fall Screening: Do you usually have a device to assist in your mobility? {yes/no:20286} ***cane / ***walker / ***wheelchair  Medications: Current Outpatient Medications  Medication Sig Dispense Refill   acetaminophen (TYLENOL) 325 MG tablet Take 2 tablets  (650 mg total) by mouth every 6 (six) hours as needed for mild pain or fever (or Fever >/= 101).     cetirizine (ZYRTEC) 10 MG tablet TAKE 1 TABLET BY MOUTH ONCE DAILY 30 tablet 2   cyanocobalamin (VITAMIN B12) 1000 MCG/ML injection Inject 1,000 mcg into the muscle every 30 (thirty) days.     DULoxetine (CYMBALTA) 60 MG capsule Take 1 capsule (60 mg total) by mouth daily. 30 capsule 5   gabapentin (NEURONTIN) 300 MG capsule Take 1 capsule (300 mg total) by mouth 3 (three) times daily. 90 capsule 3   levothyroxine (SYNTHROID) 50 MCG tablet Take 1 tablet (50 mcg total) by mouth daily before breakfast. 90 tablet 2   LORazepam (ATIVAN) 1 MG tablet TAKE 1 TABLET BY MOUTH AT BEDTIME IF NEEDED 30 tablet 0   ondansetron (ZOFRAN-ODT) 4 MG disintegrating tablet Take 4 mg by mouth every 8 (eight) hours as needed for vomiting or nausea.     oxybutynin (DITROPAN) 5 MG tablet Take 1 tablet (5 mg total) by mouth 2 (two) times daily. 60 tablet 2   pantoprazole (PROTONIX) 40 MG tablet TAKE 1 TABLET BY MOUTH ONCE DAILY 30 tablet 5   polyethylene glycol (MIRALAX / GLYCOLAX) 17 g packet Take 17 g by mouth daily. 90 each 2   Psyllium 48.57 % POWD Take 1 Scoop by mouth daily. 539 g 2   rivaroxaban (XARELTO) 10 MG TABS tablet Take 1 tablet (10 mg total) by mouth daily. 90 tablet 2   senna-docusate (SENOKOT-S) 8.6-50 MG tablet Take 2 tablets by mouth at bedtime. 180 tablet 3   traZODone (DESYREL) 50 MG tablet Take 1 tablet (50 mg total) by mouth at bedtime as needed for sleep. 30 tablet 3   No current facility-administered medications for this visit.    Allergies: Allergies  Allergen Reactions   Celebrex [Celecoxib] Anaphylaxis, Hives and Itching   Colchicine  Nausea Only   Lyrica [Pregabalin] Other (See Comments)    Dizziness    Omnicef [Cefdinir]     CANT REMEMBER   Penicillins Itching   Zithromax [Azithromycin] Nausea And Vomiting   Codeine Itching and Rash    States that she tolerates vicodin    Past  Medical History:  Diagnosis Date   Allergy    Anxiety    Clotting disorder (HCC)    Diverticulitis    DVT (deep venous thrombosis) (HCC)    Fibromyalgia    Gout    Osteoarthritis    Osteoarthritis (arthritis due to wear and tear of joints)    Plantar fasciitis    Pulmonary embolism (HCC)    Thyroid disease    Past Surgical History:  Procedure Laterality Date   ABDOMINAL HYSTERECTOMY     carpel tunnel     right hand   CERVICAL FUSION     CHOLECYSTECTOMY  2015   EYE SURGERY Bilateral    cataracts   KNEE ARTHROSCOPY WITH LATERAL MENISECTOMY     right knee   Family History  Problem Relation Age of Onset   Stroke Mother    Heart disease Mother    Stroke Father    Heart disease Father    Social History   Socioeconomic History   Marital status: Married    Spouse name: Not on file   Number of children: Not on file   Years of education: Not on file   Highest education level: Not on file  Occupational History   Not on file  Tobacco Use   Smoking status: Never   Smokeless tobacco: Never  Substance and Sexual Activity   Alcohol use: No   Drug use: No   Sexual activity: Not on file  Other Topics Concern   Not on file  Social History Narrative   Not on file   Social Determinants of Health   Financial Resource Strain: Low Risk  (11/09/2021)   [facility]   Overall Financial Resource Strain (CARDIA)    Difficulty of Paying Living Expenses: Not hard at all  Food Insecurity: No Food Insecurity (09/22/2022)   Hunger Vital Sign    Worried About Running Out of Food in the Last Year: Never true    Ran Out of Food in the Last Year: Never true  Transportation Needs: No Transportation Needs (09/22/2022)   PRAPARE - Administrator, Civil Service (Medical): No    Lack of Transportation (Non-Medical): No  Physical Activity: Inactive (11/09/2021)   [facility]   Exercise Vital Sign    Days of Exercise per  Week: 0 days    Minutes of Exercise per Session: 0 min  Stress: No Stress Concern Present (12/22/2020)   [facility]   [employer] of Occupational Health - Occupational Stress Questionnaire    Feeling of Stress : Only a little  Social Connections: Unknown (11/09/2021)   [facility]   Social Connection and Isolation Panel [NHANES]    Frequency of Communication with Friends and Family: More than three times a week    Frequency of Social Gatherings with Friends and Family: More than three times a week    Attends Religious Services: More than 4 times per year    Active Member of [employer] or Organizations: Yes    Attends Banker Meetings: More than 4 times per year    Marital  Status: Patient declined  Intimate Partner Violence: Not At Risk (09/22/2022)   Humiliation, Afraid, Rape, and Kick questionnaire    Fear of Current or Ex-Partner: No    Emotionally Abused: No    Physically Abused: No    Sexually Abused: No    SUBJECTIVE  Review of Systems Constitutional: Patient ***denies any unintentional weight loss or change in strength lntegumentary: Patient ***denies any rashes or pruritus Eyes: Patient denies ***dry eyes ENT: Patient ***denies dry mouth Cardiovascular: Patient ***denies chest pain or syncope Respiratory: Patient ***denies shortness of breath Gastrointestinal: Patient ***denies nausea, vomiting, constipation, or diarrhea Musculoskeletal: Patient ***denies muscle cramps or weakness Neurologic: Patient ***denies convulsions or seizures Psychiatric: Patient ***denies memory problems Allergic/Immunologic: Patient ***denies recent allergic reaction(s) Hematologic/Lymphatic: Patient denies bleeding tendencies Endocrine: Patient ***denies heat/cold intolerance  GU: As per HPI.  OBJECTIVE There were no vitals filed for this visit. There is no height or weight on file to calculate BMI.  Physical  Examination  Constitutional: ***No obvious distress; patient is ***non-toxic appearing  Cardiovascular: ***No visible lower extremity edema.  Respiratory: The patient does ***not have audible wheezing/stridor; respirations do ***not appear labored  Gastrointestinal: Abdomen ***non-distended Musculoskeletal: ***Normal ROM of UEs  Skin: ***No obvious rashes/open sores  Neurologic: CN 2-12 grossly ***intact Psychiatric: Answered questions ***appropriately with ***normal affect  Hematologic/Lymphatic/Immunologic: ***No obvious bruises or sites of spontaneous bleeding  UA: ***negative / *** WBC/hpf, *** RBC/hpf, bacteria (***) PVR: *** ml  ASSESSMENT No diagnosis found. ***  Will plan for follow up in *** months or sooner if needed. Pt verbalized understanding and agreement. All questions were answered.  PLAN Advised the following: *** ***No follow-ups on file.  No orders of the defined types were placed in this encounter.   It has been explained that the patient is to follow regularly with their PCP in addition to all other providers involved in their care and to follow instructions provided by these respective offices. Patient advised to contact urology clinic if any urologic-pertaining questions, concerns, new symptoms or problems arise in the interim period.  There are no Patient Instructions on file for this visit.  Electronically signed by:  Donnita Falls, MSN, FNP-C, CUNP 10/05/2022 6:19 PM

## 2022-10-11 ENCOUNTER — Ambulatory Visit: Payer: Medicare PPO | Admitting: Urology

## 2022-10-11 DIAGNOSIS — Z09 Encounter for follow-up examination after completed treatment for conditions other than malignant neoplasm: Secondary | ICD-10-CM

## 2022-10-11 DIAGNOSIS — N3001 Acute cystitis with hematuria: Secondary | ICD-10-CM

## 2022-10-11 DIAGNOSIS — R32 Unspecified urinary incontinence: Secondary | ICD-10-CM

## 2022-10-20 ENCOUNTER — Encounter: Payer: Self-pay | Admitting: Family Medicine

## 2022-10-20 ENCOUNTER — Ambulatory Visit: Payer: Medicare PPO | Admitting: Family Medicine

## 2022-10-20 VITALS — BP 120/81 | HR 91 | Temp 96.8°F | Ht 63.0 in | Wt 152.6 lb

## 2022-10-20 DIAGNOSIS — E559 Vitamin D deficiency, unspecified: Secondary | ICD-10-CM

## 2022-10-20 DIAGNOSIS — D6852 Prothrombin gene mutation: Secondary | ICD-10-CM

## 2022-10-20 DIAGNOSIS — Z6827 Body mass index (BMI) 27.0-27.9, adult: Secondary | ICD-10-CM

## 2022-10-20 DIAGNOSIS — D6859 Other primary thrombophilia: Secondary | ICD-10-CM | POA: Diagnosis not present

## 2022-10-20 DIAGNOSIS — E039 Hypothyroidism, unspecified: Secondary | ICD-10-CM

## 2022-10-20 DIAGNOSIS — M797 Fibromyalgia: Secondary | ICD-10-CM

## 2022-10-20 DIAGNOSIS — G500 Trigeminal neuralgia: Secondary | ICD-10-CM

## 2022-10-20 NOTE — Progress Notes (Signed)
Subjective:  Patient ID: Sherry Levine, female    DOB: Jun 11, 1945, 77 y.o.   MRN: [id number]  Patient Care Team: Sonny Masters, FNP as PCP - General (Family Medicine)   Chief Complaint:  New Patient (Initial Visit) [facility]) and Establish Care   HPI: Sherry Levine is a 77 y.o. female presenting on 10/20/2022 for New Patient (Initial Visit) [facility]) and Establish Care   Patient presents today to establish care with new PCP.  She was followed by Dr. Dot Lanes in [place], [place].  She recently moved back to the area.  She has a history of acquired hypothyroidism and is on Synthroid.  She reports this has been well-controlled.  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  She has vitamin D deficiency and is on repletion therapy.  She denies any recent fractures, trouble walking, or arthralgias.  She has fibromyalgia which she reports is well-controlled with Cymbalta.  She reports having some recent dental surgery which caused some facial pain, she was told to follow-up with neurologist pertaining to trigeminal neuralgia.  She has not established and would like a referral today.  She does have an overactive bladder which is well-controlled by Ditropan.  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  She has a large hiatal hernia and is on PPI therapy daily.  She did have an unexplained anemia and had to have blood transfusions at the cancer center in [place].  Last transfusion was over a year ago.  She had an extensive workup with no definitive cause of anemia.  She has had a right hip placement surgery due to arthritis and has done well since this time.  She did not have any significant complaints or concerns today.    Relevant past medical, surgical, family, and social history reviewed and updated as indicated.  Allergies and medications reviewed and  updated. Data reviewed: Chart in Epic.   Past Medical History:  Diagnosis Date   Allergy    Anxiety    Clotting disorder (HCC)    Diverticulitis    DVT (deep venous thrombosis) (HCC)    Fibromyalgia    Gout    Osteoarthritis    Osteoarthritis (arthritis due to wear and tear of joints)    Pemphigus    Plantar fasciitis    Pulmonary embolism (HCC)    Thyroid disease     Past Surgical History:  Procedure Laterality Date   ABDOMINAL HYSTERECTOMY     carpel tunnel     right hand   CERVICAL FUSION     CHOLECYSTECTOMY  2015   EYE SURGERY Bilateral    cataracts   KNEE ARTHROSCOPY WITH LATERAL MENISECTOMY     right knee    Social History   Socioeconomic History   Marital status: Married    Spouse name: Not on file   Number of children: Not on file   Years of education: Not on file   Highest education level: Not on file  Occupational History   Not on file  Tobacco Use   Smoking status: Never   Smokeless tobacco: Never  Vaping Use   Vaping status: Never Used  Substance and Sexual Activity   Alcohol use: No   Drug use: No   Sexual activity: Not on file  Other Topics Concern   Not on file  Social History Narrative   Not on file  Social Determinants of Health   Financial Resource Strain: Low Risk  (11/09/2021)   Received from [facility]   Overall Financial Resource Strain (CARDIA)    Difficulty of Paying Living Expenses: Not hard at all  Food Insecurity: No Food Insecurity (09/22/2022)   Hunger Vital Sign    Worried About Running Out of Food in the Last Year: Never true    Ran Out of Food in the Last Year: Never true  Transportation Needs: No Transportation Needs (09/22/2022)   PRAPARE - Administrator, Civil Service (Medical): No    Lack of Transportation (Non-Medical): No  Physical Activity: Inactive (11/09/2021)   [facility]   Exercise Vital Sign    Days of Exercise per Week: 0 days    Minutes of  Exercise per Session: 0 min  Stress: No Stress Concern Present (12/22/2020)   [facility]   [employer] of Occupational Health - Occupational Stress Questionnaire    Feeling of Stress : Only a little  Social Connections: Unknown (11/09/2021)   [facility]   Social Connection and Isolation Panel [NHANES]    Frequency of Communication with Friends and Family: More than three times a week    Frequency of Social Gatherings with Friends and Family: More than three times a week    Attends Religious Services: More than 4 times per year    Active Member of [employer] or Organizations: Yes    Attends Banker Meetings: More than 4 times per year    Marital Status: Patient declined  Intimate Partner Violence: Not At Risk (09/22/2022)   Humiliation, Afraid, Rape, and Kick questionnaire    Fear of Current or Ex-Partner: No    Emotionally Abused: No    Physically Abused: No    Sexually Abused: No    Outpatient Encounter Medications as of 10/20/2022  Medication Sig   acetaminophen (TYLENOL) 325 MG tablet Take 2 tablets (650 mg total) by mouth every 6 (six) hours as needed for mild pain or fever (or Fever >/= 101).   cetirizine (ZYRTEC) 10 MG tablet TAKE 1 TABLET BY MOUTH ONCE DAILY   cyanocobalamin (VITAMIN B12) 1000 MCG/ML injection Inject 1,000 mcg into the muscle every 30 (thirty) days.   DULoxetine (CYMBALTA) 60 MG capsule Take 1 capsule (60 mg total) by mouth daily.   levothyroxine (SYNTHROID) 50 MCG tablet Take 1 tablet (50 mcg total) by mouth daily before breakfast.   ondansetron (ZOFRAN-ODT) 4 MG disintegrating tablet Take 4 mg by mouth every 8 (eight) hours as needed for vomiting or nausea.   pantoprazole (PROTONIX) 40 MG tablet TAKE 1 TABLET BY MOUTH ONCE DAILY   polyethylene glycol (MIRALAX / GLYCOLAX) 17 g packet Take 17 g by mouth daily.   rivaroxaban (XARELTO) 10 MG TABS tablet Take 1 tablet (10 mg total) by mouth  daily.   senna-docusate (SENOKOT-S) 8.6-50 MG tablet Take 2 tablets by mouth at bedtime.   [DISCONTINUED] LORazepam (ATIVAN) 1 MG tablet TAKE 1 TABLET BY MOUTH AT BEDTIME IF NEEDED (Patient taking differently: Take 0.5 mg by mouth at bedtime as needed. 0.25 prn at bedtime as needed)   [DISCONTINUED] gabapentin (NEURONTIN) 300 MG capsule Take 1 capsule (300 mg total) by mouth 3 (three) times daily.   [DISCONTINUED] oxybutynin (DITROPAN) 5 MG tablet Take 1 tablet (5 mg total) by mouth 2 (two) times daily.   [DISCONTINUED] Psyllium 48.57 % POWD Take 1  Scoop by mouth daily.   [DISCONTINUED] traZODone (DESYREL) 50 MG tablet Take 1 tablet (50 mg total) by mouth at bedtime as needed for sleep.   No facility-administered encounter medications on file as of 10/20/2022.    Allergies  Allergen Reactions   Celebrex [Celecoxib] Anaphylaxis, Hives and Itching   Pollen Extract Other (See Comments)    Stuffy nose  Stuffy nose  Stuffy nose   Trazodone Other (See Comments)    Other reaction(s): vertigo   Colchicine Nausea Only   Hydrocodone-Acetaminophen     Other Reaction(s): feel mentally off   Lyrica [Pregabalin] Other (See Comments)    Dizziness    Omnicef [Cefdinir]     CANT REMEMBER   Penicillins Itching   Zithromax [Azithromycin] Nausea And Vomiting   Codeine Itching and Rash    States that she tolerates vicodin   Oxycodone     Other Reaction(s): feel mentally off  Dry heaves   Prednisone Anxiety and Other (See Comments)    C/O Confusion  C/O Confusion    Review of Systems  Constitutional:  Negative for activity change, appetite change, chills, diaphoresis, fatigue, fever and unexpected weight change.  HENT: Negative.    Eyes: Negative.  Negative for photophobia and visual disturbance.  Respiratory:  Negative for cough, chest tightness and shortness of breath.   Cardiovascular:  Negative for chest pain, palpitations and leg swelling.  Gastrointestinal:  Negative for abdominal pain,  blood in stool, constipation, diarrhea, nausea and vomiting.  Endocrine: Negative.  Negative for polydipsia, polyphagia and polyuria.  Genitourinary:  Negative for dysuria, frequency and urgency.  Musculoskeletal:  Positive for arthralgias and myalgias.  Skin: Negative.   Allergic/Immunologic: Negative.   Neurological:  Negative for dizziness, tremors, seizures, syncope, facial asymmetry, speech difficulty, weakness, light-headedness, numbness and headaches.  Hematological: Negative.   Psychiatric/Behavioral:  Negative for confusion, hallucinations, sleep disturbance and suicidal ideas.   All other systems reviewed and are negative.       Objective:  BP 120/81   Pulse 91   Temp (!) 96.8 F (36 C) (Temporal)   Ht 5\' 3"  (1.6 m)   Wt 152 lb 9.6 oz (69.2 kg)   LMP 04/05/1982   SpO2 100%   BMI 27.03 kg/m    Wt Readings from Last 3 Encounters:  10/20/22 152 lb 9.6 oz (69.2 kg)  09/22/22 152 lb (68.9 kg)  08/10/22 150 lb (68 kg)    Physical Exam Vitals and nursing note reviewed.  Constitutional:      General: She is not in acute distress.    Appearance: Normal appearance. She is well-developed, well-groomed and overweight. She is not ill-appearing, toxic-appearing or diaphoretic.  HENT:     Head: Normocephalic and atraumatic.     Jaw: There is normal jaw occlusion.     Right Ear: Hearing normal.     Left Ear: Hearing normal.     Nose: Nose normal.     Mouth/Throat:     Lips: Pink.     Mouth: Mucous membranes are moist.     Pharynx: Oropharynx is clear. Uvula midline.  Eyes:     General: Lids are normal.     Extraocular Movements: Extraocular movements intact.     Conjunctiva/sclera: Conjunctivae normal.     Pupils: Pupils are equal, round, and reactive to light.  Neck:     Thyroid: No thyroid mass, thyromegaly or thyroid tenderness.     Vascular: No carotid bruit or JVD.     Trachea: Trachea and  phonation normal.  Cardiovascular:     Rate and Rhythm: Normal rate and  regular rhythm.     Chest Wall: PMI is not displaced.     Pulses: Normal pulses.     Heart sounds: Normal heart sounds. No murmur heard.    No friction rub. No gallop.  Pulmonary:     Effort: Pulmonary effort is normal. No respiratory distress.     Breath sounds: Normal breath sounds. No wheezing.  Abdominal:     General: Bowel sounds are normal. There is no distension or abdominal bruit.     Palpations: Abdomen is soft. There is no hepatomegaly or splenomegaly.     Tenderness: There is no abdominal tenderness. There is no right CVA tenderness or left CVA tenderness.     Hernia: No hernia is present.  Musculoskeletal:        General: Normal range of motion.     Cervical back: Normal range of motion and neck supple.     Right lower leg: No edema.     Left lower leg: No edema.  Lymphadenopathy:     Cervical: No cervical adenopathy.  Skin:    General: Skin is warm and dry.     Capillary Refill: Capillary refill takes less than 2 seconds.     Coloration: Skin is not cyanotic, jaundiced or pale.     Findings: No rash.  Neurological:     General: No focal deficit present.     Mental Status: She is alert and oriented to person, place, and time.     Sensory: Sensation is intact.     Motor: Motor function is intact.     Coordination: Coordination is intact.     Gait: Gait is intact.     Deep Tendon Reflexes: Reflexes are normal and symmetric.  Psychiatric:        Attention and Perception: Attention and perception normal.        Mood and Affect: Mood and affect normal.        Speech: Speech normal.        Behavior: Behavior normal. Behavior is cooperative.        Thought Content: Thought content normal.        Cognition and Memory: Cognition and memory normal.        Judgment: Judgment normal.     Results for orders placed or performed during the hospital encounter of 09/22/22  Urine Culture   Specimen: Urine, Clean Catch  Result Value Ref Range   Specimen Description       URINE, CLEAN CATCH Performed at [facility], [address] [place], [place] [address]    Special Requests      NONE Performed at [facility], [address], [place], [place] [address]    Culture      NO GROWTH Performed at [facility], [address], [place], [place] [address]    Report Status 09/23/2022 FINAL   Culture, blood (single)   Specimen: BLOOD  Result Value Ref Range   Specimen Description BLOOD RIGHT ANTECUBITAL    Special Requests      BOTTLES DRAWN AEROBIC ONLY Blood Culture adequate volume   Culture      NO GROWTH 5 DAYS Performed at [facility], [address] [place], [place] [address]    Report Status 09/27/2022 FINAL   Culture, blood (Routine X 2) w Reflex to ID Panel   Specimen: BLOOD  Result Value Ref Range   Specimen Description  BLOOD BLOOD RIGHT HAND    Special Requests      BOTTLES DRAWN AEROBIC ONLY Blood Culture results may not be optimal due to an excessive volume of blood received in culture bottles   Culture      NO GROWTH 5 DAYS Performed at [facility], [address] [place], [place] [address]    Report Status 09/27/2022 FINAL   SARS Coronavirus 2 by RT PCR (hospital order, performed in [facility] hospital lab) *cepheid single result test* Anterior Nasal Swab   Specimen: Anterior Nasal Swab  Result Value Ref Range   SARS Coronavirus 2 by RT PCR NEGATIVE NEGATIVE  Urinalysis, Routine w reflex microscopic -Urine, Clean Catch  Result Value Ref Range   Color, Urine YELLOW YELLOW   APPearance CLEAR CLEAR   Specific Gravity, Urine 1.003 (L) 1.005 - 1.030   pH 6.0 5.0 - 8.0   Glucose, UA NEGATIVE NEGATIVE mg/dL   Hgb urine dipstick MODERATE (A) NEGATIVE   Bilirubin Urine NEGATIVE NEGATIVE   Ketones, ur 20 (A) NEGATIVE mg/dL   Protein, ur NEGATIVE NEGATIVE mg/dL   Nitrite NEGATIVE NEGATIVE   Leukocytes,Ua TRACE (A) NEGATIVE   RBC / HPF 0-5 0 - 5 RBC/hpf   WBC, UA 6-10 0 - 5 WBC/hpf   Bacteria, UA NONE SEEN NONE SEEN   Squamous  Epithelial / HPF 0-5 0 - 5 /HPF  Comprehensive metabolic panel  Result Value Ref Range   Sodium 130 (L) 135 - 145 mmol/L   Potassium 4.3 3.5 - 5.1 mmol/L   Chloride 97 (L) 98 - 111 mmol/L   CO2 22 22 - 32 mmol/L   Glucose, Bld 104 (H) 70 - 99 mg/dL   BUN 17 8 - 23 mg/dL   Creatinine, Ser 6.96 0.44 - 1.00 mg/dL   Calcium 9.5 8.9 - 29.5 mg/dL   Total Protein 7.6 6.5 - 8.1 g/dL   Albumin 3.4 (L) 3.5 - 5.0 g/dL   AST 27 15 - 41 U/L   ALT 16 0 - 44 U/L   Alkaline Phosphatase 41 38 - 126 U/L   Total Bilirubin 1.5 (H) 0.3 - 1.2 mg/dL   GFR, Estimated >28 >41 mL/min   Anion gap 11 5 - 15  Lipase, blood  Result Value Ref Range   Lipase 40 11 - 51 U/L  CBC with Differential/Platelet  Result Value Ref Range   WBC 15.8 (H) 4.0 - 10.5 K/uL   RBC 3.56 (L) 3.87 - 5.11 MIL/uL   Hemoglobin 11.0 (L) 12.0 - 15.0 g/dL   HCT 32.4 (L) 40.1 - 02.7 %   MCV 97.8 80.0 - 100.0 fL   MCH 30.9 26.0 - 34.0 pg   MCHC 31.6 30.0 - 36.0 g/dL   RDW 25.3 66.4 - 40.3 %   Platelets 173 150 - 400 K/uL   nRBC 0.0 0.0 - 0.2 %   Neutrophils Relative % 81 %   Neutro Abs 12.7 (H) 1.7 - 7.7 K/uL   Lymphocytes Relative 7 %   Lymphs Abs 1.2 0.7 - 4.0 K/uL   Monocytes Relative 12 %   Monocytes Absolute 1.8 (H) 0.1 - 1.0 K/uL   Eosinophils Relative 0 %   Eosinophils Absolute 0.0 0.0 - 0.5 K/uL   Basophils Relative 0 %   Basophils Absolute 0.0 0.0 - 0.1 K/uL   Immature Granulocytes 0 %   Abs Immature Granulocytes 0.07 0.00 - 0.07 K/uL  Lactic acid, plasma  Result Value Ref Range   Lactic Acid,  Venous 2.3 (HH) 0.5 - 1.9 mmol/L  Lactic acid, plasma  Result Value Ref Range   Lactic Acid, Venous 2.6 (HH) 0.5 - 1.9 mmol/L  CBC  Result Value Ref Range   WBC 13.2 (H) 4.0 - 10.5 K/uL   RBC 3.34 (L) 3.87 - 5.11 MIL/uL   Hemoglobin 10.2 (L) 12.0 - 15.0 g/dL   HCT 16.1 (L) 09.6 - 04.5 %   MCV 99.4 80.0 - 100.0 fL   MCH 30.5 26.0 - 34.0 pg   MCHC 30.7 30.0 - 36.0 g/dL   RDW 40.9 81.1 - 91.4 %   Platelets 179 150 -  400 K/uL   nRBC 0.0 0.0 - 0.2 %  Renal function panel  Result Value Ref Range   Sodium 133 (L) 135 - 145 mmol/L   Potassium 3.2 (L) 3.5 - 5.1 mmol/L   Chloride 101 98 - 111 mmol/L   CO2 23 22 - 32 mmol/L   Glucose, Bld 97 70 - 99 mg/dL   BUN 10 8 - 23 mg/dL   Creatinine, Ser 7.82 0.44 - 1.00 mg/dL   Calcium 9.1 8.9 - 95.6 mg/dL   Phosphorus 2.4 (L) 2.5 - 4.6 mg/dL   Albumin 2.7 (L) 3.5 - 5.0 g/dL   GFR, Estimated >21 >30 mL/min   Anion gap 9 5 - 15  Lactic acid, plasma  Result Value Ref Range   Lactic Acid, Venous 0.9 0.5 - 1.9 mmol/L  CBC  Result Value Ref Range   WBC 8.6 4.0 - 10.5 K/uL   RBC 3.21 (L) 3.87 - 5.11 MIL/uL   Hemoglobin 9.9 (L) 12.0 - 15.0 g/dL   HCT 86.5 (L) 78.4 - 69.6 %   MCV 98.4 80.0 - 100.0 fL   MCH 30.8 26.0 - 34.0 pg   MCHC 31.3 30.0 - 36.0 g/dL   RDW 29.5 28.4 - 13.2 %   Platelets 165 150 - 400 K/uL   nRBC 0.0 0.0 - 0.2 %  Renal function panel  Result Value Ref Range   Sodium 132 (L) 135 - 145 mmol/L   Potassium 3.9 3.5 - 5.1 mmol/L   Chloride 100 98 - 111 mmol/L   CO2 24 22 - 32 mmol/L   Glucose, Bld 98 70 - 99 mg/dL   BUN 8 8 - 23 mg/dL   Creatinine, Ser 4.40 0.44 - 1.00 mg/dL   Calcium 8.7 (L) 8.9 - 10.3 mg/dL   Phosphorus 2.6 2.5 - 4.6 mg/dL   Albumin 2.6 (L) 3.5 - 5.0 g/dL   GFR, Estimated >10 >27 mL/min   Anion gap 8 5 - 15       Pertinent labs & imaging results that were available during my care of the patient were reviewed by me and considered in my medical decision making.  Assessment & Plan:  Sherry Levine was seen today for new patient (initial visit) and establish care.  Diagnoses and all orders for this visit:  Acquired hypothyroidism Will check labs today and adjust replacement therapy if warranted. -     Thyroid Panel With TSH  Vitamin D deficiency Will check labs today and adjust repletion therapy if warranted. 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  If patient remains anemic, will refer to hematology here. -     Anemia Profile B  Fibromyalgia Doing well on Cymbalta, will continue.  BMI 27.0-27.9,adult Diet and exercise encouraged.  Labs pending. -     Anemia Profile B -     CMP14+EGFR -     Lipid panel -     Thyroid Panel With TSH -     VITAMIN D 25 Hydroxy (Vit-D Deficiency, Fractures)  Trigeminal neuralgia Will place referral to neurology. -     Ambulatory referral to Neurology  Electronic health record, including labs and imaging reports, reviewed in detail.  Continue all other maintenance medications.  Follow up plan: Return in about 6 months (around 04/20/2023), or if symptoms worsen or fail to improve, for CPE.   Continue healthy lifestyle choices, including diet (rich in fruits, vegetables, and lean proteins, and low in salt and simple carbohydrates) and exercise (at least 30 minutes of moderate physical activity daily).   The above assessment and management plan was discussed with the patient. The patient verbalized understanding of and has agreed to the management plan. Patient is aware to call the clinic if they develop any new symptoms or if symptoms persist or worsen. Patient is aware when to return to the clinic for a follow-up visit. Patient educated on when it is appropriate to go to the emergency department.   Kari Baars, FNP-C [facility] [phone]

## 2022-10-21 LAB — ANEMIA PROFILE B
Basophils Absolute: 0 10*3/uL (ref 0.0–0.2)
Basos: 0 %
EOS (ABSOLUTE): 0.2 10*3/uL (ref 0.0–0.4)
Eos: 3 %
Ferritin: 23 ng/mL (ref 15–150)
Folate: 8.7 ng/mL (ref >3.0)
Hematocrit: 35.5 % (ref 34.0–46.6)
Hemoglobin: 11.1 g/dL (ref 11.1–15.9)
Immature Grans (Abs): 0 10*3/uL (ref 0.0–0.1)
Immature Granulocytes: 0 %
Iron Saturation: 10 % — ABNORMAL LOW (ref 15–55)
Iron: 35 ug/dL (ref 27–139)
Lymphocytes Absolute: 1.2 10*3/uL (ref 0.7–3.1)
Lymphs: 21 %
MCH: 30 pg (ref 26.6–33.0)
MCHC: 31.3 g/dL — ABNORMAL LOW (ref 31.5–35.7)
MCV: 96 fL (ref 79–97)
Monocytes Absolute: 0.7 10*3/uL (ref 0.1–0.9)
Monocytes: 11 %
Neutrophils Absolute: 3.9 10*3/uL (ref 1.4–7.0)
Neutrophils: 65 %
Platelets: 176 10*3/uL (ref 150–450)
RBC: 3.7 x10E6/uL — ABNORMAL LOW (ref 3.77–5.28)
RDW: 12.9 % (ref 11.7–15.4)
Retic Ct Pct: 1.1 % (ref 0.6–2.6)
Total Iron Binding Capacity: 350 ug/dL (ref 250–450)
UIBC: 315 ug/dL (ref 118–369)
Vitamin B-12: 664 pg/mL (ref 232–1245)
WBC: 6 10*3/uL (ref 3.4–10.8)

## 2022-10-21 LAB — CMP14+EGFR
ALT: 14 IU/L (ref 0–32)
AST: 21 IU/L (ref 0–40)
Albumin: 3.8 g/dL (ref 3.8–4.8)
Alkaline Phosphatase: 53 IU/L (ref 44–121)
BUN/Creatinine Ratio: 13 (ref 12–28)
BUN: 12 mg/dL (ref 8–27)
Bilirubin Total: 0.4 mg/dL (ref 0.0–1.2)
CO2: 22 mmol/L (ref 20–29)
Calcium: 10.3 mg/dL (ref 8.7–10.3)
Chloride: 102 mmol/L (ref 96–106)
Creatinine, Ser: 0.92 mg/dL (ref 0.57–1.00)
Globulin, Total: 3.4 g/dL (ref 1.5–4.5)
Glucose: 87 mg/dL (ref 70–99)
Potassium: 4.5 mmol/L (ref 3.5–5.2)
Sodium: 138 mmol/L (ref 134–144)
Total Protein: 7.2 g/dL (ref 6.0–8.5)
eGFR: 64 mL/min/{1.73_m2} (ref >59)

## 2022-10-21 LAB — VITAMIN D 25 HYDROXY (VIT D DEFICIENCY, FRACTURES): Vit D, 25-Hydroxy: 33.2 ng/mL (ref 30.0–100.0)

## 2022-10-21 LAB — THYROID PANEL WITH TSH
Free Thyroxine Index: 3.3 (ref 1.2–4.9)
T3 Uptake Ratio: 31 % (ref 24–39)
T4, Total: 10.6 ug/dL (ref 4.5–12.0)
TSH: 1.36 u[IU]/mL (ref 0.450–4.500)

## 2022-10-21 LAB — LIPID PANEL
Cholesterol, Total: 161 mg/dL (ref 100–199)
HDL: 49 mg/dL (ref >39)
LDL CALC COMMENT:: 3.3 ratio (ref 0.0–4.4)
LDL Chol Calc (NIH): 93 mg/dL (ref 0–99)
Triglycerides: 104 mg/dL (ref 0–149)
VLDL Cholesterol Cal: 19 mg/dL (ref 5–40)

## 2022-10-26 ENCOUNTER — Telehealth: Payer: Self-pay | Admitting: Family Medicine

## 2022-10-26 NOTE — Telephone Encounter (Signed)
Pt called to let PCP know that the extra strength tylenol is no longer helping her headaches. Needs advise on what PCP thinks is best.

## 2022-10-26 NOTE — Telephone Encounter (Signed)
Verbal from [place] - Excedrin tension   Pt aware and verbalized understanding

## 2022-10-30 ENCOUNTER — Telehealth: Payer: Self-pay | Admitting: Family Medicine

## 2022-10-30 ENCOUNTER — Ambulatory Visit: Payer: Medicare PPO | Admitting: Urology

## 2022-10-30 NOTE — Telephone Encounter (Signed)
No one specific, just [facility]

## 2022-10-30 NOTE — Telephone Encounter (Signed)
Pt aware and agrees to wait until tomorrow.

## 2022-10-30 NOTE — Telephone Encounter (Signed)
Pt wants to know if there is a specific doctor at [facility] that PCP recommends pt to see?

## 2022-10-30 NOTE — Telephone Encounter (Signed)
Called pt to let her know michelle is out of office today and will return tomorrow. Calling to make sure it is okay for her message to wait until she returns

## 2022-10-31 NOTE — Telephone Encounter (Signed)
Patient aware and verbalizes understanding. 

## 2022-11-02 ENCOUNTER — Ambulatory Visit: Payer: Medicare PPO | Admitting: Family Medicine

## 2022-11-02 ENCOUNTER — Encounter: Payer: Self-pay | Admitting: Family Medicine

## 2022-11-02 VITALS — BP 107/73 | HR 87 | Temp 97.3°F | Ht 63.0 in | Wt 153.8 lb

## 2022-11-02 DIAGNOSIS — D500 Iron deficiency anemia secondary to blood loss (chronic): Secondary | ICD-10-CM | POA: Diagnosis not present

## 2022-11-02 DIAGNOSIS — E538 Deficiency of other specified B group vitamins: Secondary | ICD-10-CM

## 2022-11-02 NOTE — Progress Notes (Signed)
Subjective:  Patient ID: Sherry Levine, female    DOB: 1945/06/16, 77 y.o.   MRN: [id number]  Patient Care Team: Sonny Masters, FNP as PCP - General (Family Medicine)   Chief Complaint:  Labs Only (B12 ) and tounge pain (X 2-3 days )   HPI: Sherry Levine is a 77 y.o. female presenting on 11/02/2022 for Labs Only (B12 ) and tounge pain (X 2-3 days )   Discussed the use of AI scribe software for clinical note transcription with the patient, who gave verbal consent to proceed.  History of Present Illness   The patient, with a history of iron deficiency requiring multiple blood transfusions and infusions, presented with concerns about their B12 and iron levels. They were under the impression that their iron levels were significantly low, which was the primary reason for their visit. They have been on a monthly B12 regimen, which they believed might not be sufficient. She just wanted to go over labs today. She denies any specific complaints or concerns.          Relevant past medical, surgical, family, and social history reviewed and updated as indicated.  Allergies and medications reviewed and updated. Data reviewed: Chart in Epic.   Past Medical History:  Diagnosis Date   Allergy    Anxiety    Clotting disorder (HCC)    Diverticulitis    DVT (deep venous thrombosis) (HCC)    Fibromyalgia    Gout    Osteoarthritis    Osteoarthritis (arthritis due to wear and tear of joints)    Pemphigus    Plantar fasciitis    Pulmonary embolism (HCC)    Thyroid disease     Past Surgical History:  Procedure Laterality Date   ABDOMINAL HYSTERECTOMY     carpel tunnel     right hand   CERVICAL FUSION     CHOLECYSTECTOMY  2015   EYE SURGERY Bilateral    cataracts   KNEE ARTHROSCOPY WITH LATERAL MENISECTOMY     right knee    Social History   Socioeconomic History   Marital status: Married    Spouse name: Not on file   Number of children: Not on file   Years of education: Not  on file   Highest education level: Not on file  Occupational History   Not on file  Tobacco Use   Smoking status: Never   Smokeless tobacco: Never  Vaping Use   Vaping status: Never Used  Substance and Sexual Activity   Alcohol use: No   Drug use: No   Sexual activity: Not on file  Other Topics Concern   Not on file  Social History Narrative   Not on file   Social Determinants of Health   Financial Resource Strain: Low Risk  (11/09/2021)   [facility]   Overall Financial Resource Strain (CARDIA)    Difficulty of Paying Living Expenses: Not hard at all  Food Insecurity: No Food Insecurity (09/22/2022)   Hunger Vital Sign    Worried About Running Out of Food in the Last Year: Never true    Ran Out of Food in the Last Year: Never true  Transportation Needs: No Transportation Needs (09/22/2022)   PRAPARE - Administrator, Civil Service (Medical): No    Lack of Transportation (Non-Medical): No  Physical Activity: Inactive (11/09/2021)   [facility]   Exercise Vital Sign    Days  of Exercise per Week: 0 days    Minutes of Exercise per Session: 0 min  Stress: No Stress Concern Present (12/22/2020)   [facility]   [employer] of Occupational Health - Occupational Stress Questionnaire    Feeling of Stress : Only a little  Social Connections: Unknown (11/09/2021)   [facility]   Social Connection and Isolation Panel [NHANES]    Frequency of Communication with Friends and Family: More than three times a week    Frequency of Social Gatherings with Friends and Family: More than three times a week    Attends Religious Services: More than 4 times per year    Active Member of [employer] or Organizations: Yes    Attends Banker Meetings: More than 4 times per year    Marital Status: Patient declined  Intimate Partner Violence: Not At Risk  (09/22/2022)   Humiliation, Afraid, Rape, and Kick questionnaire    Fear of Current or Ex-Partner: No    Emotionally Abused: No    Physically Abused: No    Sexually Abused: No    Outpatient Encounter Medications as of 11/02/2022  Medication Sig   acetaminophen (TYLENOL) 325 MG tablet Take 2 tablets (650 mg total) by mouth every 6 (six) hours as needed for mild pain or fever (or Fever >/= 101).   cetirizine (ZYRTEC) 10 MG tablet TAKE 1 TABLET BY MOUTH ONCE DAILY   cyanocobalamin (VITAMIN B12) 1000 MCG/ML injection Inject 1,000 mcg into the muscle every 30 (thirty) days.   DULoxetine (CYMBALTA) 60 MG capsule Take 1 capsule (60 mg total) by mouth daily.   levothyroxine (SYNTHROID) 50 MCG tablet Take 1 tablet (50 mcg total) by mouth daily before breakfast.   ondansetron (ZOFRAN-ODT) 4 MG disintegrating tablet Take 4 mg by mouth every 8 (eight) hours as needed for vomiting or nausea.   pantoprazole (PROTONIX) 40 MG tablet TAKE 1 TABLET BY MOUTH ONCE DAILY   polyethylene glycol (MIRALAX / GLYCOLAX) 17 g packet Take 17 g by mouth daily.   rivaroxaban (XARELTO) 10 MG TABS tablet Take 1 tablet (10 mg total) by mouth daily.   senna-docusate (SENOKOT-S) 8.6-50 MG tablet Take 2 tablets by mouth at bedtime.   No facility-administered encounter medications on file as of 11/02/2022.    Allergies  Allergen Reactions   Celebrex [Celecoxib] Anaphylaxis, Hives and Itching   Pollen Extract Other (See Comments)    Stuffy nose  Stuffy nose  Stuffy nose   Trazodone Other (See Comments)    Other reaction(s): vertigo   Colchicine Nausea Only   Hydrocodone-Acetaminophen     Other Reaction(s): feel mentally off   Lyrica [Pregabalin] Other (See Comments)    Dizziness    Omnicef [Cefdinir]     CANT REMEMBER   Penicillins Itching   Zithromax [Azithromycin] Nausea And Vomiting   Codeine Itching and Rash    States that she tolerates vicodin   Oxycodone     Other Reaction(s): feel mentally off  Dry  heaves   Prednisone Anxiety and Other (See Comments)    C/O Confusion  C/O Confusion    Pertinent ROS per HPI, otherwise unremarkable      Objective:  BP 107/73   Pulse 87   Temp (!) 97.3 F (36.3 C) (Temporal)   Ht 5\' 3"  (1.6 m)   Wt 153 lb 12.8 oz (69.8 kg)   LMP 04/05/1982   SpO2 98%   BMI 27.24 kg/m  Wt Readings from Last 3 Encounters:  11/02/22 153 lb 12.8 oz (69.8 kg)  10/20/22 152 lb 9.6 oz (69.2 kg)  09/22/22 152 lb (68.9 kg)    Physical Exam Vitals and nursing note reviewed.  Constitutional:      General: She is not in acute distress.    Appearance: Normal appearance. She is not ill-appearing, toxic-appearing or diaphoretic.  HENT:     Head: Normocephalic and atraumatic.     Mouth/Throat:     Mouth: Mucous membranes are moist.  Eyes:     Conjunctiva/sclera: Conjunctivae normal.     Pupils: Pupils are equal, round, and reactive to light.  Cardiovascular:     Rate and Rhythm: Normal rate.  Pulmonary:     Effort: Pulmonary effort is normal.  Skin:    General: Skin is warm and dry.     Capillary Refill: Capillary refill takes less than 2 seconds.  Neurological:     General: No focal deficit present.     Mental Status: She is alert and oriented to person, place, and time.  Psychiatric:        Mood and Affect: Mood normal.        Behavior: Behavior normal.        Thought Content: Thought content normal.        Judgment: Judgment normal.              Results for orders placed or performed in visit on 10/20/22  Anemia Profile B  Result Value Ref Range   Total Iron Binding Capacity 350 250 - 450 ug/dL   UIBC 161 096 - 045 ug/dL   Iron 35 27 - 409 ug/dL   Iron Saturation 10 (L) 15 - 55 %   Ferritin 23 15 - 150 ng/mL   Vitamin B-12 664 232 - 1,245 pg/mL   Folate 8.7 >3.0 ng/mL   WBC 6.0 3.4 - 10.8 x10E3/uL   RBC 3.70 (L) 3.77 - 5.28 x10E6/uL   Hemoglobin 11.1 11.1 - 15.9 g/dL   Hematocrit 81.1 91.4 - 46.6 %   MCV 96 79 - 97 fL   MCH 30.0  26.6 - 33.0 pg   MCHC 31.3 (L) 31.5 - 35.7 g/dL   RDW 78.2 95.6 - 21.3 %   Platelets 176 150 - 450 x10E3/uL   Neutrophils 65 Not Estab. %   Lymphs 21 Not Estab. %   Monocytes 11 Not Estab. %   Eos 3 Not Estab. %   Basos 0 Not Estab. %   Neutrophils Absolute 3.9 1.4 - 7.0 x10E3/uL   Lymphocytes Absolute 1.2 0.7 - 3.1 x10E3/uL   Monocytes Absolute 0.7 0.1 - 0.9 x10E3/uL   EOS (ABSOLUTE) 0.2 0.0 - 0.4 x10E3/uL   Basophils Absolute 0.0 0.0 - 0.2 x10E3/uL   Immature Granulocytes 0 Not Estab. %   Immature Grans (Abs) 0.0 0.0 - 0.1 x10E3/uL   Retic Ct Pct 1.1 0.6 - 2.6 %  CMP14+EGFR  Result Value Ref Range   Glucose 87 70 - 99 mg/dL   BUN 12 8 - 27 mg/dL   Creatinine, Ser 0.86 0.57 - 1.00 mg/dL   eGFR 64 >57 QI/ONG/2.95   BUN/Creatinine Ratio 13 12 - 28   Sodium 138 134 - 144 mmol/L   Potassium 4.5 3.5 - 5.2 mmol/L   Chloride 102 96 - 106 mmol/L   CO2 22 20 - 29 mmol/L   Calcium 10.3 8.7 - 10.3 mg/dL   Total Protein 7.2 6.0 - 8.5 g/dL  Albumin 3.8 3.8 - 4.8 g/dL   Globulin, Total 3.4 1.5 - 4.5 g/dL   Bilirubin Total 0.4 0.0 - 1.2 mg/dL   Alkaline Phosphatase 53 44 - 121 IU/L   AST 21 0 - 40 IU/L   ALT 14 0 - 32 IU/L  Lipid panel  Result Value Ref Range   Cholesterol, Total 161 100 - 199 mg/dL   Triglycerides 371 0 - 149 mg/dL   HDL 49 >06 mg/dL   VLDL Cholesterol Cal 19 5 - 40 mg/dL   LDL Chol Calc (NIH) 93 0 - 99 mg/dL   Chol/HDL Ratio 3.3 0.0 - 4.4 ratio  Thyroid Panel With TSH  Result Value Ref Range   TSH 1.360 0.450 - 4.500 uIU/mL   T4, Total 10.6 4.5 - 12.0 ug/dL   T3 Uptake Ratio 31 24 - 39 %   Free Thyroxine Index 3.3 1.2 - 4.9  VITAMIN D 25 Hydroxy (Vit-D Deficiency, Fractures)  Result Value Ref Range   Vit D, 25-Hydroxy 33.2 30.0 - 100.0 ng/mL       Pertinent labs & imaging results that were available during my care of the patient were reviewed by me and considered in my medical decision making.  Assessment & Plan:  Antoinette was seen today for labs only and  tounge pain.  Diagnoses and all orders for this visit:  B12 deficiency  Iron deficiency anemia due to chronic blood loss     Assessment and Plan    Iron Deficiency Mildly low iron levels (10, normal is 15 and above). No significant symptoms reported. History of blood transfusions and infusions. -Increase intake of green leafy vegetables and red meats to boost iron levels.  Vitamin B12 Supplementation Normal B12 levels (664, low limit is 230) on current regimen of monthly B12 supplementation. -Continue current B12 supplementation regimen.  Cymbalta Prescription Inquiry about refill status. -Confirmed availability of refills. Patient advised to call in for refill when needed.  Follow-up as needed.          Continue all other maintenance medications.  Follow up plan: Return if symptoms worsen or fail to improve.   Continue healthy lifestyle choices, including diet (rich in fruits, vegetables, and lean proteins, and low in salt and simple carbohydrates) and exercise (at least 30 minutes of moderate physical activity daily).   The above assessment and management plan was discussed with the patient. The patient verbalized understanding of and has agreed to the management plan. Patient is aware to call the clinic if they develop any new symptoms or if symptoms persist or worsen. Patient is aware when to return to the clinic for a follow-up visit. Patient educated on when it is appropriate to go to the emergency department.   Kari Baars, FNP-C [facility] [phone]

## 2022-11-07 ENCOUNTER — Encounter: Payer: Self-pay | Admitting: Family Medicine

## 2022-11-08 ENCOUNTER — Encounter: Payer: Self-pay | Admitting: Family Medicine

## 2022-11-21 ENCOUNTER — Telehealth: Payer: Self-pay | Admitting: Family Medicine

## 2022-11-21 MED ORDER — CYANOCOBALAMIN 1000 MCG/ML IJ SOLN: 1000.0000 ug | INTRAMUSCULAR | 6 refills | Status: DC

## 2022-11-21 NOTE — Telephone Encounter (Signed)
  Prescription Request  11/21/2022  Is this a "Controlled Substance" medicine? no  Have you seen your PCP in the last 2 weeks? No last appt 11/02/22 next appt on 04/30/2022  If YES, route message to pool  -  If NO, patient needs to be scheduled for appointment.  What is the name of the medication or equipment? cyanocobalamin (VITAMIN B12) 1000 MCG/ML injection   Have you contacted your pharmacy to request a refill? no she was getting this medication from another provider at another pharmacy too  Which pharmacy would you like this sent to? [facility]   Patient notified that their request is being sent to the clinical staff for review and that they should receive a response within 2 business days.

## 2022-11-21 NOTE — Addendum Note (Signed)
Addended by: Sonny Masters on: 11/21/2022 03:38 PM   Modules accepted: Orders

## 2022-11-21 NOTE — Telephone Encounter (Signed)
Patient aware and verbalizes understanding. 

## 2022-12-28 ENCOUNTER — Ambulatory Visit: Payer: Medicare PPO

## 2022-12-28 VITALS — Ht 63.0 in | Wt 153.0 lb

## 2022-12-28 DIAGNOSIS — Z0000 Encounter for general adult medical examination without abnormal findings: Secondary | ICD-10-CM

## 2022-12-28 NOTE — Progress Notes (Signed)
Subjective:   Sherry Levine is a 77 y.o. female who presents for Medicare Annual (Subsequent) preventive examination.  Visit Complete: Virtual I connected with  Becky Sax on 12/28/22 by a audio enabled telemedicine application and verified that I am speaking with the correct person using two identifiers.  Patient Location: Home  Provider Location: Home Office  I discussed the limitations of evaluation and management by telemedicine. The patient expressed understanding and agreed to proceed.  Vital Signs: Because this visit was a virtual/telehealth visit, some criteria may be missing or patient reported. Any vitals not documented were not able to be obtained and vitals that have been documented are patient reported.  Cardiac Risk Factors include: advanced age (>44men, >2 women)     Objective:    Today's Vitals   12/28/22 1309  Weight: 153 lb (69.4 kg)  Height: 5\' 3"  (1.6 m)   Body mass index is 27.1 kg/m.     12/28/2022    1:20 PM 09/22/2022    3:57 PM 09/22/2022    9:52 AM 08/10/2022    1:04 PM 02/14/2016    5:18 PM 08/18/2015    3:23 PM 06/30/2015    1:02 PM  Advanced Directives  Does Patient Have a Medical Advance Directive? No Yes No Yes No No No  Type of Advance Directive  Living will;Healthcare Power of Attorney       Does patient want to make changes to medical advance directive?  No - Patient declined       Copy of Healthcare Power of Attorney in Chart?  No - copy requested       Would patient like information on creating a medical advance directive? Yes (MAU/Ambulatory/Procedural Areas - Information given)  No - Patient declined        Current Medications (verified) Outpatient Encounter Medications as of 12/28/2022  Medication Sig   acetaminophen (TYLENOL) 325 MG tablet Take 2 tablets (650 mg total) by mouth every 6 (six) hours as needed for mild pain or fever (or Fever >/= 101).   cetirizine (ZYRTEC) 10 MG tablet TAKE 1 TABLET BY MOUTH ONCE DAILY    cyanocobalamin (VITAMIN B12) 1000 MCG/ML injection Inject 1 mL (1,000 mcg total) into the muscle every 30 (thirty) days.   DULoxetine (CYMBALTA) 60 MG capsule Take 1 capsule (60 mg total) by mouth daily.   levothyroxine (SYNTHROID) 50 MCG tablet Take 1 tablet (50 mcg total) by mouth daily before breakfast.   ondansetron (ZOFRAN-ODT) 4 MG disintegrating tablet Take 4 mg by mouth every 8 (eight) hours as needed for vomiting or nausea.   pantoprazole (PROTONIX) 40 MG tablet TAKE 1 TABLET BY MOUTH ONCE DAILY   polyethylene glycol (MIRALAX / GLYCOLAX) 17 g packet Take 17 g by mouth daily.   rivaroxaban (XARELTO) 10 MG TABS tablet Take 1 tablet (10 mg total) by mouth daily.   senna-docusate (SENOKOT-S) 8.6-50 MG tablet Take 2 tablets by mouth at bedtime.   No facility-administered encounter medications on file as of 12/28/2022.    Allergies (verified) Celebrex [celecoxib], Pollen extract, Trazodone, Colchicine, Hydrocodone-acetaminophen, Lyrica [pregabalin], Omnicef [cefdinir], Penicillins, Zithromax [azithromycin], Codeine, Oxycodone, and Prednisone   History: Past Medical History:  Diagnosis Date   Allergy    Anxiety    Clotting disorder (HCC)    Diverticulitis    DVT (deep venous thrombosis) (HCC)    Fibromyalgia    Gout    Osteoarthritis    Osteoarthritis (arthritis due to wear and tear of joints)  Pemphigus    Plantar fasciitis    Pulmonary embolism (HCC)    Thyroid disease    Past Surgical History:  Procedure Laterality Date   ABDOMINAL HYSTERECTOMY     carpel tunnel     right hand   CERVICAL FUSION     CHOLECYSTECTOMY  2015   EYE SURGERY Bilateral    cataracts   KNEE ARTHROSCOPY WITH LATERAL MENISECTOMY     right knee   Family History  Problem Relation Age of Onset   Stroke Mother    Heart disease Mother    Stroke Father    Heart disease Father    Deep vein thrombosis Son    Pulmonary embolism Son    Social History   Socioeconomic History   Marital status:  Married    Spouse name: Not on file   Number of children: Not on file   Years of education: Not on file   Highest education level: Not on file  Occupational History   Not on file  Tobacco Use   Smoking status: Never   Smokeless tobacco: Never  Vaping Use   Vaping status: Never Used  Substance and Sexual Activity   Alcohol use: No   Drug use: No   Sexual activity: Not on file  Other Topics Concern   Not on file  Social History Narrative   Not on file   Social Drivers of Health   Financial Resource Strain: Low Risk  (12/28/2022)   Overall Financial Resource Strain (CARDIA)    Difficulty of Paying Living Expenses: Not hard at all  Food Insecurity: No Food Insecurity (12/28/2022)   Hunger Vital Sign    Worried About Running Out of Food in the Last Year: Never true    Ran Out of Food in the Last Year: Never true  Transportation Needs: No Transportation Needs (12/28/2022)   PRAPARE - Administrator, Civil Service (Medical): No    Lack of Transportation (Non-Medical): No  Physical Activity: Inactive (12/28/2022)   Exercise Vital Sign    Days of Exercise per Week: 0 days    Minutes of Exercise per Session: 0 min  Stress: Stress Concern Present (12/28/2022)   [employer] of Occupational Health - Occupational Stress Questionnaire    Feeling of Stress : To some extent  Social Connections: Socially Isolated (12/28/2022)   Social Connection and Isolation Panel [NHANES]    Frequency of Communication with Friends and Family: More than three times a week    Frequency of Social Gatherings with Friends and Family: Three times a week    Attends Religious Services: Never    Active Member of Clubs or Organizations: No    Attends Banker Meetings: Never    Marital Status: Widowed    Tobacco Counseling Counseling given: Not Answered   Clinical Intake:  Pre-visit preparation completed: Yes  Pain : No/denies pain     Diabetes: No  How often do you  need to have someone help you when you read instructions, pamphlets, or other written materials from your doctor or pharmacy?: 1 - Never  Interpreter Needed?: No  Information entered by :: Kandis Fantasia LPN   Activities of Daily Living    12/28/2022    1:20 PM 09/22/2022    3:57 PM  In your present state of health, do you have any difficulty performing the following activities:  Hearing? 0 0  Vision? 0 0  Difficulty concentrating or making decisions? 0 0  Walking or climbing  stairs? 1 0  Dressing or bathing? 0 0  Doing errands, shopping? 1 0  Preparing Food and eating ? N   Using the Toilet? N   In the past six months, have you accidently leaked urine? N   Do you have problems with loss of bowel control? N   Managing your Medications? N   Managing your Finances? N   Housekeeping or managing your Housekeeping? Y     Patient Care Team: Sonny Masters, FNP as PCP - General (Family Medicine) Mammography, [facility] (Diagnostic Radiology) Prentiss Bells, PA-C as Physician Assistant (Neurology) Eldred Manges, MD as Consulting Physician (Orthopedic Surgery)  Indicate any recent Medical Services you may have received from other than [facility] providers in the past year (date may be approximate).     Assessment:   This is a routine wellness examination for Tanisha.  Hearing/Vision screen Hearing Screening - Comments:: Denies hearing difficulties   Vision Screening - Comments:: Wears rx glasses - up to date with routine eye exams with previous provider in [place]     Goals Addressed             This Visit's Progress    Regain indpendence        Depression Screen    12/28/2022    1:19 PM 10/20/2022   10:35 AM 08/10/2016    2:18 PM 05/30/2016    2:52 PM 03/09/2016    4:34 PM 02/18/2016    2:26 PM 02/01/2016    4:18 PM  PHQ 2/9 Scores  PHQ - 2 Score 0 0 0 0 0 0 0  PHQ- 9 Score  0         Fall Risk    12/28/2022    1:20 PM 10/20/2022   10:35 AM 08/10/2016    2:18 PM 05/30/2016     2:52 PM 03/09/2016    4:34 PM  Fall Risk   Falls in the past year? 0 0 No Yes No  Number falls in past yr: 0   1   Injury with Fall? 0   No   Risk for fall due to : Impaired balance/gait      Follow up Falls prevention discussed;Education provided;Falls evaluation completed        MEDICARE RISK AT HOME: Medicare Risk at Home Any stairs in or around the home?: No If so, are there any without handrails?: No Home free of loose throw rugs in walkways, pet beds, electrical cords, etc?: Yes Adequate lighting in your home to reduce risk of falls?: Yes Life alert?: No Use of a cane, walker or w/c?: No Grab bars in the bathroom?: Yes Shower chair or bench in shower?: No Elevated toilet seat or a handicapped toilet?: Yes  TIMED UP AND GO:  Was the test performed?  No    Cognitive Function:        12/28/2022    1:20 PM  6CIT Screen  What Year? 0 points  What month? 0 points  What time? 0 points  Count back from 20 0 points  Months in reverse 0 points  Repeat phrase 0 points  Total Score 0 points    Immunizations Immunization History  Administered Date(s) Administered   Fluzone Influenza virus vaccine,trivalent (IIV3), split virus 10/17/2018   Influenza, High Dose Seasonal PF 10/15/2018   Influenza, Quadrivalent, Recombinant, Inj, Pf 10/23/2017   Influenza,inj,Quad PF,6+ Mos 10/15/2015   PFIZER(Purple Top)SARS-COV-2 Vaccination 02/10/2019, 03/04/2019, 01/31/2020   Pneumococcal Conjugate-13 08/19/2015   Pneumococcal Polysaccharide-23 03/16/2012,  03/16/2012   Tdap 11/28/2011, 05/14/2017   Zoster, Live 12/16/2012    TDAP status: Up to date  Flu Vaccine status: Due, Education has been provided regarding the importance of this vaccine. Advised may receive this vaccine at local pharmacy or Health Dept. Aware to provide a copy of the vaccination record if obtained from local pharmacy or Health Dept. Verbalized acceptance and understanding.  Pneumococcal vaccine status: Up to  date  Covid-19 vaccine status: Information provided on how to obtain vaccines.   Qualifies for Shingles Vaccine? Yes   Zostavax completed No   Shingrix Completed?: No.    Education has been provided regarding the importance of this vaccine. Patient has been advised to call insurance company to determine out of pocket expense if they have not yet received this vaccine. Advised may also receive vaccine at local pharmacy or Health Dept. Verbalized acceptance and understanding.  Screening Tests Health Maintenance  Topic Date Due   Hepatitis C Screening  Never done   Zoster Vaccines- Shingrix (1 of 2) 02/01/1995   COVID-19 Vaccine (4 - 2024-25 season) 09/17/2022   INFLUENZA VACCINE  04/16/2023 (Originally 08/17/2022)   Medicare Annual Wellness (AWV)  12/28/2023   DEXA SCAN  02/08/2024   DTaP/Tdap/Td (3 - Td or Tdap) 05/15/2027   Pneumonia Vaccine 79+ Years old  Completed   HPV VACCINES  Aged Out   Colonoscopy  Discontinued    Health Maintenance  Health Maintenance Due  Topic Date Due   Hepatitis C Screening  Never done   Zoster Vaccines- Shingrix (1 of 2) 02/01/1995   COVID-19 Vaccine (4 - 2024-25 season) 09/17/2022    Colorectal cancer screening: No longer required.   Mammogram status: Completed 02/07/22. Repeat every year  Bone Density status: Completed 02/07/22. Results reflect: Bone density results: OSTEOPOROSIS. Repeat every 2 years.  Lung Cancer Screening: (Low Dose CT Chest recommended if Age 78-80 years, 20 pack-year currently smoking OR have quit w/in 15years.) does not qualify.   Lung Cancer Screening Referral: n/a  Additional Screening:  Hepatitis C Screening: does not qualify  Vision Screening: Recommended annual ophthalmology exams for early detection of glaucoma and other disorders of the eye. Is the patient up to date with their annual eye exam?  Yes  Who is the provider or what is the name of the office in which the patient attends annual eye exams? In  [place]; plans to establish locally soon  If pt is not established with a provider, would they like to be referred to a provider to establish care? No .   Dental Screening: Recommended annual dental exams for proper oral hygiene  Community Resource Referral / Chronic Care Management: CRR required this visit?  No   CCM required this visit?  No     Plan:     I have personally reviewed and noted the following in the patient's chart:   Medical and social history Use of alcohol, tobacco or illicit drugs  Current medications and supplements including opioid prescriptions. Patient is not currently taking opioid prescriptions. Functional ability and status Nutritional status Physical activity Advanced directives List of other physicians Hospitalizations, surgeries, and ER visits in previous 12 months Vitals Screenings to include cognitive, depression, and falls Referrals and appointments  In addition, I have reviewed and discussed with patient certain preventive protocols, quality metrics, and best practice recommendations. A written personalized care plan for preventive services as well as general preventive health recommendations were provided to patient.     Durwin Nora, LPN  12/28/2022   After Visit Summary: (Mail) Due to this being a telephonic visit, the after visit summary with patients personalized plan was offered to patient via mail   Nurse Notes: No concerns at this time

## 2022-12-28 NOTE — Patient Instructions (Signed)
Sherry Levine , Thank you for taking time to come for your Medicare Wellness Visit. I appreciate your ongoing commitment to your health goals. Please review the following plan we discussed and let me know if I can assist you in the future.   Referrals/Orders/Follow-Ups/Clinician Recommendations: Aim for 30 minutes of exercise or brisk walking, 6-8 glasses of water, and 5 servings of fruits and vegetables each day.  This is a list of the screening recommended for you and due dates:  Health Maintenance  Topic Date Due   Hepatitis C Screening  Never done   Zoster (Shingles) Vaccine (1 of 2) 02/01/1995   COVID-19 Vaccine (4 - 2024-25 season) 09/17/2022   Flu Shot  04/16/2023*   Medicare Annual Wellness Visit  12/28/2023   DEXA scan (bone density measurement)  02/08/2024   DTaP/Tdap/Td vaccine (3 - Td or Tdap) 05/15/2027   Pneumonia Vaccine  Completed   HPV Vaccine  Aged Out   Colon Cancer Screening  Discontinued  *Topic was postponed. The date shown is not the original due date.    Advanced directives: (ACP Link)Information on Advanced Care Planning can be found at [facility] Advance Health Care Directives Advance Health Care Directives ([email or web address]   Next Medicare Annual Wellness Visit scheduled for next year: Yes

## 2023-01-23 ENCOUNTER — Other Ambulatory Visit: Payer: Self-pay | Admitting: Family Medicine

## 2023-01-26 ENCOUNTER — Ambulatory Visit: Payer: Medicare PPO | Admitting: Family Medicine

## 2023-02-02 ENCOUNTER — Ambulatory Visit (INDEPENDENT_AMBULATORY_CARE_PROVIDER_SITE_OTHER): Payer: Medicare PPO | Admitting: Family Medicine

## 2023-02-02 ENCOUNTER — Ambulatory Visit: Payer: Self-pay | Admitting: Family Medicine

## 2023-02-02 ENCOUNTER — Encounter: Payer: Self-pay | Admitting: Family Medicine

## 2023-02-02 VITALS — BP 119/78 | HR 90 | Temp 98.2°F | Ht 63.0 in | Wt 148.0 lb

## 2023-02-02 DIAGNOSIS — R21 Rash and other nonspecific skin eruption: Secondary | ICD-10-CM

## 2023-02-02 DIAGNOSIS — K581 Irritable bowel syndrome with constipation: Secondary | ICD-10-CM | POA: Diagnosis not present

## 2023-02-02 DIAGNOSIS — K5909 Other constipation: Secondary | ICD-10-CM | POA: Diagnosis not present

## 2023-02-02 MED ORDER — LINACLOTIDE 145 MCG PO CAPS: 145.0000 ug | ORAL_CAPSULE | Freq: Every day | ORAL | 0 refills | Status: DC

## 2023-02-02 NOTE — Progress Notes (Signed)
Subjective:  Patient ID: Sherry Levine, female    DOB: [birth year], 78 y.o.   MRN: [id number]  Patient Care Team: Sonny Masters, FNP as PCP - General (Family Medicine) Mammography, [facility] (Diagnostic Radiology) Prentiss Bells, PA-C as Physician Assistant (Neurology) Eldred Manges, MD as Consulting Physician (Orthopedic Surgery)   Chief Complaint:  Constipation  HPI: Sherry Levine is a 78 y.o. female presenting on 02/02/2023 for Constipation  Patient presents with history of IBS and diverticulosis with increasing constipation for the last 2 weeks. She is currently taking Miralax, sennakot and metamucil. Taking one spoonful daily of miralax. States that she could not get her bowels to move. She used magnesium citrate and had one BM 2 days ago. States that she pieces were very solid and big. States that she put on a pair of gloves and it had a lot of blood on the stool and blood came out into the bowel once she passed the stool. She has not had any more blood in her stool that she noticed since 2 days ago. Reports that she has not had a BM since then. She had colonoscopy one year ago. States that she has tried Linzess in the past and states that it did not work for her. States that she normally has one BM per week. She was previously established with GI in [place]. She has not found a GI here and would like a referral.   She informs me that she is established with Neurology and is being treated for trigeminal neuralgia. She believes that her symptoms correlate to MS. She states that she cannot sleep at night and needs something to sleep.    In addition, states that she has a rash on her back. 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 States that she was previously taking prednisone, but has not taken any in one year. She would like a referral to dermatology as well.   Relevant past medical, surgical, family, and social history reviewed and updated as indicated.   Allergies and medications reviewed and updated. Data reviewed: Chart in Epic.   Past Medical History:  Diagnosis Date   Allergy    Anxiety    Clotting disorder (HCC)    Diverticulitis    DVT (deep venous thrombosis) (HCC)    Fibromyalgia    Gout    Osteoarthritis    Osteoarthritis (arthritis due to wear and tear of joints)    Pemphigus    Plantar fasciitis    Pulmonary embolism (HCC)    Thyroid disease     Past Surgical History:  Procedure Laterality Date   ABDOMINAL HYSTERECTOMY     carpel tunnel     right hand   CERVICAL FUSION     CHOLECYSTECTOMY  2015   EYE SURGERY Bilateral    cataracts   KNEE ARTHROSCOPY WITH LATERAL MENISECTOMY     right knee    Social History   Socioeconomic History   Marital status: Married    Spouse name: Not on file   Number of children: Not on file   Years of education: Not on file   Highest education level: Not on file  Occupational History   Not on file  Tobacco Use   Smoking status: Never   Smokeless tobacco: Never  Vaping Use   Vaping status: Never Used  Substance and Sexual Activity   Alcohol use: No   Drug use: No   Sexual activity: Not on  file  Other Topics Concern   Not on file  Social History Narrative   Not on file   Social Drivers of Health   Financial Resource Strain: Low Risk  (12/28/2022)   Overall Financial Resource Strain (CARDIA)    Difficulty of Paying Living Expenses: Not hard at all  Food Insecurity: No Food Insecurity (12/28/2022)   Hunger Vital Sign    Worried About Running Out of Food in the Last Year: Never true    Ran Out of Food in the Last Year: Never true  Transportation Needs: No Transportation Needs (12/28/2022)   PRAPARE - Administrator, Civil Service (Medical): No    Lack of Transportation (Non-Medical): No  Physical Activity: Inactive (12/28/2022)   Exercise Vital Sign    Days of Exercise per Week: 0 days    Minutes of Exercise per Session: 0 min  Stress: Stress  Concern Present (12/28/2022)   [employer] of Occupational Health - Occupational Stress Questionnaire    Feeling of Stress : To some extent  Social Connections: Socially Isolated (12/28/2022)   Social Connection and Isolation Panel [NHANES]    Frequency of Communication with Friends and Family: More than three times a week    Frequency of Social Gatherings with Friends and Family: Three times a week    Attends Religious Services: Never    Active Member of Clubs or Organizations: No    Attends Banker Meetings: Never    Marital Status: Widowed  Intimate Partner Violence: Not At Risk (12/28/2022)   Humiliation, Afraid, Rape, and Kick questionnaire    Fear of Current or Ex-Partner: No    Emotionally Abused: No    Physically Abused: No    Sexually Abused: No    Outpatient Encounter Medications as of 02/02/2023  Medication Sig   acetaminophen (TYLENOL) 325 MG tablet Take 2 tablets (650 mg total) by mouth every 6 (six) hours as needed for mild pain or fever (or Fever >/= 101).   Azelastine HCl 137 MCG/SPRAY SOLN Place 2 sprays into both nostrils 2 (two) times daily.   cetirizine (ZYRTEC) 10 MG tablet TAKE 1 TABLET BY MOUTH ONCE DAILY   cyanocobalamin (VITAMIN B12) 1000 MCG/ML injection Inject 1 mL (1,000 mcg total) into the muscle every 30 (thirty) days.   DULoxetine (CYMBALTA) 60 MG capsule Take 1 capsule (60 mg total) by mouth daily.   fluticasone (FLONASE) 50 MCG/ACT nasal spray INSTILL 2 SPRAYS IN EACH NOSTRIL EVERY DAY   levothyroxine (SYNTHROID) 50 MCG tablet Take 1 tablet (50 mcg total) by mouth daily before breakfast.   ondansetron (ZOFRAN-ODT) 4 MG disintegrating tablet Take 4 mg by mouth every 8 (eight) hours as needed for vomiting or nausea.   pantoprazole (PROTONIX) 40 MG tablet TAKE 1 TABLET BY MOUTH ONCE DAILY   polyethylene glycol (MIRALAX / GLYCOLAX) 17 g packet Take 17 g by mouth daily.   pregabalin (LYRICA) 25 MG capsule 1 po at bedtime for 7 days then 1  po bid   Psyllium (SM FIBER) 43 % POWD take 1 SCOOP BY MOUTH ONCE DAILY AS DIRECTED   rivaroxaban (XARELTO) 10 MG TABS tablet Take 1 tablet (10 mg total) by mouth daily.   rosuvastatin (CRESTOR) 10 MG tablet Take 10 mg by mouth at bedtime.   senna-docusate (SENOKOT-S) 8.6-50 MG tablet Take 2 tablets by mouth at bedtime.   No facility-administered encounter medications on file as of 02/02/2023.    Allergies  Allergen Reactions   Celebrex [Celecoxib] Anaphylaxis,  Hives and Itching   Pollen Extract Other (See Comments)    Stuffy nose  Stuffy nose  Stuffy nose   Trazodone Other (See Comments)    Other reaction(s): vertigo   Colchicine Nausea Only   Hydrocodone-Acetaminophen     Other Reaction(s): feel mentally off   Lyrica [Pregabalin] Other (See Comments)    Dizziness    Omnicef [Cefdinir]     CANT REMEMBER   Penicillins Itching   Zithromax [Azithromycin] Nausea And Vomiting   Codeine Itching and Rash    States that she tolerates vicodin   Oxycodone     Other Reaction(s): feel mentally off  Dry heaves   Prednisone Anxiety and Other (See Comments)    C/O Confusion  C/O Confusion    Review of Systems As per HPI  Objective:  BP 119/78   Pulse 90   Temp 98.2 F (36.8 C)   Ht 5\' 3"  (1.6 m)   Wt 148 lb (67.1 kg)   LMP 04/05/1982   SpO2 97%   BMI 26.22 kg/m    Wt Readings from Last 3 Encounters:  02/02/23 148 lb (67.1 kg)  12/28/22 153 lb (69.4 kg)  11/02/22 153 lb 12.8 oz (69.8 kg)   Physical Exam Constitutional:      General: She is awake. She is not in acute distress.    Appearance: Normal appearance. She is well-developed and well-groomed. She is not ill-appearing, toxic-appearing or diaphoretic.  Eyes:     Comments: Wearing sunglasses during visit   Cardiovascular:     Rate and Rhythm: Normal rate and regular rhythm.     Pulses: Normal pulses.          Radial pulses are 2+ on the right side and 2+ on the left side.       Posterior tibial pulses are 2+ on  the right side and 2+ on the left side.     Heart sounds: Normal heart sounds. No murmur heard.    No gallop.  Pulmonary:     Effort: Pulmonary effort is normal. No respiratory distress.     Breath sounds: Normal breath sounds. No stridor. No wheezing, rhonchi or rales.  Abdominal:     General: Abdomen is flat. Bowel sounds are normal.     Palpations: Abdomen is soft.     Tenderness: There is no abdominal tenderness.     Hernia: No hernia is present.  Musculoskeletal:     Cervical back: Full passive range of motion without pain and neck supple.     Right lower leg: No edema.     Left lower leg: No edema.  Skin:    General: Skin is warm.     Capillary Refill: Capillary refill takes less than 2 seconds.     Findings: Rash present.     Comments: Multiple small ~23mm lesions along her upper back, dry, intact   Neurological:     General: No focal deficit present.     Mental Status: She is alert, oriented to person, place, and time and easily aroused. Mental status is at baseline.     GCS: GCS eye subscore is 4. GCS verbal subscore is 5. GCS motor subscore is 6.     Motor: No weakness.  Psychiatric:        Attention and Perception: Attention and perception normal.        Mood and Affect: Mood and affect normal.        Speech: Speech normal.  Behavior: Behavior normal. Behavior is cooperative.        Thought Content: Thought content normal. Thought content does not include homicidal or suicidal ideation. Thought content does not include homicidal or suicidal plan.        Cognition and Memory: Cognition and memory normal.        Judgment: Judgment normal.    Results for orders placed or performed in visit on 10/20/22  Anemia Profile B   Collection Time: 10/20/22 10:40 AM  Result Value Ref Range   Total Iron Binding Capacity 350 250 - 450 ug/dL   UIBC 161 096 - 045 ug/dL   Iron 35 27 - 409 ug/dL   Iron Saturation 10 (L) 15 - 55 %   Ferritin 23 15 - 150 ng/mL   Vitamin B-12 664  232 - 1,245 pg/mL   Folate 8.7 >3.0 ng/mL   WBC 6.0 3.4 - 10.8 x10E3/uL   RBC 3.70 (L) 3.77 - 5.28 x10E6/uL   Hemoglobin 11.1 11.1 - 15.9 g/dL   Hematocrit 81.1 91.4 - 46.6 %   MCV 96 79 - 97 fL   MCH 30.0 26.6 - 33.0 pg   MCHC 31.3 (L) 31.5 - 35.7 g/dL   RDW 78.2 95.6 - 21.3 %   Platelets 176 150 - 450 x10E3/uL   Neutrophils 65 Not Estab. %   Lymphs 21 Not Estab. %   Monocytes 11 Not Estab. %   Eos 3 Not Estab. %   Basos 0 Not Estab. %   Neutrophils Absolute 3.9 1.4 - 7.0 x10E3/uL   Lymphocytes Absolute 1.2 0.7 - 3.1 x10E3/uL   Monocytes Absolute 0.7 0.1 - 0.9 x10E3/uL   EOS (ABSOLUTE) 0.2 0.0 - 0.4 x10E3/uL   Basophils Absolute 0.0 0.0 - 0.2 x10E3/uL   Immature Granulocytes 0 Not Estab. %   Immature Grans (Abs) 0.0 0.0 - 0.1 x10E3/uL   Retic Ct Pct 1.1 0.6 - 2.6 %  CMP14+EGFR   Collection Time: 10/20/22 10:40 AM  Result Value Ref Range   Glucose 87 70 - 99 mg/dL   BUN 12 8 - 27 mg/dL   Creatinine, Ser 0.86 0.57 - 1.00 mg/dL   eGFR 64 >57 QI/ONG/2.95   BUN/Creatinine Ratio 13 12 - 28   Sodium 138 134 - 144 mmol/L   Potassium 4.5 3.5 - 5.2 mmol/L   Chloride 102 96 - 106 mmol/L   CO2 22 20 - 29 mmol/L   Calcium 10.3 8.7 - 10.3 mg/dL   Total Protein 7.2 6.0 - 8.5 g/dL   Albumin 3.8 3.8 - 4.8 g/dL   Globulin, Total 3.4 1.5 - 4.5 g/dL   Bilirubin Total 0.4 0.0 - 1.2 mg/dL   Alkaline Phosphatase 53 44 - 121 IU/L   AST 21 0 - 40 IU/L   ALT 14 0 - 32 IU/L  Lipid panel   Collection Time: 10/20/22 10:40 AM  Result Value Ref Range   Cholesterol, Total 161 100 - 199 mg/dL   Triglycerides 284 0 - 149 mg/dL   HDL 49 >13 mg/dL   VLDL Cholesterol Cal 19 5 - 40 mg/dL   LDL Chol Calc (NIH) 93 0 - 99 mg/dL   Chol/HDL Ratio 3.3 0.0 - 4.4 ratio  Thyroid Panel With TSH   Collection Time: 10/20/22 10:40 AM  Result Value Ref Range   TSH 1.360 0.450 - 4.500 uIU/mL   T4, Total 10.6 4.5 - 12.0 ug/dL   T3 Uptake Ratio 31 24 - 39 %   Free  Thyroxine Index 3.3 1.2 - 4.9  VITAMIN D 25  Hydroxy (Vit-D Deficiency, Fractures)   Collection Time: 10/20/22 10:40 AM  Result Value Ref Range   Vit D, 25-Hydroxy 33.2 30.0 - 100.0 ng/mL       02/02/2023   12:26 PM 12/28/2022    1:19 PM 10/20/2022   10:35 AM 08/10/2016    2:18 PM 05/30/2016    2:52 PM  Depression screen PHQ 2/9  Decreased Interest 0 0 0 0 0  Down, Depressed, Hopeless 0 0 0 0 0  PHQ - 2 Score 0 0 0 0 0  Altered sleeping   0    Tired, decreased energy   0    Change in appetite   0    Feeling bad or failure about yourself    0    Trouble concentrating   0    Moving slowly or fidgety/restless   0    Suicidal thoughts   0    PHQ-9 Score   0    Difficult doing work/chores   Not difficult at all        02/02/2023   12:26 PM 10/20/2022   10:35 AM  GAD 7 : Generalized Anxiety Score  Nervous, Anxious, on Edge 0 0  Control/stop worrying 0 0  Worry too much - different things 0 0  Trouble relaxing 0 0  Restless 0 0  Easily annoyed or irritable 0 0  Afraid - awful might happen 0 0  Total GAD 7 Score 0 0  Anxiety Difficulty Not difficult at all Not difficult at all   Pertinent labs & imaging results that were available during my care of the patient were reviewed by me and considered in my medical decision making.  Assessment & Plan:  Taleen was seen today for constipation.  Diagnoses and all orders for this visit:  1. Irritable bowel syndrome with constipation (Primary) Referral placed as below. Provided patient with bowel cleanout regimen. Provided samples of linzess in office. Discussed with patient using OTC glycerin suppositories as needed.  - Ambulatory referral to Gastroenterology - linaclotide (LINZESS) 145 MCG CAPS capsule; Take 1 capsule (145 mcg total) by mouth daily before breakfast.  Dispense: 30 capsule; Refill: 0  2. Chronic constipation As above.  - Ambulatory referral to Gastroenterology - linaclotide (LINZESS) 145 MCG CAPS capsule; Take 1 capsule (145 mcg total) by mouth daily before  breakfast.  Dispense: 30 capsule; Refill: 0  3. Rash Referral placed as below for patient to complete additional evaluation.  - Ambulatory referral to Dermatology  Encouraged patient to follow up with PCP for chronic and additional concerns.   Continue all other maintenance medications.  Follow up plan: Return for appt with PCP next week .   Continue healthy lifestyle choices, including diet (rich in fruits, vegetables, and lean proteins, and low in salt and simple carbohydrates) and exercise (at least 30 minutes of moderate physical activity daily).  Written and verbal instructions provided   The above assessment and management plan was discussed with the patient. The patient verbalized understanding of and has agreed to the management plan. Patient is aware to call the clinic if they develop any new symptoms or if symptoms persist or worsen. Patient is aware when to return to the clinic for a follow-up visit. Patient educated on when it is appropriate to go to the emergency department.   Neale Burly, DNP-FNP [facility] [address] [place], [place] [address] [phone]

## 2023-02-02 NOTE — Patient Instructions (Signed)
Thank you for coming in to clinic today.  1. Your symptoms are consistent with Constipation, likely cause of your General Abdominal Pain / Cramping. 2. Start with Miralax. First dose 68g (4 capfuls) in 32oz water over 1 to 2 hours for clean out. Next day start 17g or 1 capful daily, may adjust dose up or down by half a capful every few days. Recommend to take this medicine daily for next 1-2 weeks, you may need to use it longer if needed. - Goal is to have soft regular bowel movement 1-3x daily, if too runny or diarrhea, then reduce dose of the medicine to every other day.  Improve water intake, hydration will help Also recommend increased vegetables, fruits, fiber intake Can try daily Metamucil or Fiber supplement at pharmacy over the counter  Follow-up if symptoms are not improving with bowel movements, or if pain worsens, develop fevers, nausea, vomiting.  Please schedule a follow-up appointment in 1 month to follow-up Constipation  If you have any other questions or concerns, please feel free to call the clinic to contact me. You may also schedule an earlier appointment if necessary.  However, if your symptoms get significantly worse, please go to the Emergency Department to seek immediate medical attention.  

## 2023-02-02 NOTE — Telephone Encounter (Signed)
Chief Complaint: constipation with blood in stool Symptoms: one BM in 2 weeks  Disposition: [] ED /[] Urgent Care (no appt availability in office) / [x] Appointment(In office/virtual)/ []  [facility]/ [] Home Care/ [] Refused Recommended Disposition /[] [facility]/ []  Follow-up with PCP Additional Notes: Pt complaining of constipation and performed manual removal of stool  on herself on 1/16. Pt stated that is first time she's had a BM in two weeks. Pt stated, "one ball of poop had blood on it and then had two blood clots come out in the toilet. Today my stomach hurts but haven't pooped." Pt stated she battles constipation and can't remember  a time where she hasn't been constipated. Pt uses OTC medication like Miralax and Magnesium Citrate when needed. Per protocol, pt has an appt today with a provider. RN gave care advice and pt verbalized understanding.          Copied from CRM [id number]. Topic: Clinical - Red Word Triage >> Feb 02, 2023 10:35 AM Elle L wrote: Red Word that prompted transfer to Nurse Triage: The patient is constipated and has passed two blood clots. Reason for Disposition  [1] Rectal pain or fullness from fecal impaction (rectum full of stool) AND [2] NOT better after SITZ bath, suppository or enema  Answer Assessment - Initial Assessment Questions 1. STOOL PATTERN OR FREQUENCY: "How often do you have a bowel movement (BM)?"  (Normal range: 3 times a day to every 3 days)  "When was your last BM?"       Yesterday- 2 weeks before that 2. STRAINING: "Do you have to strain to have a BM?"      yes 3. RECTAL PAIN: "Does your rectum hurt when the stool comes out?" If Yes, ask: "Do you have hemorrhoids? How bad is the pain?"  (Scale 1-10; or mild, moderate, severe)     Yes, yes to hemorrhoids,  pain-7 4. STOOL COMPOSITION: "Are the stools hard?"     yes 5. BLOOD ON STOOLS: "Has there been any blood on the toilet tissue or on the surface of the BM?" If Yes,  ask: "When was the last time?"     Yes, 2 blood clots 6. CHRONIC CONSTIPATION: "Is this a new problem for you?"  If No, ask: "How long have you had this problem?" (days, weeks, months)      years 7. CHANGES IN DIET OR HYDRATION: "Have there been any recent changes in your diet?" "How much fluids are you drinking on a daily basis?"  "How much have you had to drink today?"     Drinks lots of decaf tea- drinks 2 bottles/daily 8. MEDICINES: "Have you been taking any new medicines?" "Are you taking any narcotic pain medicines?" (e.g., Dilaudid, morphine, Percocet, Vicodin)     no 9. LAXATIVES: "Have you been using any stool softeners, laxatives, or enemas?"  If Yes, ask "What, how often, and when was the last time?"    Magnesium citrate, Miralax 10. ACTIVITY:  "How much walking do you do every day?"  "Has your activity level decreased in the past week?"        Mainly sits 11. CAUSE: "What do you think is causing the constipation?"        unsure 12. OTHER SYMPTOMS: "Do you have any other symptoms?" (e.g., abdomen pain, bloating, fever, vomiting)       Stomach pain, nauseas 13. MEDICAL HISTORY: "Do you have a history of hemorrhoids, rectal fissures, or rectal surgery or rectal abscess?"  Yes-hemorrhoids  Protocols used: Constipation-A-AH

## 2023-02-06 ENCOUNTER — Telehealth: Payer: Self-pay | Admitting: Family Medicine

## 2023-02-06 NOTE — Telephone Encounter (Signed)
Please see note below.  Copied from CRM [id number]. Topic: Clinical - Medication Question >> Feb 06, 2023 10:16 AM Clayton Bibles wrote: Reason for CRM: She is taking linaclotide (LINZESS) 145 MCG CAPS capsule for bowel movements. It is not working. She wants to know if she can take 2 pills to see if that works. Call her at [phone].

## 2023-02-06 NOTE — Telephone Encounter (Signed)
Patient notified and verbalized understanding. 

## 2023-02-08 ENCOUNTER — Ambulatory Visit: Payer: Medicare PPO | Admitting: Family Medicine

## 2023-02-13 ENCOUNTER — Ambulatory Visit: Payer: Medicare PPO | Admitting: Family Medicine

## 2023-02-16 ENCOUNTER — Telehealth: Payer: Self-pay | Admitting: Family Medicine

## 2023-02-16 NOTE — Telephone Encounter (Unsigned)
Copied from CRM [id number]. Topic: Appointments - Scheduling Inquiry for Clinic >> Feb 16, 2023 11:53 AM Elle L wrote: Reason for CRM: The patient was dismissed from the hospital and will need weekly B12 shots. She is inquiring to see if this is something that she can have done during her appointment. She has the medication but can not administer it to herself. Her call back number is [phone].

## 2023-02-20 NOTE — Telephone Encounter (Signed)
 Patient notified

## 2023-02-22 ENCOUNTER — Telehealth: Payer: Self-pay | Admitting: Family Medicine

## 2023-02-22 NOTE — Telephone Encounter (Signed)
 Copied from CRM [id number]. Topic: Clinical - Medication Question >> Feb 22, 2023 10:41 AM Montie POUR wrote: Reason for CRM: [facility] is reviewing her medication list. [employer] wants to know if Rosaly takes omeprazole  (PRILOSEC) 40 MG or does she take pantoprazole  (PROTONIX ) 40 MG tablet  Please call [phone]; You can talk to anyone. >> Feb 22, 2023 10:51 AM Montie POUR wrote: I did tell [employer] that pantoprazole  (PROTONIX ) 40 MG tablet  was on her list . They need an okay to take one of them off her medication list.

## 2023-02-23 ENCOUNTER — Encounter: Payer: Self-pay | Admitting: Family Medicine

## 2023-02-23 ENCOUNTER — Ambulatory Visit: Payer: Medicare PPO | Admitting: Family Medicine

## 2023-02-23 VITALS — BP 113/70 | HR 90 | Temp 97.9°F | Ht 63.0 in | Wt 154.0 lb

## 2023-02-23 DIAGNOSIS — N3001 Acute cystitis with hematuria: Secondary | ICD-10-CM

## 2023-02-23 DIAGNOSIS — R300 Dysuria: Secondary | ICD-10-CM | POA: Diagnosis not present

## 2023-02-23 DIAGNOSIS — E559 Vitamin D deficiency, unspecified: Secondary | ICD-10-CM

## 2023-02-23 DIAGNOSIS — E538 Deficiency of other specified B group vitamins: Secondary | ICD-10-CM | POA: Diagnosis not present

## 2023-02-23 DIAGNOSIS — K921 Melena: Secondary | ICD-10-CM

## 2023-02-23 DIAGNOSIS — D62 Acute posthemorrhagic anemia: Secondary | ICD-10-CM | POA: Diagnosis not present

## 2023-02-23 DIAGNOSIS — H539 Unspecified visual disturbance: Secondary | ICD-10-CM

## 2023-02-23 LAB — URINALYSIS, ROUTINE W REFLEX MICROSCOPIC
Bilirubin, UA: NEGATIVE
Glucose, UA: NEGATIVE
Ketones, UA: NEGATIVE
Nitrite, UA: POSITIVE — AB
Specific Gravity, UA: 1.02 (ref 1.005–1.030)
Urobilinogen, Ur: 0.2 mg/dL (ref 0.2–1.0)
pH, UA: 5.5 (ref 5.0–7.5)

## 2023-02-23 LAB — MICROSCOPIC EXAMINATION
Renal Epithel, UA: NONE SEEN /[HPF]
WBC, UA: >30 /[HPF] — AB (ref 0–5)
Yeast, UA: NONE SEEN

## 2023-02-23 MED ORDER — SULFAMETHOXAZOLE-TRIMETHOPRIM 800-160 MG PO TABS: 1.0000 | ORAL_TABLET | Freq: Two times a day (BID) | ORAL | 0 refills | Status: AC

## 2023-02-23 MED ORDER — CYANOCOBALAMIN 1000 MCG/ML IJ SOLN
1000.0000 ug | Freq: Once | INTRAMUSCULAR | Status: AC
  Administered 2023-02-23: 1000 ug via INTRAMUSCULAR

## 2023-02-23 NOTE — Progress Notes (Signed)
 Subjective:  Patient ID: Sherry Levine, female    DOB: Jun 14, 1945, 78 y.o.   MRN: [id number]  Patient Care Team: Severa Rock HERO, FNP as PCP - General (Family Medicine) Mammography, [facility] (Diagnostic Radiology) Joycelyn Anes, PA-C as Physician Assistant (Neurology) Barbarann Anes JAYSON, MD as Consulting Physician (Orthopedic Surgery)   Chief Complaint:  Hospitalization Follow-up (02/13/2023/[facility] [place] [facility]- Gastrointestinal hemorrhage) and Hematuria (X 2 days )   HPI: Sherry Levine is a 78 y.o. female presenting on 02/23/2023 for Hospitalization Follow-up (02/13/2023/[facility] - [place] [facility]- Gastrointestinal hemorrhage) and Hematuria (X 2 days )   Discussed the use of AI scribe software for clinical note transcription with the patient, who gave verbal consent to proceed.  History of Present Illness   Sherry Levine is a 78 year old female with anemia and osteoporosis who presents with urinary symptoms and recent hospitalization.  She has new onset urinary symptoms, including dysuria and hematuria, which began either last night or the night before. She describes significant pain during urination, stating it 'just about kills me when I urinate.' Increasing fluid intake, including cranberry juice, has not provided relief. She is currently taking a prescription of 'red pills' twice a day for these symptoms.  She was recently hospitalized at [facility] and then transferred to [facility], where she received two units of blood. Her hemoglobin was 6.1 upon admission and 8.1 at discharge. Since returning home, she feels great overall. No recurrent rectal or gastrointestinal bleeding since her hospital discharge. She has been prescribed B12 injections weekly for four weeks, followed by monthly doses, as per instructions from her gastroenterologist. She has picked up her B12 prescription but has not yet completed the  initial weekly injections due to her recent hospitalization.  She has been diagnosed with osteoporosis, with bone density tests showing osteoporosis in two sites, specifically her right femur, and osteopenia elsewhere. She is currently taking calcium  and vitamin D  supplements. She has a family history of osteoporosis, as her mother suffered from severe osteoporosis, leading to a cervical bone fracture.  She experiences numbness on the right side of her nose and upper lip, which she associates with a trigeminal nerve issue. She has been taking Lyrica for pain management, which she believes has affected her vision, causing it to be cloudy at times over the past two to three months. She does not currently have an eye doctor since moving from [place]. No vision changes, weakness, or confusion.        Relevant past medical, surgical, family, and social history reviewed and updated as indicated.  Allergies and medications reviewed and updated. Data reviewed: Chart in Epic.   Past Medical History:  Diagnosis Date   Allergy    Anxiety    Clotting disorder (HCC)    Diverticulitis    DVT (deep venous thrombosis) (HCC)    Fibromyalgia    Gout    Osteoarthritis    Osteoarthritis (arthritis due to wear and tear of joints)    Pemphigus    Plantar fasciitis    Pulmonary embolism (HCC)    Thyroid  disease     Past Surgical History:  Procedure Laterality Date   ABDOMINAL HYSTERECTOMY     carpel tunnel     right hand   CERVICAL FUSION     CHOLECYSTECTOMY  2015   EYE SURGERY Bilateral    cataracts   KNEE ARTHROSCOPY  WITH LATERAL MENISECTOMY     right knee    Social History   Socioeconomic History   Marital status: Married    Spouse name: Not on file   Number of children: Not on file   Years of education: Not on file   Highest education level: Not on file  Occupational History   Not on file  Tobacco Use   Smoking status: Never   Smokeless tobacco: Never  Vaping Use   Vaping  status: Never Used  Substance and Sexual Activity   Alcohol use: No   Drug use: No   Sexual activity: Not on file  Other Topics Concern   Not on file  Social History Narrative   Not on file   Social Drivers of Health   Financial Resource Strain: Low Risk  (12/28/2022)   Overall Financial Resource Strain (CARDIA)    Difficulty of Paying Living Expenses: Not hard at all  Food Insecurity: Low Risk  (02/13/2023)   Received from [facility]   Hunger Vital Sign    Worried About Running Out of Food in the Last Year: Never true    Ran Out of Food in the Last Year: Never true  Transportation Needs: No Transportation Needs (02/13/2023)   Received from [facility]    In the past 12 months, has lack of reliable transportation kept you from medical appointments, meetings, work or from getting things needed for daily living? : No  Physical Activity: Inactive (12/28/2022)   Exercise Vital Sign    Days of Exercise per Week: 0 days    Minutes of Exercise per Session: 0 min  Stress: Stress Concern Present (12/28/2022)   [employer] of Occupational Health - Occupational Stress Questionnaire    Feeling of Stress : To some extent  Social Connections: Socially Isolated (12/28/2022)   Social Connection and Isolation Panel [NHANES]    Frequency of Communication with Friends and Family: More than three times a week    Frequency of Social Gatherings with Friends and Family: Three times a week    Attends Religious Services: Never    Active Member of Clubs or Organizations: No    Attends Banker Meetings: Never    Marital Status: Widowed  Intimate Partner Violence: Not At Risk (12/28/2022)   Humiliation, Afraid, Rape, and Kick questionnaire    Fear of Current or Ex-Partner: No    Emotionally Abused: No    Physically Abused: No    Sexually Abused: No    Outpatient Encounter Medications as of 02/23/2023  Medication Sig   acetaminophen  (TYLENOL ) 325 MG tablet  Take 2 tablets (650 mg total) by mouth every 6 (six) hours as needed for mild pain or fever (or Fever >/= 101).   apixaban (ELIQUIS) 2.5 MG TABS tablet Take 2.5 mg by mouth 2 (two) times daily.   Azelastine HCl 137 MCG/SPRAY SOLN Place 2 sprays into both nostrils 2 (two) times daily.   cetirizine  (ZYRTEC ) 10 MG tablet TAKE 1 TABLET BY MOUTH ONCE DAILY   cyanocobalamin  (VITAMIN B12) 1000 MCG/ML injection Inject 1 mL (1,000 mcg total) into the muscle every 30 (thirty) days.   DULoxetine  (CYMBALTA ) 60 MG capsule Take 1 capsule (60 mg total) by mouth daily.   fluticasone  (FLONASE ) 50 MCG/ACT nasal spray INSTILL 2 SPRAYS IN EACH NOSTRIL EVERY DAY   levothyroxine  (SYNTHROID ) 50 MCG tablet Take 1 tablet (50 mcg total) by mouth daily before breakfast.   linaclotide  (LINZESS ) 145 MCG CAPS capsule Take  1 capsule (145 mcg total) by mouth daily before breakfast.   ondansetron  (ZOFRAN -ODT) 4 MG disintegrating tablet Take 4 mg by mouth every 8 (eight) hours as needed for vomiting or nausea.   pantoprazole  (PROTONIX ) 40 MG tablet TAKE 1 TABLET BY MOUTH ONCE DAILY   polyethylene glycol (MIRALAX  / GLYCOLAX ) 17 g packet Take 17 g by mouth daily.   pregabalin (LYRICA) 25 MG capsule 1 po at bedtime for 7 days then 1 po bid   Psyllium (SM FIBER) 43 % POWD take 1 SCOOP BY MOUTH ONCE DAILY AS DIRECTED   rosuvastatin  (CRESTOR ) 10 MG tablet Take 10 mg by mouth at bedtime.   senna-docusate (SENOKOT-S) 8.6-50 MG tablet Take 2 tablets by mouth at bedtime.   [DISCONTINUED] rivaroxaban  (XARELTO ) 10 MG TABS tablet Take 1 tablet (10 mg total) by mouth daily.   [EXPIRED] cyanocobalamin  (VITAMIN B12) injection 1,000 mcg    No facility-administered encounter medications on file as of 02/23/2023.    Allergies  Allergen Reactions   Celebrex [Celecoxib] Anaphylaxis, Hives and Itching   Bee Pollen Other (See Comments)    Stuffy nose Stuffy nose  Stuffy nose   Pollen Extract Other (See Comments)    Stuffy nose  Stuffy  nose  Stuffy nose   Trazodone  Other (See Comments)    Other reaction(s): vertigo   Colchicine Nausea Only   Hydrocodone -Acetaminophen      Other Reaction(s): feel mentally off   Lyrica [Pregabalin] Other (See Comments)    Dizziness    Omnicef [Cefdinir]     CANT REMEMBER   Penicillins Itching   Zithromax [Azithromycin] Nausea And Vomiting   Codeine Itching and Rash    States that she tolerates vicodin   Oxycodone      Other Reaction(s): feel mentally off  Dry heaves   Prednisone Anxiety and Other (See Comments)    C/O Confusion  C/O Confusion    Pertinent ROS per HPI, otherwise unremarkable      Objective:  BP 113/70   Pulse 90   Temp 97.9 F (36.6 C)   Ht 5' 3 (1.6 m)   Wt 154 lb (69.9 kg)   LMP 04/05/1982   SpO2 100%   BMI 27.28 kg/m    Wt Readings from Last 3 Encounters:  02/23/23 154 lb (69.9 kg)  02/02/23 148 lb (67.1 kg)  12/28/22 153 lb (69.4 kg)    Physical Exam Vitals and nursing note reviewed.  Constitutional:      Appearance: Normal appearance. She is well-developed, well-groomed and overweight.  HENT:     Head: Normocephalic and atraumatic.     Nose: Nose normal.     Mouth/Throat:     Mouth: Mucous membranes are moist.     Pharynx: Oropharynx is clear.  Eyes:     Conjunctiva/sclera: Conjunctivae normal.     Pupils: Pupils are equal, round, and reactive to light.  Cardiovascular:     Rate and Rhythm: Normal rate and regular rhythm.     Heart sounds: Normal heart sounds.  Pulmonary:     Effort: Pulmonary effort is normal.     Breath sounds: Normal breath sounds.  Abdominal:     General: Bowel sounds are normal.     Palpations: Abdomen is soft.  Musculoskeletal:     Cervical back: Neck supple.     Right lower leg: No edema.     Left lower leg: No edema.  Skin:    General: Skin is warm and dry.     Capillary Refill:  Capillary refill takes less than 2 seconds.     Coloration: Skin is pale.  Neurological:     General: No focal deficit  present.     Mental Status: She is alert and oriented to person, place, and time.     Cranial Nerves: No cranial nerve deficit.     Motor: No weakness.     Gait: Gait normal.  Psychiatric:        Mood and Affect: Mood normal.        Behavior: Behavior normal. Behavior is cooperative.        Thought Content: Thought content normal.        Judgment: Judgment normal.     Results for orders placed or performed in visit on 10/20/22  Anemia Profile B   Collection Time: 10/20/22 10:40 AM  Result Value Ref Range   Total Iron Binding Capacity 350 250 - 450 ug/dL   UIBC 684 881 - 630 ug/dL   Iron 35 27 - 860 ug/dL   Iron Saturation 10 (L) 15 - 55 %   Ferritin 23 15 - 150 ng/mL   Vitamin B-12 664 232 - 1,245 pg/mL   Folate 8.7 >3.0 ng/mL   WBC 6.0 3.4 - 10.8 x10E3/uL   RBC 3.70 (L) 3.77 - 5.28 x10E6/uL   Hemoglobin 11.1 11.1 - 15.9 g/dL   Hematocrit 64.4 65.9 - 46.6 %   MCV 96 79 - 97 fL   MCH 30.0 26.6 - 33.0 pg   MCHC 31.3 (L) 31.5 - 35.7 g/dL   RDW 87.0 88.2 - 84.5 %   Platelets 176 150 - 450 x10E3/uL   Neutrophils 65 Not Estab. %   Lymphs 21 Not Estab. %   Monocytes 11 Not Estab. %   Eos 3 Not Estab. %   Basos 0 Not Estab. %   Neutrophils Absolute 3.9 1.4 - 7.0 x10E3/uL   Lymphocytes Absolute 1.2 0.7 - 3.1 x10E3/uL   Monocytes Absolute 0.7 0.1 - 0.9 x10E3/uL   EOS (ABSOLUTE) 0.2 0.0 - 0.4 x10E3/uL   Basophils Absolute 0.0 0.0 - 0.2 x10E3/uL   Immature Granulocytes 0 Not Estab. %   Immature Grans (Abs) 0.0 0.0 - 0.1 x10E3/uL   Retic Ct Pct 1.1 0.6 - 2.6 %  CMP14+EGFR   Collection Time: 10/20/22 10:40 AM  Result Value Ref Range   Glucose 87 70 - 99 mg/dL   BUN 12 8 - 27 mg/dL   Creatinine, Ser 9.07 0.57 - 1.00 mg/dL   eGFR 64 >40 fO/fpw/8.26   BUN/Creatinine Ratio 13 12 - 28   Sodium 138 134 - 144 mmol/L   Potassium 4.5 3.5 - 5.2 mmol/L   Chloride 102 96 - 106 mmol/L   CO2 22 20 - 29 mmol/L   Calcium  10.3 8.7 - 10.3 mg/dL   Total Protein 7.2 6.0 - 8.5 g/dL   Albumin  3.8 3.8 - 4.8 g/dL   Globulin, Total 3.4 1.5 - 4.5 g/dL   Bilirubin Total 0.4 0.0 - 1.2 mg/dL   Alkaline Phosphatase 53 44 - 121 IU/L   AST 21 0 - 40 IU/L   ALT 14 0 - 32 IU/L  Lipid panel   Collection Time: 10/20/22 10:40 AM  Result Value Ref Range   Cholesterol, Total 161 100 - 199 mg/dL   Triglycerides 895 0 - 149 mg/dL   HDL 49 >60 mg/dL   VLDL Cholesterol Cal 19 5 - 40 mg/dL   LDL Chol Calc (NIH) 93 0 - 99  mg/dL   Chol/HDL Ratio 3.3 0.0 - 4.4 ratio  Thyroid  Panel With TSH   Collection Time: 10/20/22 10:40 AM  Result Value Ref Range   TSH 1.360 0.450 - 4.500 uIU/mL   T4, Total 10.6 4.5 - 12.0 ug/dL   T3 Uptake Ratio 31 24 - 39 %   Free Thyroxine Index 3.3 1.2 - 4.9  VITAMIN D  25 Hydroxy (Vit-D Deficiency, Fractures)   Collection Time: 10/20/22 10:40 AM  Result Value Ref Range   Vit D, 25-Hydroxy 33.2 30.0 - 100.0 ng/mL       Pertinent labs & imaging results that were available during my care of the patient were reviewed by me and considered in my medical decision making.  Assessment & Plan:  Demetria was seen today for hospitalization follow-up and hematuria.  Diagnoses and all orders for this visit:  Acute blood loss anemia -     Anemia Profile B -     cyanocobalamin  (VITAMIN B12) injection 1,000 mcg  Gastrointestinal hemorrhage with melena -     Anemia Profile B  B12 deficiency -     CMP14+EGFR  Dysuria -     Urinalysis, Routine w reflex microscopic -     Urine Culture  Vitamin D  deficiency -     CMP14+EGFR -     VITAMIN D  25 Hydroxy (Vit-D Deficiency, Fractures)  Visual changes -     Ambulatory referral to Ophthalmology     Assessment and Plan    Urinary Tract Infection (UTI) Acute onset of dysuria and hematuria starting either last night or the night before. Reports severe pain during urination and significant blood in the urine. No prior treatment attempted other than cranberry juice, which has not been beneficial. Discussed the need for urine  analysis to confirm UTI and the use of appropriate antibiotics based on urine culture results. - Order urine analysis - Administer antibiotics based on urine culture results  Anemia Recent hospitalization requiring transfusion of two units of blood. Hemoglobin improved from 6.1 to 8.1. Reports feeling significantly better post-transfusion. Discussed monitoring hemoglobin levels and continuing current management. - Monitor hemoglobin levels - Continue current management  Trigeminal Neuralgia Reports numbness on the right side of the nose, upper lip, and associated areas. Currently managed with Lyrica, which has caused some vision changes. Discussed the need for referral to an ophthalmologist for vision changes. - Continue Lyrica - Refer to ophthalmologist for vision changes  Vitamin B12 Deficiency Prescribed B12 injections weekly for four weeks and then monthly. Has not completed the initial four-week course due to recent hospitalization. Discussed the importance of completing the initial course and rechecking B12 levels. - Administer B12 injection today - Schedule B12 injections for the next three Fridays and then monthly following 4th weekly injection  - Recheck B12 levels  Osteoporosis Diagnosed with osteoporosis in the femurs and osteopenia in other sites. Awaiting dental work and advised not to start bone-hardening medications until after the dental procedures. Discussed taking calcium  and vitamin D  supplements and reevaluating osteoporosis treatment post-dental work. - Take calcium  and vitamin D  supplements - Reevaluate osteoporosis treatment post-dental work  General Health Maintenance Requires follow-up with various specialists and routine health maintenance. Discussed the need for referrals to ophthalmology, dermatology, and gastroenterology, and the importance of routine blood work and urine analysis. - Refer to ophthalmologist, dermatology, and gastroenterology - Order blood  work and urine analysis  Follow-up - Follow-up with neurologist on March 5th - Ensure follow-up with gastroenterology and dermatology.  Total time spent with patient 45 minutes.    Continue all other maintenance medications.  Follow up plan: Return in about 1 week (around 03/02/2023) for B12 injection with nurse.   Continue healthy lifestyle choices, including diet (rich in fruits, vegetables, and lean proteins, and low in salt and simple carbohydrates) and exercise (at least 30 minutes of moderate physical activity daily).    The above assessment and management plan was discussed with the patient. The patient verbalized understanding of and has agreed to the management plan. Patient is aware to call the clinic if they develop any new symptoms or if symptoms persist or worsen. Patient is aware when to return to the clinic for a follow-up visit. Patient educated on when it is appropriate to go to the emergency department.   Rosaline Bruns, FNP-C [facility] [phone]

## 2023-02-23 NOTE — Addendum Note (Signed)
 Addended by: Galvin Jules on: 02/23/2023 04:03 PM   Modules accepted: Orders

## 2023-02-24 LAB — ANEMIA PROFILE B
Basophils Absolute: 0 10*3/uL (ref 0.0–0.2)
Basos: 1 %
EOS (ABSOLUTE): 0.1 10*3/uL (ref 0.0–0.4)
Eos: 2 %
Ferritin: 53 ng/mL (ref 15–150)
Folate: 4.4 ng/mL (ref >3.0)
Hematocrit: 31.2 % — ABNORMAL LOW (ref 34.0–46.6)
Hemoglobin: 9.6 g/dL — ABNORMAL LOW (ref 11.1–15.9)
Immature Grans (Abs): 0 10*3/uL (ref 0.0–0.1)
Immature Granulocytes: 0 %
Iron Saturation: 79 % (ref 15–55)
Iron: 273 ug/dL (ref 27–139)
Lymphocytes Absolute: 1.8 10*3/uL (ref 0.7–3.1)
Lymphs: 24 %
MCH: 29.2 pg (ref 26.6–33.0)
MCHC: 30.8 g/dL — ABNORMAL LOW (ref 31.5–35.7)
MCV: 95 fL (ref 79–97)
Monocytes Absolute: 0.7 10*3/uL (ref 0.1–0.9)
Monocytes: 9 %
Neutrophils Absolute: 4.8 10*3/uL (ref 1.4–7.0)
Neutrophils: 64 %
Platelets: 272 10*3/uL (ref 150–450)
RBC: 3.29 x10E6/uL — ABNORMAL LOW (ref 3.77–5.28)
RDW: 14.5 % (ref 11.7–15.4)
Retic Ct Pct: 3 % — ABNORMAL HIGH (ref 0.6–2.6)
Total Iron Binding Capacity: 345 ug/dL (ref 250–450)
UIBC: 72 ug/dL — ABNORMAL LOW (ref 118–369)
Vitamin B-12: >2000 pg/mL — ABNORMAL HIGH (ref 232–1245)
WBC: 7.4 10*3/uL (ref 3.4–10.8)

## 2023-02-24 LAB — CMP14+EGFR
ALT: 9 [IU]/L (ref 0–32)
AST: 18 [IU]/L (ref 0–40)
Albumin: 3.5 g/dL — ABNORMAL LOW (ref 3.8–4.8)
Alkaline Phosphatase: 43 [IU]/L — ABNORMAL LOW (ref 44–121)
BUN/Creatinine Ratio: 14 (ref 12–28)
BUN: 16 mg/dL (ref 8–27)
Bilirubin Total: 0.2 mg/dL (ref 0.0–1.2)
CO2: 20 mmol/L (ref 20–29)
Calcium: 9.3 mg/dL (ref 8.7–10.3)
Chloride: 107 mmol/L — ABNORMAL HIGH (ref 96–106)
Creatinine, Ser: 1.11 mg/dL — ABNORMAL HIGH (ref 0.57–1.00)
Globulin, Total: 2.9 g/dL (ref 1.5–4.5)
Glucose: 78 mg/dL (ref 70–99)
Potassium: 4.4 mmol/L (ref 3.5–5.2)
Sodium: 141 mmol/L (ref 134–144)
Total Protein: 6.4 g/dL (ref 6.0–8.5)
eGFR: 51 mL/min/{1.73_m2} — ABNORMAL LOW (ref >59)

## 2023-02-24 LAB — VITAMIN D 25 HYDROXY (VIT D DEFICIENCY, FRACTURES): Vit D, 25-Hydroxy: 31.7 ng/mL (ref 30.0–100.0)

## 2023-02-27 ENCOUNTER — Other Ambulatory Visit: Payer: Self-pay | Admitting: *Deleted

## 2023-02-27 DIAGNOSIS — D500 Iron deficiency anemia secondary to blood loss (chronic): Secondary | ICD-10-CM

## 2023-02-27 DIAGNOSIS — N289 Disorder of kidney and ureter, unspecified: Secondary | ICD-10-CM

## 2023-02-27 LAB — URINE CULTURE

## 2023-03-01 ENCOUNTER — Ambulatory Visit: Payer: Medicare PPO | Admitting: Family Medicine

## 2023-03-01 ENCOUNTER — Ambulatory Visit: Payer: Self-pay | Admitting: Family Medicine

## 2023-03-01 NOTE — Telephone Encounter (Signed)
Appt made

## 2023-03-01 NOTE — Telephone Encounter (Signed)
Copied from CRM [id number]. Topic: Clinical - Red Word Triage >> Mar 01, 2023  8:03 AM Elle L wrote: Red Word that prompted transfer to Nurse Triage: The patient was calling to cancel her appointment for today. However, she told me that she has a fever of 101, congested, discolored mucous, headache, she has been having body aches and chills.   Chief Complaint: Fever Symptoms: Congestion, Fever, Yellow Sputum Frequency: One Week Pertinent Negatives: Patient denies chest pain, dyspnea Disposition: [] ED /[] Urgent Care (no appt availability in office) / [x] Appointment(In office/virtual)/ []  [facility]/ [] Home Care/ [] Refused Recommended Disposition /[] [facility]/ []  Follow-up with PCP Additional Notes: Patient contacted via phone regarding concerns of Fever and wanting to cancel her appointment. Discussed symptoms, severity, and duration. Based on assessment, patient was advised to see PCP in office and keep appointment scheduled for today. Patient verbalized understanding and agreement with plan. Documentation provided.     Reason for Disposition  [1] Fever > 101 F (38.3 C) AND [2] age > 60 years  Answer Assessment - Initial Assessment Questions 1. TEMPERATURE: "What is the most recent temperature?"  "How was it measured?"      101 2. ONSET: "When did the fever start?"      A week ago  3. CHILLS: "Do you have chills?" If yes: "How bad are they?"  (e.g., none, mild, moderate, severe)   - NONE: no chills   - MILD: feeling cold   - MODERATE: feeling very cold, some shivering (feels better under a thick blanket)   - SEVERE: feeling extremely cold with shaking chills (general body shaking, rigors; even under a thick blanket)      Moderate  4. OTHER SYMPTOMS: "Do you have any other symptoms besides the fever?"  (e.g., abdomen pain, cough, diarrhea, earache, headache, sore throat, urination pain)     Congestion, Body Aches, Yellow Phlegm  5. CAUSE: If there are no  symptoms, ask: "What do you think is causing the fever?"      Unsure  6. CONTACTS: "Does anyone else in the family have an infection?"     Yes  7. TREATMENT: "What have you done so far to treat this fever?" (e.g., medications)     Tylenol, fever consists  8. IMMUNOCOMPROMISE: "Do you have of the following: diabetes, HIV positive, splenectomy, cancer chemotherapy, chronic steroid treatment, transplant patient, etc."     Trigeminal Neuralgia  9. PREGNANCY: "Is there any chance you are pregnant?" "When was your last menstrual period?"     No, and no  10. TRAVEL: "Have you traveled out of the country in the last month?" (e.g., travel history, exposures)       No  Protocols used: [facility]

## 2023-03-02 ENCOUNTER — Ambulatory Visit: Payer: Medicare PPO

## 2023-03-06 ENCOUNTER — Ambulatory Visit: Payer: Medicare PPO | Admitting: Family Medicine

## 2023-03-07 ENCOUNTER — Telehealth: Payer: Self-pay | Admitting: Internal Medicine

## 2023-03-07 NOTE — Telephone Encounter (Signed)
 Patient called requesting to schedule with Dr. Rhea Belton. Patient recently had a procedure with [facility]. Patient will have procedure records sent for Dr. Rhea Belton to review.

## 2023-03-09 ENCOUNTER — Ambulatory Visit: Payer: Medicare PPO

## 2023-03-13 ENCOUNTER — Other Ambulatory Visit: Payer: Medicare PPO

## 2023-03-14 ENCOUNTER — Encounter: Payer: Self-pay | Admitting: Family Medicine

## 2023-03-14 ENCOUNTER — Ambulatory Visit (INDEPENDENT_AMBULATORY_CARE_PROVIDER_SITE_OTHER): Payer: Medicare PPO

## 2023-03-14 VITALS — BP 102/67 | HR 84 | Temp 98.3°F | Ht 63.0 in | Wt 156.0 lb

## 2023-03-14 DIAGNOSIS — N898 Other specified noninflammatory disorders of vagina: Secondary | ICD-10-CM

## 2023-03-14 DIAGNOSIS — R300 Dysuria: Secondary | ICD-10-CM

## 2023-03-14 DIAGNOSIS — F331 Major depressive disorder, recurrent, moderate: Secondary | ICD-10-CM | POA: Diagnosis not present

## 2023-03-14 LAB — URINALYSIS, ROUTINE W REFLEX MICROSCOPIC
Bilirubin, UA: NEGATIVE
Glucose, UA: NEGATIVE
Ketones, UA: NEGATIVE
Nitrite, UA: NEGATIVE
Protein,UA: NEGATIVE
RBC, UA: NEGATIVE
Specific Gravity, UA: 1.02 (ref 1.005–1.030)
Urobilinogen, Ur: 0.2 mg/dL (ref 0.2–1.0)
pH, UA: 6 (ref 5.0–7.5)

## 2023-03-14 LAB — ANEMIA PROFILE B
Basophils Absolute: 0 10*3/uL (ref 0.0–0.2)
Basos: 1 %
EOS (ABSOLUTE): 0.1 10*3/uL (ref 0.0–0.4)
Eos: 2 %
Ferritin: 44 ng/mL (ref 15–150)
Folate: 5.6 ng/mL (ref >3.0)
Hematocrit: 33.2 % — ABNORMAL LOW (ref 34.0–46.6)
Hemoglobin: 10.6 g/dL — ABNORMAL LOW (ref 11.1–15.9)
Immature Grans (Abs): 0 10*3/uL (ref 0.0–0.1)
Immature Granulocytes: 0 %
Iron Saturation: 8 % — CL (ref 15–55)
Iron: 28 ug/dL (ref 27–139)
Lymphocytes Absolute: 1.9 10*3/uL (ref 0.7–3.1)
Lymphs: 33 %
MCH: 30.1 pg (ref 26.6–33.0)
MCHC: 31.9 g/dL (ref 31.5–35.7)
MCV: 94 fL (ref 79–97)
Monocytes Absolute: 0.7 10*3/uL (ref 0.1–0.9)
Monocytes: 12 %
Neutrophils Absolute: 3 10*3/uL (ref 1.4–7.0)
Neutrophils: 52 %
Platelets: 206 10*3/uL (ref 150–450)
RBC: 3.52 x10E6/uL — ABNORMAL LOW (ref 3.77–5.28)
RDW: 14 % (ref 11.7–15.4)
Retic Ct Pct: 1.4 % (ref 0.6–2.6)
Total Iron Binding Capacity: 345 ug/dL (ref 250–450)
UIBC: 317 ug/dL (ref 118–369)
Vitamin B-12: 757 pg/mL (ref 232–1245)
WBC: 5.7 10*3/uL (ref 3.4–10.8)

## 2023-03-14 LAB — MICROSCOPIC EXAMINATION
Epithelial Cells (non renal): NONE SEEN /HPF (ref 0–10)
RBC, Urine: NONE SEEN /HPF (ref 0–2)
Yeast, UA: NONE SEEN

## 2023-03-14 LAB — CMP14+EGFR
ALT: 11 IU/L (ref 0–32)
AST: 21 IU/L (ref 0–40)
Albumin: 3.8 g/dL (ref 3.8–4.8)
Alkaline Phosphatase: 44 IU/L (ref 44–121)
BUN/Creatinine Ratio: 16 (ref 12–28)
BUN: 13 mg/dL (ref 8–27)
Bilirubin Total: 0.2 mg/dL (ref 0.0–1.2)
CO2: 22 mmol/L (ref 20–29)
Calcium: 9.6 mg/dL (ref 8.7–10.3)
Chloride: 106 mmol/L (ref 96–106)
Creatinine, Ser: 0.8 mg/dL (ref 0.57–1.00)
Globulin, Total: 2.9 g/dL (ref 1.5–4.5)
Glucose: 83 mg/dL (ref 70–99)
Potassium: 4.5 mmol/L (ref 3.5–5.2)
Sodium: 139 mmol/L (ref 134–144)
Total Protein: 6.7 g/dL (ref 6.0–8.5)
eGFR: 75 mL/min/{1.73_m2} (ref >59)

## 2023-03-14 LAB — WET PREP FOR TRICH, YEAST, CLUE
Clue Cell Exam: NEGATIVE
Trichomonas Exam: NEGATIVE
Yeast Exam: NEGATIVE

## 2023-03-14 NOTE — Progress Notes (Signed)
 Subjective:  Patient ID: Sherry Levine, female    DOB: 1945-07-05, 78 y.o.   MRN: [id number]  Patient Care Team: Sonny Masters, FNP as PCP - General (Family Medicine) Mammography, [facility] (Diagnostic Radiology) Prentiss Bells, PA-C as Physician Assistant (Neurology) Eldred Manges, MD as Consulting Physician (Orthopedic Surgery)   Chief Complaint:  Dysuria   HPI: Sherry Levine is a 78 y.o. female presenting on 03/14/2023 for Dysuria  HPI 1. Dysuria States that she was treated on 02/23/2023 from [facility] for UTI.  States that she has burning, itching, and incontinence. Endorses vaginal discharge that started 3-4 days ago, describes it as thick and yellow. Denies fever, n/v.  Endorses pain on "right kidney" She has completed Keflex.  She previously had yeast infections and noticed them after taking abx.   Depression  States that "it just came over me".  Reports that her son died at 6 with DVT/PE, then lost husband in 2021. States that she cannot sleep, her mind will not slow down at night. Reports that she is replaying everything at night. Denies any thoughts of SI. Reports symptoms intermittently throughout the day, mainly at night. She is currently taking cymbalta (for fibromyalgia).   Relevant past medical, surgical, family, and social history reviewed and updated as indicated.  Allergies and medications reviewed and updated. Data reviewed: Chart in Epic.   Past Medical History:  Diagnosis Date   Allergy    Anxiety    Clotting disorder (HCC)    Diverticulitis    DVT (deep venous thrombosis) (HCC)    Fibromyalgia    Gout    Osteoarthritis    Osteoarthritis (arthritis due to wear and tear of joints)    Pemphigus    Plantar fasciitis    Pulmonary embolism (HCC)    Thyroid disease     Past Surgical History:  Procedure Laterality Date   ABDOMINAL HYSTERECTOMY     carpel tunnel     right hand   CERVICAL FUSION     CHOLECYSTECTOMY  2015   EYE SURGERY Bilateral    cataracts    KNEE ARTHROSCOPY WITH LATERAL MENISECTOMY     right knee    Social History   Socioeconomic History   Marital status: Married    Spouse name: Not on file   Number of children: Not on file   Years of education: Not on file   Highest education level: Not on file  Occupational History   Not on file  Tobacco Use   Smoking status: Never   Smokeless tobacco: Never  Vaping Use   Vaping status: Never Used  Substance and Sexual Activity   Alcohol use: No   Drug use: No   Sexual activity: Not on file  Other Topics Concern   Not on file  Social History Narrative   Not on file   Social Drivers of Health   Financial Resource Strain: Low Risk  (12/28/2022)   Overall Financial Resource Strain (CARDIA)    Difficulty of Paying Living Expenses: Not hard at all  Food Insecurity: Low Risk  (02/13/2023)   Received from [facility]   Hunger Vital Sign    Worried About Running Out of Food in the Last Year: Never true    Ran Out of Food in the Last Year: Never true  Transportation Needs: No Transportation Needs (02/13/2023)   Received from [facility]    In the past 12 months, has lack of reliable transportation kept  you from medical appointments, meetings, work or from getting things needed for daily living? : No  Physical Activity: Inactive (12/28/2022)   Exercise Vital Sign    Days of Exercise per Week: 0 days    Minutes of Exercise per Session: 0 min  Stress: Stress Concern Present (12/28/2022)   [employer] of Occupational Health - Occupational Stress Questionnaire    Feeling of Stress : To some extent  Social Connections: Socially Isolated (12/28/2022)   Social Connection and Isolation Panel [NHANES]    Frequency of Communication with Friends and Family: More than three times a week    Frequency of Social Gatherings with Friends and Family: Three times a week    Attends Religious Services: Never    Active Member of Clubs or Organizations: No    Attends  Banker Meetings: Never    Marital Status: Widowed  Intimate Partner Violence: Not At Risk (12/28/2022)   Humiliation, Afraid, Rape, and Kick questionnaire    Fear of Current or Ex-Partner: No    Emotionally Abused: No    Physically Abused: No    Sexually Abused: No    Outpatient Encounter Medications as of 03/14/2023  Medication Sig   acetaminophen (TYLENOL) 325 MG tablet Take 2 tablets (650 mg total) by mouth every 6 (six) hours as needed for mild pain or fever (or Fever >/= 101).   apixaban (ELIQUIS) 2.5 MG TABS tablet Take 2.5 mg by mouth 2 (two) times daily.   Azelastine HCl 137 MCG/SPRAY SOLN Place 2 sprays into both nostrils 2 (two) times daily.   cetirizine (ZYRTEC) 10 MG tablet TAKE 1 TABLET BY MOUTH ONCE DAILY   cyanocobalamin (VITAMIN B12) 1000 MCG/ML injection Inject 1 mL (1,000 mcg total) into the muscle every 30 (thirty) days.   DULoxetine (CYMBALTA) 60 MG capsule Take 1 capsule (60 mg total) by mouth daily.   fluticasone (FLONASE) 50 MCG/ACT nasal spray INSTILL 2 SPRAYS IN EACH NOSTRIL EVERY DAY   levothyroxine (SYNTHROID) 50 MCG tablet Take 1 tablet (50 mcg total) by mouth daily before breakfast.   linaclotide (LINZESS) 145 MCG CAPS capsule Take 1 capsule (145 mcg total) by mouth daily before breakfast.   omeprazole (PRILOSEC) 40 MG capsule    ondansetron (ZOFRAN-ODT) 4 MG disintegrating tablet Take 4 mg by mouth every 8 (eight) hours as needed for vomiting or nausea.   pantoprazole (PROTONIX) 40 MG tablet TAKE 1 TABLET BY MOUTH ONCE DAILY   polyethylene glycol (MIRALAX / GLYCOLAX) 17 g packet Take 17 g by mouth daily.   pregabalin (LYRICA) 50 MG capsule Take 1 capsule by mouth 2 (two) times daily.   Psyllium (SM FIBER) 43 % POWD take 1 SCOOP BY MOUTH ONCE DAILY AS DIRECTED   rosuvastatin (CRESTOR) 10 MG tablet Take 10 mg by mouth at bedtime.   senna-docusate (SENOKOT-S) 8.6-50 MG tablet Take 2 tablets by mouth at bedtime.   [DISCONTINUED] pregabalin (LYRICA)  25 MG capsule 1 po at bedtime for 7 days then 1 po bid   No facility-administered encounter medications on file as of 03/14/2023.    Allergies  Allergen Reactions   Celebrex [Celecoxib] Anaphylaxis, Hives and Itching   Bee Pollen Other (See Comments)    Stuffy nose Stuffy nose  Stuffy nose   Pollen Extract Other (See Comments)    Stuffy nose  Stuffy nose  Stuffy nose   Trazodone Other (See Comments)    Other reaction(s): vertigo   Colchicine Nausea Only   Hydrocodone-Acetaminophen  Other Reaction(s): feel mentally off   Lyrica [Pregabalin] Other (See Comments)    Dizziness    Omnicef [Cefdinir]     CANT REMEMBER   Penicillins Itching   Zithromax [Azithromycin] Nausea And Vomiting   Codeine Itching and Rash    States that she tolerates vicodin   Oxycodone     Other Reaction(s): feel mentally off  Dry heaves   Prednisone Anxiety and Other (See Comments)    C/O Confusion  C/O Confusion    Review of Systems As per HPI Objective:  BP 102/67   Pulse 84   Temp 98.3 F (36.8 C)   Ht 5\' 3"  (1.6 m)   Wt 156 lb (70.8 kg)   LMP 04/05/1982   SpO2 98%   BMI 27.63 kg/m    Wt Readings from Last 3 Encounters:  03/14/23 156 lb (70.8 kg)  02/23/23 154 lb (69.9 kg)  02/02/23 148 lb (67.1 kg)    Physical Exam Constitutional:      General: She is awake. She is not in acute distress.    Appearance: Normal appearance. She is well-developed and well-groomed. She is not ill-appearing, toxic-appearing or diaphoretic.  Cardiovascular:     Rate and Rhythm: Normal rate and regular rhythm.     Pulses: Normal pulses.          Radial pulses are 2+ on the right side and 2+ on the left side.       Posterior tibial pulses are 2+ on the right side and 2+ on the left side.     Heart sounds: Normal heart sounds. No murmur heard.    No gallop.  Pulmonary:     Effort: Pulmonary effort is normal. No respiratory distress.     Breath sounds: Normal breath sounds. No stridor. No wheezing,  rhonchi or rales.  Abdominal:     General: Abdomen is flat. Bowel sounds are normal. There is no distension.     Palpations: Abdomen is soft. There is no mass.     Tenderness: There is abdominal tenderness. There is right CVA tenderness and left CVA tenderness. There is no guarding or rebound.     Hernia: No hernia is present.  Musculoskeletal:     Cervical back: Full passive range of motion without pain and neck supple.     Right lower leg: No edema.     Left lower leg: No edema.  Skin:    General: Skin is warm.     Capillary Refill: Capillary refill takes less than 2 seconds.  Neurological:     General: No focal deficit present.     Mental Status: She is alert, oriented to person, place, and time and easily aroused. Mental status is at baseline.     GCS: GCS eye subscore is 4. GCS verbal subscore is 5. GCS motor subscore is 6.     Motor: No weakness.  Psychiatric:        Attention and Perception: Attention and perception normal.        Mood and Affect: Mood and affect normal.        Speech: Speech normal.        Behavior: Behavior normal. Behavior is cooperative.        Thought Content: Thought content normal. Thought content does not include homicidal or suicidal ideation. Thought content does not include homicidal or suicidal plan.        Cognition and Memory: Cognition and memory normal.        Judgment:  Judgment normal.      Results for orders placed or performed in visit on 03/13/23  Anemia Profile B   Collection Time: 03/13/23  2:18 PM  Result Value Ref Range   Total Iron Binding Capacity 345 250 - 450 ug/dL   UIBC 161 096 - 045 ug/dL   Iron 28 27 - 409 ug/dL   Iron Saturation 8 (LL) 15 - 55 %   Ferritin 44 15 - 150 ng/mL   Vitamin B-12 757 232 - 1,245 pg/mL   Folate 5.6 >3.0 ng/mL   WBC 5.7 3.4 - 10.8 x10E3/uL   RBC 3.52 (L) 3.77 - 5.28 x10E6/uL   Hemoglobin 10.6 (L) 11.1 - 15.9 g/dL   Hematocrit 81.1 (L) 91.4 - 46.6 %   MCV 94 79 - 97 fL   MCH 30.1 26.6 - 33.0  pg   MCHC 31.9 31.5 - 35.7 g/dL   RDW 78.2 95.6 - 21.3 %   Platelets 206 150 - 450 x10E3/uL   Neutrophils 52 Not Estab. %   Lymphs 33 Not Estab. %   Monocytes 12 Not Estab. %   Eos 2 Not Estab. %   Basos 1 Not Estab. %   Neutrophils Absolute 3.0 1.4 - 7.0 x10E3/uL   Lymphocytes Absolute 1.9 0.7 - 3.1 x10E3/uL   Monocytes Absolute 0.7 0.1 - 0.9 x10E3/uL   EOS (ABSOLUTE) 0.1 0.0 - 0.4 x10E3/uL   Basophils Absolute 0.0 0.0 - 0.2 x10E3/uL   Immature Granulocytes 0 Not Estab. %   Immature Grans (Abs) 0.0 0.0 - 0.1 x10E3/uL   Retic Ct Pct 1.4 0.6 - 2.6 %  CMP14+EGFR   Collection Time: 03/13/23  2:18 PM  Result Value Ref Range   Glucose 83 70 - 99 mg/dL   BUN 13 8 - 27 mg/dL   Creatinine, Ser 0.86 0.57 - 1.00 mg/dL   eGFR 75 >57 QI/ONG/2.95   BUN/Creatinine Ratio 16 12 - 28   Sodium 139 134 - 144 mmol/L   Potassium 4.5 3.5 - 5.2 mmol/L   Chloride 106 96 - 106 mmol/L   CO2 22 20 - 29 mmol/L   Calcium 9.6 8.7 - 10.3 mg/dL   Total Protein 6.7 6.0 - 8.5 g/dL   Albumin 3.8 3.8 - 4.8 g/dL   Globulin, Total 2.9 1.5 - 4.5 g/dL   Bilirubin Total 0.2 0.0 - 1.2 mg/dL   Alkaline Phosphatase 44 44 - 121 IU/L   AST 21 0 - 40 IU/L   ALT 11 0 - 32 IU/L       02/02/2023   12:26 PM 12/28/2022    1:19 PM 10/20/2022   10:35 AM 08/10/2016    2:18 PM 05/30/2016    2:52 PM  Depression screen PHQ 2/9  Decreased Interest 0 0 0 0 0  Down, Depressed, Hopeless 0 0 0 0 0  PHQ - 2 Score 0 0 0 0 0  Altered sleeping   0    Tired, decreased energy   0    Change in appetite   0    Feeling bad or failure about yourself    0    Trouble concentrating   0    Moving slowly or fidgety/restless   0    Suicidal thoughts   0    PHQ-9 Score   0    Difficult doing work/chores   Not difficult at all         02/02/2023   12:26 PM 10/20/2022   10:35 AM  GAD 7 : Generalized Anxiety Score  Nervous, Anxious, on Edge 0 0  Control/stop worrying 0 0  Worry too much - different things 0 0  Trouble relaxing 0 0   Restless 0 0  Easily annoyed or irritable 0 0  Afraid - awful might happen 0 0  Total GAD 7 Score 0 0  Anxiety Difficulty Not difficult at all Not difficult at all   Pertinent labs & imaging results that were available during my care of the patient were reviewed by me and considered in my medical decision making.  Assessment & Plan:  Jetta was seen today for dysuria.  Diagnoses and all orders for this visit: 1. Dysuria (Primary) Based on UA, will await results of culture to treat.  - Urinalysis, Routine w reflex microscopic - Urine Culture  2. Vaginal discharge Negative wet prep. Will await results of culture to treat.  - WET PREP FOR TRICH, YEAST, CLUE  3. Moderate recurrent major depression (HCC) Not at goal. Patient has increased symptoms of depression and insomnia. Patient declined hydroxyzine. She has an allergy to trazodone. She is currently taking lyrica and cymbalta, do not wish to add sedating medications such as buspar due to recent falls and GIB. Recommend patient follow up with PCP, she declined appt at this time. Denies SI.   Continue all other maintenance medications.  Follow up plan: Return if symptoms worsen or fail to improve.   Continue healthy lifestyle choices, including diet (rich in fruits, vegetables, and lean proteins, and low in salt and simple carbohydrates) and exercise (at least 30 minutes of moderate physical activity daily).  Written and verbal instructions provided   The above assessment and management plan was discussed with the patient. The patient verbalized understanding of and has agreed to the management plan. Patient is aware to call the clinic if they develop any new symptoms or if symptoms persist or worsen. Patient is aware when to return to the clinic for a follow-up visit. Patient educated on when it is appropriate to go to the emergency department.   Neale Burly, DNP-FNP [facility] [address] [place], [place] [address] [phone]

## 2023-03-16 LAB — URINE CULTURE

## 2023-03-16 NOTE — Progress Notes (Signed)
 Negative for UTI on culture, recommend follow up if symptoms continue

## 2023-03-19 ENCOUNTER — Ambulatory Visit: Payer: Medicare PPO

## 2023-03-23 ENCOUNTER — Ambulatory Visit

## 2023-03-23 DIAGNOSIS — E538 Deficiency of other specified B group vitamins: Secondary | ICD-10-CM | POA: Diagnosis not present

## 2023-03-23 MED ORDER — CYANOCOBALAMIN 1000 MCG/ML IJ SOLN
1000.0000 ug | Freq: Once | INTRAMUSCULAR | Status: AC
  Administered 2023-03-23: 1000 ug via INTRAMUSCULAR

## 2023-03-23 NOTE — Progress Notes (Signed)
 Patient is in office today for a nurse visit for B12 Injection. Patient Injection was given in the  Left deltoid. Patient tolerated injection well.

## 2023-04-20 ENCOUNTER — Encounter: Payer: Medicare PPO | Admitting: Family Medicine

## 2023-08-07 ENCOUNTER — Encounter: Admitting: Family Medicine

## 2023-08-14 NOTE — Progress Notes (Signed)
I have performed a dermatologically relevant history and physical examination. I have planned and supervised proceduresand the evaluation and treatment plan and I agree with the above.

## 2023-08-14 NOTE — Progress Notes (Signed)
 Subjective: Sherry Levine is a 78 y.o. female who presents to dermatology clinic today as a new patient with the following skin concern(s):   Seen about 20 years ago. Took a biopsy and was told it was eczema (reviewed - [facility]). Told it could be bed bugs. Dr. Sterling in [place] looked at it and said it was pemphigus. Was on prednisone for 4 years with resolution of lesions. Recently told she has osteoporosis, never was on Ca/Vit D supplementation during this time.  It itches and causes scars on her back which she does not like. Also get full body tingling. Is on pregabalin and duloxetine  for trigeminal neuralgia. Last prednisone was Jan 2024. Triamcinolone  0.025% cream does help some.  Objective:  Ht 1.6 m (5' 3)   Wt 72.2 kg (159 lb 2.2 oz)   BMI 28.19 kg/m   Patient is 78 y.o. female in no acute distress. Exam today focused on the skin.   Notable Findings include: Excoriations and scarring on upper back  No primary rash  Assessment:  1. Idiopathic small fiber sensory neuropathy      2. Asteatotic eczema      3. Formication        Plan: The above diagnosis and treatment options were reviewed with the patient. Educated the patient about the nature of the lesion(s) seen on today's exam The following recommendations were made: Start stelazine 1 mg at night - call Dr. JINNY in 2 weeks to let him know if you are tolerating, may want you to increase to 2 pills nightly For rash - can apply silvadine cream twice a day to heal the sores For itching/tingling - CeraVe anti-itch lotion with pramoxine as needed The key side effects of any prescribed medications were reviewed with the patient. Written instructions were provided if deemed necessary.  Encouraged to send a message on the MyChart portal with any questions/concerns.  Return in about 3 months (around 11/14/2023) for rash follow up.  Electronically signed by:  Camie Earnie Brunswick, MD 08/14/2023 3:34 PM

## 2023-08-22 ENCOUNTER — Other Ambulatory Visit: Payer: Self-pay | Admitting: Orthopedic Surgery

## 2023-08-22 DIAGNOSIS — L720 Epidermal cyst: Secondary | ICD-10-CM | POA: Diagnosis not present

## 2023-08-22 DIAGNOSIS — R2232 Localized swelling, mass and lump, left upper limb: Secondary | ICD-10-CM | POA: Diagnosis not present

## 2023-08-22 DIAGNOSIS — Z4789 Encounter for other orthopedic aftercare: Secondary | ICD-10-CM | POA: Diagnosis not present

## 2023-08-27 DIAGNOSIS — R202 Paresthesia of skin: Secondary | ICD-10-CM | POA: Diagnosis not present

## 2023-08-27 DIAGNOSIS — R200 Anesthesia of skin: Secondary | ICD-10-CM | POA: Diagnosis not present

## 2023-08-27 DIAGNOSIS — G500 Trigeminal neuralgia: Secondary | ICD-10-CM | POA: Diagnosis not present

## 2023-08-28 LAB — SURGICAL PATHOLOGY

## 2023-08-29 DIAGNOSIS — G4700 Insomnia, unspecified: Secondary | ICD-10-CM | POA: Diagnosis not present

## 2023-08-29 DIAGNOSIS — B379 Candidiasis, unspecified: Secondary | ICD-10-CM | POA: Diagnosis not present

## 2023-08-29 DIAGNOSIS — B370 Candidal stomatitis: Secondary | ICD-10-CM | POA: Diagnosis not present

## 2023-08-29 DIAGNOSIS — M810 Age-related osteoporosis without current pathological fracture: Secondary | ICD-10-CM | POA: Diagnosis not present

## 2023-08-29 DIAGNOSIS — G500 Trigeminal neuralgia: Secondary | ICD-10-CM | POA: Diagnosis not present

## 2023-08-29 DIAGNOSIS — K219 Gastro-esophageal reflux disease without esophagitis: Secondary | ICD-10-CM | POA: Diagnosis not present

## 2023-09-07 DIAGNOSIS — M1612 Unilateral primary osteoarthritis, left hip: Secondary | ICD-10-CM | POA: Diagnosis not present

## 2023-09-07 DIAGNOSIS — M25551 Pain in right hip: Secondary | ICD-10-CM | POA: Diagnosis not present

## 2023-09-07 DIAGNOSIS — Z96641 Presence of right artificial hip joint: Secondary | ICD-10-CM | POA: Diagnosis not present

## 2023-09-13 DIAGNOSIS — M1612 Unilateral primary osteoarthritis, left hip: Secondary | ICD-10-CM | POA: Diagnosis not present

## 2023-09-13 DIAGNOSIS — M170 Bilateral primary osteoarthritis of knee: Secondary | ICD-10-CM | POA: Diagnosis not present

## 2023-10-05 DIAGNOSIS — Z1231 Encounter for screening mammogram for malignant neoplasm of breast: Secondary | ICD-10-CM | POA: Diagnosis not present

## 2023-10-05 DIAGNOSIS — M810 Age-related osteoporosis without current pathological fracture: Secondary | ICD-10-CM | POA: Diagnosis not present

## 2023-10-16 DIAGNOSIS — M5451 Vertebrogenic low back pain: Secondary | ICD-10-CM | POA: Diagnosis not present

## 2023-10-16 DIAGNOSIS — M542 Cervicalgia: Secondary | ICD-10-CM | POA: Diagnosis not present

## 2023-10-22 ENCOUNTER — Ambulatory Visit: Admitting: Dermatology

## 2023-10-23 DIAGNOSIS — R350 Frequency of micturition: Secondary | ICD-10-CM | POA: Diagnosis not present

## 2023-10-23 DIAGNOSIS — M810 Age-related osteoporosis without current pathological fracture: Secondary | ICD-10-CM | POA: Diagnosis not present

## 2023-10-23 DIAGNOSIS — G500 Trigeminal neuralgia: Secondary | ICD-10-CM | POA: Diagnosis not present

## 2023-10-23 DIAGNOSIS — F339 Major depressive disorder, recurrent, unspecified: Secondary | ICD-10-CM | POA: Diagnosis not present

## 2023-10-23 DIAGNOSIS — N8110 Cystocele, unspecified: Secondary | ICD-10-CM | POA: Diagnosis not present

## 2023-11-05 DIAGNOSIS — K921 Melena: Secondary | ICD-10-CM | POA: Diagnosis not present

## 2023-11-05 DIAGNOSIS — K5900 Constipation, unspecified: Secondary | ICD-10-CM | POA: Diagnosis not present

## 2023-11-19 ENCOUNTER — Emergency Department (HOSPITAL_COMMUNITY)

## 2023-11-19 ENCOUNTER — Encounter (HOSPITAL_COMMUNITY): Payer: Self-pay

## 2023-11-19 ENCOUNTER — Emergency Department (HOSPITAL_COMMUNITY): Admission: EM | Admit: 2023-11-19 | Discharge: 2023-11-20 | Disposition: A | Discharge status: Home / Self Care

## 2023-11-19 ENCOUNTER — Other Ambulatory Visit: Payer: Self-pay

## 2023-11-19 DIAGNOSIS — K449 Diaphragmatic hernia without obstruction or gangrene: Secondary | ICD-10-CM | POA: Diagnosis not present

## 2023-11-19 DIAGNOSIS — R079 Chest pain, unspecified: Secondary | ICD-10-CM | POA: Insufficient documentation

## 2023-11-19 DIAGNOSIS — R1032 Left lower quadrant pain: Secondary | ICD-10-CM | POA: Diagnosis not present

## 2023-11-19 DIAGNOSIS — R6883 Chills (without fever): Secondary | ICD-10-CM | POA: Insufficient documentation

## 2023-11-19 DIAGNOSIS — R0602 Shortness of breath: Secondary | ICD-10-CM | POA: Insufficient documentation

## 2023-11-19 DIAGNOSIS — R918 Other nonspecific abnormal finding of lung field: Secondary | ICD-10-CM | POA: Diagnosis not present

## 2023-11-19 DIAGNOSIS — R1084 Generalized abdominal pain: Secondary | ICD-10-CM | POA: Diagnosis present

## 2023-11-19 DIAGNOSIS — R071 Chest pain on breathing: Secondary | ICD-10-CM

## 2023-11-19 DIAGNOSIS — N281 Cyst of kidney, acquired: Secondary | ICD-10-CM | POA: Diagnosis not present

## 2023-11-19 DIAGNOSIS — R0789 Other chest pain: Secondary | ICD-10-CM | POA: Diagnosis not present

## 2023-11-19 DIAGNOSIS — R911 Solitary pulmonary nodule: Secondary | ICD-10-CM

## 2023-11-19 DIAGNOSIS — N3000 Acute cystitis without hematuria: Secondary | ICD-10-CM

## 2023-11-19 DIAGNOSIS — Z7901 Long term (current) use of anticoagulants: Secondary | ICD-10-CM | POA: Diagnosis not present

## 2023-11-19 DIAGNOSIS — K5730 Diverticulosis of large intestine without perforation or abscess without bleeding: Secondary | ICD-10-CM | POA: Diagnosis not present

## 2023-11-19 DIAGNOSIS — J9859 Other diseases of mediastinum, not elsewhere classified: Secondary | ICD-10-CM | POA: Diagnosis not present

## 2023-11-19 LAB — BASIC METABOLIC PANEL WITH GFR
Anion gap: 9 (ref 5–15)
BUN: 17 mg/dL (ref 8–23)
CO2: 25 mmol/L (ref 22–32)
Calcium: 10.2 mg/dL (ref 8.9–10.3)
Chloride: 104 mmol/L (ref 98–111)
Creatinine, Ser: 0.88 mg/dL (ref 0.44–1.00)
GFR, Estimated: >60 mL/min (ref >60)
Glucose, Bld: 100 mg/dL — ABNORMAL HIGH (ref 70–99)
Potassium: 4.7 mmol/L (ref 3.5–5.1)
Sodium: 139 mmol/L (ref 135–145)

## 2023-11-19 LAB — CBC
HCT: 32.3 % — ABNORMAL LOW (ref 36.0–46.0)
Hemoglobin: 9.8 g/dL — ABNORMAL LOW (ref 12.0–15.0)
MCH: 28.8 pg (ref 26.0–34.0)
MCHC: 30.3 g/dL (ref 30.0–36.0)
MCV: 95 fL (ref 80.0–100.0)
Platelets: 220 K/uL (ref 150–400)
RBC: 3.4 MIL/uL — ABNORMAL LOW (ref 3.87–5.11)
RDW: 15.4 % (ref 11.5–15.5)
WBC: 7.6 K/uL (ref 4.0–10.5)
nRBC: 0 % (ref 0.0–0.2)

## 2023-11-19 LAB — URINALYSIS, ROUTINE W REFLEX MICROSCOPIC
Bilirubin Urine: NEGATIVE
Glucose, UA: NEGATIVE mg/dL
Hgb urine dipstick: NEGATIVE
Ketones, ur: NEGATIVE mg/dL
Nitrite: NEGATIVE
Protein, ur: NEGATIVE mg/dL
Specific Gravity, Urine: 1.01 (ref 1.005–1.030)
WBC, UA: >50 WBC/hpf (ref 0–5)
pH: 5 (ref 5.0–8.0)

## 2023-11-19 LAB — HEPATIC FUNCTION PANEL
ALT: 27 U/L (ref 0–44)
AST: 43 U/L — ABNORMAL HIGH (ref 15–41)
Albumin: 3.8 g/dL (ref 3.5–5.0)
Alkaline Phosphatase: 36 U/L — ABNORMAL LOW (ref 38–126)
Bilirubin, Direct: 0.2 mg/dL (ref 0.0–0.2)
Indirect Bilirubin: 0.1 mg/dL — ABNORMAL LOW (ref 0.3–0.9)
Total Bilirubin: 0.3 mg/dL (ref 0.0–1.2)
Total Protein: 7.3 g/dL (ref 6.5–8.1)

## 2023-11-19 LAB — PRO BRAIN NATRIURETIC PEPTIDE: Pro Brain Natriuretic Peptide: 152 pg/mL (ref <300.0)

## 2023-11-19 LAB — RESP PANEL BY RT-PCR (RSV, FLU A&B, COVID)  RVPGX2
Influenza A by PCR: NEGATIVE
Influenza B by PCR: NEGATIVE
Resp Syncytial Virus by PCR: NEGATIVE
SARS Coronavirus 2 by RT PCR: NEGATIVE

## 2023-11-19 LAB — TROPONIN T, HIGH SENSITIVITY
Troponin T High Sensitivity: <15 ng/L (ref 0–19)
Troponin T High Sensitivity: <15 ng/L (ref 0–19)

## 2023-11-19 MED ORDER — IOHEXOL 350 MG/ML SOLN
100.0000 mL | Freq: Once | INTRAVENOUS | Status: AC | PRN
  Administered 2023-11-19: 100 mL via INTRAVENOUS

## 2023-11-19 NOTE — Telephone Encounter (Signed)
 Call was transferred to me and there was nobody on the phone.  I called pt back and spoke with her. She has had shortness of breath for the last few days. She thinks it is from the pregabalin. She has been on it for a while. She hasn't contacted her PCP. I advised her to contact her PCP and/or go to the ER. Pt understands.  She wants to know if she can stop her pregabalin and if so, can she stop it without weaning off of it. She really wants to quit taking it. Please advise.

## 2023-11-19 NOTE — Telephone Encounter (Signed)
 Patient calling to report  New onset SOB Thinks it is the Lyrica (she has been on for while-55yr) Wants to know if she can just stop taking it.   Current Weight 159 Denies ankle swelling Has not been able to sleep the last 2 nights- did sleep this a.m. for about an hour. States she was up for 48 hrs with no sleep prior.  Reports SOB all the time. Lives alone   Nov 19 scheduled for hip replacement. Has preop appt tomorrow.   Patient transferred to [facility]

## 2023-11-19 NOTE — ED Provider Notes (Signed)
 [place] EMERGENCY DEPARTMENT AT [facility] Provider Note   CSN: [id number] Arrival date & time: 11/19/23  8061     Patient presents with: Shortness of Breath   Sherry Levine is a 78 y.o. female.    Shortness of Breath   Presents because of fatigue, chills, shortness of breath, chest pain.  Patient states that this has been present over the past couple of days.  Endorses a pleuritic aspect of chest pain.  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  Endorses a pleuritic aspect of it.  Endorses a runny nose.  Slightly worse than baseline.  No cough.  Endorses chills.  Endorses shortness of breath especially when lying down.  No obvious component of exertional chest pain.  Denies any ACS history.  Does not follow-up with cardiology.  Patient states that she endorses nausea and some generalized lower abdominal pain.  Bowel movements been mostly normal.  Previous medical history reviewed : Patient last admitted in January 2025.  GI bleed.  Cameron's erosions.  On Xarelto  in setting of history of PE.      Prior to Admission medications   Medication Sig Start Date End Date Taking? Authorizing Provider  acetaminophen  (TYLENOL ) 325 MG tablet Take 2 tablets (650 mg total) by mouth every 6 (six) hours as needed for mild pain or fever (or Fever >/= 101). 09/24/22   Emokpae, Courage, MD  apixaban (ELIQUIS) 2.5 MG TABS tablet Take 2.5 mg by mouth 2 (two) times daily. 02/15/23   [provider]  Azelastine HCl 137 MCG/SPRAY SOLN Place 2 sprays into both nostrils 2 (two) times daily. 12/26/22   [provider]  cetirizine  (ZYRTEC ) 10 MG tablet TAKE 1 TABLET BY MOUTH ONCE DAILY 04/16/17   Harl Jayson CROME, MD  cyanocobalamin  (VITAMIN B12) 1000 MCG/ML injection Inject 1 mL (1,000 mcg total) into the muscle every 30 (thirty) days. 11/21/22   Severa Rock HERO, FNP  DULoxetine  (CYMBALTA ) 60 MG capsule Take 1 capsule (60 mg total) by mouth daily.  09/24/22   Pearlean Manus, MD  fluticasone  (FLONASE ) 50 MCG/ACT nasal spray INSTILL 2 SPRAYS IN EACH NOSTRIL EVERY DAY 12/26/22   [provider]  levothyroxine  (SYNTHROID ) 50 MCG tablet Take 1 tablet (50 mcg total) by mouth daily before breakfast. 09/24/22   Pearlean Manus, MD  linaclotide  (LINZESS ) 145 MCG CAPS capsule Take 1 capsule (145 mcg total) by mouth daily before breakfast. 02/02/23   Milian, Marry Lenis, FNP  omeprazole  (PRILOSEC) 40 MG capsule     [provider]  ondansetron  (ZOFRAN -ODT) 4 MG disintegrating tablet Take 4 mg by mouth every 8 (eight) hours as needed for vomiting or nausea. 09/08/20   [provider]  pantoprazole  (PROTONIX ) 40 MG tablet TAKE 1 TABLET BY MOUTH ONCE DAILY 01/30/17   Harl Jayson CROME, MD  polyethylene glycol (MIRALAX  / GLYCOLAX ) 17 g packet Take 17 g by mouth daily. 09/24/22   Pearlean Manus, MD  pregabalin (LYRICA) 50 MG capsule Take 1 capsule by mouth 2 (two) times daily.    [provider]  Psyllium (SM FIBER) 43 % POWD take 1 SCOOP BY MOUTH ONCE DAILY AS DIRECTED 09/24/22   [provider]  rosuvastatin  (CRESTOR ) 10 MG tablet Take 10 mg by mouth at bedtime.    [provider]    Allergies: Celebrex [celecoxib], Bee pollen, Pollen extract, Trazodone , Colchicine, Hydrocodone -acetaminophen , Lyrica [pregabalin], Omnicef [cefdinir], Penicillins, Zithromax [azithromycin], Codeine, Oxycodone , and Prednisone  Review of Systems  Respiratory:  Positive for shortness of breath.     Updated Vital Signs BP (!) 123/56   Pulse 77   Temp 99.3 F (37.4 C) (Oral)   Resp 12   Ht 5' 3 (1.6 m)   Wt 70.8 kg   LMP 04/05/1982   SpO2 98%   BMI 27.65 kg/m   Physical Exam Vitals and nursing note reviewed.  Constitutional:      General: She is not in acute distress.    Appearance: She is well-developed.  HENT:     Head: Normocephalic and atraumatic.  Eyes:     Conjunctiva/sclera: Conjunctivae normal.   Cardiovascular:     Rate and Rhythm: Normal rate and regular rhythm.     Heart sounds: No murmur heard. Pulmonary:     Effort: Pulmonary effort is normal. No respiratory distress.     Breath sounds: Normal breath sounds.  Abdominal:     Palpations: Abdomen is soft.     Tenderness: There is no abdominal tenderness.  Musculoskeletal:        General: No swelling.     Cervical back: Neck supple.  Skin:    General: Skin is warm and dry.     Capillary Refill: Capillary refill takes less than 2 seconds.  Neurological:     Mental Status: She is alert.  Psychiatric:        Mood and Affect: Mood normal.     (all labs ordered are listed, but only abnormal results are displayed) Labs Reviewed  BASIC METABOLIC PANEL WITH GFR - Abnormal; Notable for the following components:      Result Value   Glucose, Bld 100 (*)    All other components within normal limits  CBC - Abnormal; Notable for the following components:   RBC 3.40 (*)    Hemoglobin 9.8 (*)    HCT 32.3 (*)    All other components within normal limits  HEPATIC FUNCTION PANEL - Abnormal; Notable for the following components:   AST 43 (*)    Alkaline Phosphatase 36 (*)    Indirect Bilirubin 0.1 (*)    All other components within normal limits  RESP PANEL BY RT-PCR (RSV, FLU A&B, COVID)  RVPGX2  PRO BRAIN NATRIURETIC PEPTIDE  URINALYSIS, ROUTINE W REFLEX MICROSCOPIC  TROPONIN T, HIGH SENSITIVITY  TROPONIN T, HIGH SENSITIVITY    EKG: EKG Interpretation Date/Time:  Monday November 19 2023 19:56:22 EST Ventricular Rate:  70 PR Interval:  182 QRS Duration:  72 QT Interval:  374 QTC Calculation: 403 R Axis:   -34  Text Interpretation: Normal sinus rhythm with sinus arrhythmia Left axis deviation Cannot rule out Anterior infarct , age undetermined Abnormal ECG When compared with ECG of 14-Feb-2016 21:33, PREVIOUS ECG IS PRESENT Confirmed by Simon Rea [id number]) on 11/19/2023 8:51:06 PM  Radiology: [facility] Chest 2 View Result  Date: 11/19/2023 EXAM: 2 VIEW(S) XRAY OF THE CHEST 11/19/2023 08:15:00 PM COMPARISON: 09/22/2022 CLINICAL HISTORY: sob and cp FINDINGS: LUNGS AND PLEURA: No focal pulmonary opacity. No pulmonary edema. No pleural effusion. No pneumothorax. HEART AND MEDIASTINUM: Large hiatal hernia in the left lower chest. No acute abnormality of the cardiac silhouette. BONES AND SOFT TISSUES: No acute osseous abnormality. IMPRESSION: 1. No acute cardiopulmonary process. 2. Large hiatal hernia in the left lower chest. Electronically signed by: Franky Crease MD 11/19/2023 08:36 PM EST RP Workstation: [id number]     Procedures   Medications Ordered in the ED - No data to display  Medical Decision Making Amount and/or Complexity of Data Reviewed Labs: ordered. Radiology: ordered.     HPI:    Presents because of fatigue, chills, shortness of breath, chest pain.  Patient states that this has been present over the past couple of days.  Endorses a pleuritic aspect of chest pain.  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  Endorses a pleuritic aspect of it.  Endorses a runny nose.  Slightly worse than baseline.  No cough.  Endorses chills.  Endorses shortness of breath especially when lying down.  No obvious component of exertional chest pain.  Denies any ACS history.  Does not follow-up with cardiology.  Patient states that she endorses nausea and some generalized lower abdominal pain.  Bowel movements been mostly normal.  Previous medical history reviewed : Patient last admitted in January 2025.  GI bleed.  Cameron's erosions.  On Xarelto  in setting of history of PE.  MDM:    Upon exam, patient Impeklo stable.  A and O x 3 GCS 15.  O2 saturation 98% on room air.  No tachycardia or tachypnea.  Lung sounds clear to oscillation bilaterally.  Obtain EKG as well as troponin to rule out any kind ACS process.  History of PE on anticoagulation  that sometimes he misses as well as the chest pain and shortness of breath, will obtain CTA of the chest rule out PE.  Obtain CT abdomen pelvis given some pain to palpation left lower quadrant with some guarding.  Rule out diverticulitis and/or complication with this.  Could be a viral process.  Obtaining COVID RSV and flu as well.  Reevaluation:   Upon reexamination, patient hemodynamically stable.  Remains A&O x 3 with GCS 15.  Troponin x 2 negative.  No STEMI on EKG.  Normal sinus rhythm on cardiac telemetry.  Patient will be signed out pending CTA of the chest as well as CT abdomen pelvis.  Also pending COVID RSV and flu panel.    EKG Interpreted by Me: nsr   Cardiac Tele Interpreted by Me: NSR   I have independently interpreted the CXR  images and agree with the radiologist finding     Disposition and Follow Up: pcp       Final diagnoses:  Shortness of breath  Chest pain on breathing  Left lower quadrant abdominal pain    ED Discharge Orders          Ordered    Ambulatory referral to Cardiology       Comments: If you have not heard from the Cardiology office within the next 72 hours please call [phone].   11/19/23 2258               Simon Lavonia SAILOR, MD 11/19/23 2258

## 2023-11-19 NOTE — ED Triage Notes (Signed)
 Pt to ED with c/o sob and chest pressure that started a few days ago, pt is on blood thinner for hx of PE, pt reports compliance, says sob started then chest pressure through to back.

## 2023-11-19 NOTE — Telephone Encounter (Signed)
 Pt notified and understands

## 2023-11-20 MED ORDER — NITROFURANTOIN MONOHYD MACRO 100 MG PO CAPS: 100.0000 mg | ORAL_CAPSULE | Freq: Two times a day (BID) | ORAL | 0 refills | Status: DC

## 2023-11-20 MED ORDER — HYDROXYZINE HCL 25 MG PO TABS: 25.0000 mg | ORAL_TABLET | Freq: Three times a day (TID) | ORAL | 0 refills | Status: DC | PRN

## 2023-11-20 MED ORDER — NITROFURANTOIN MONOHYD MACRO 100 MG PO CAPS
100.0000 mg | ORAL_CAPSULE | Freq: Two times a day (BID) | ORAL | Status: DC
  Administered 2023-11-20: 100 mg via ORAL
  Filled 2023-11-20: qty 1

## 2023-11-20 NOTE — ED Provider Notes (Signed)
 Signed out to me to follow-up on imaging.  Patient underwent CT angiography of chest and CT abdomen and pelvis.  Patient with small lingular pulmonary nodule, no other findings in the chest.  No acute findings in the abdomen and pelvis.  Patient's cardiac evaluation has been negative.  Normal troponins, EKG.  No sign of congestive heart failure.  Patient reports insomnia and feeling anxious.  She has tried trazodone  and Xanax  in the past.  She has also tried Benadryl .  I counseled her that she could try 2 of her Xanax  tablets if she wants but also would provide her with a prescription for Atarax to see how that helps with her anxiety and sleep.  Reviewing her labs, she does have evidence of urinary tract infection which will be treated.  Follow-up with primary care.   Haze Lonni PARAS, MD 11/20/23 [phone]

## 2023-11-20 NOTE — Discharge Instructions (Addendum)
 There is a small nodule which is likely scar tissue in your left lung.  Your doctor needs to get another picture of this and 6 months or so to make sure that it does not change over time.

## 2023-11-20 NOTE — ED Notes (Signed)
 Pt given water per request

## 2023-11-21 NOTE — Progress Notes (Signed)
 Anesthesia Review:  PCP: Cardiologist :  PPM/ ICD: Device Orders: Rep Notified:  Chest x-ray : 11/19/23- 2 view  CT Angion chest- 11/20/23  EKG : 11/19/23  Echo : Stress test: Cardiac Cath :   Activity level:  Sleep Study/ CPAP : Fasting Blood Sugar :      / Checks Blood Sugar -- times a day:    Blood Thinner/ Instructions /Last Dose: ASA / Instructions/ Last Dose :    11/19/23- In ED -SOB  11/19/23- labs done of ua, trop, resp panel , bnp,. Cbc and bmp

## 2023-11-22 NOTE — Patient Instructions (Addendum)
 SURGICAL WAITING ROOM VISITATION  Patients having surgery or a procedure may have no more than 2 support people in the waiting area - these visitors may rotate.    Children under the age of 61 must have an adult with them who is not the patient.  Visitors with respiratory illnesses are discouraged from visiting and should remain at home.  If the patient needs to stay at the hospital during part of their recovery, the visitor guidelines for inpatient rooms apply. Pre-op nurse will coordinate an appropriate time for 1 support person to accompany patient in pre-op.  This support person may not rotate.    Please refer to the [facility] website for the visitor guidelines for Inpatients (after your surgery is over and you are in a regular room).       Your procedure is scheduled on:  12/05/2023    Report to [facility] Main Entrance    Report to admitting at  0600 AM   Call this number if you have problems the morning of surgery [phone]   Do not eat food :After Midnight.   After Midnight you may have the following liquids until __ 0530____ AM  DAY OF SURGERY  Water Non-Citrus Juices (without pulp, NO RED-Apple, White grape, White cranberry) Black Coffee (NO MILK/CREAM OR CREAMERS, sugar ok)  Clear Tea (NO MILK/CREAM OR CREAMERS, sugar ok) regular and decaf                             Plain Jell-O (NO RED)                                           Fruit ices (not with fruit pulp, NO RED)                                     Popsicles (NO RED)                                                               Sports drinks like Gatorade (NO RED)                     The day of surgery:  Drink ONE (1) Pre-Surgery Clear Ensure or G2 at 0530 AM the morning of surgery. Drink in one sitting. Do not sip.  This drink was given to you during your hospital  pre-op appointment visit. Nothing else to drink after completing the  Pre-Surgery Clear Ensure or G2.          If you have  questions, please contact your surgeon's office.       Oral Hygiene is also important to reduce your risk of infection.                                    Remember - BRUSH YOUR TEETH THE MORNING OF SURGERY WITH YOUR REGULAR TOOTHPASTE  DENTURES WILL BE REMOVED PRIOR TO SURGERY PLEASE DO NOT APPLY Poly grip  OR ADHESIVES!!!   Do NOT smoke after Midnight   Stop all vitamins and herbal supplements 7 days before surgery.   Take these medicines the morning of surgery with A SIP OF WATER:  zyrtec , cymbalta , flonase , synthroid , lyrica, protonix    DO NOT TAKE ANY ORAL DIABETIC MEDICATIONS DAY OF YOUR SURGERY  Bring CPAP mask and tubing day of surgery.                              You may not have any metal on your body including hair pins, jewelry, and body piercing             Do not wear make-up, lotions, powders, perfumes/cologne, or deodorant  Do not wear nail polish including gel and S&S, artificial/acrylic nails, or any other type of covering on natural nails including finger and toenails. If you have artificial nails, gel coating, etc. that needs to be removed by a nail salon please have this removed prior to surgery or surgery may need to be canceled/ delayed if the surgeon/ anesthesia feels like they are unable to be safely monitored.   Do not shave  48 hours prior to surgery.               Men may shave face and neck.   Do not bring valuables to the hospital. [facility] IS NOT             RESPONSIBLE   FOR VALUABLES.   Contacts, glasses, dentures or bridgework may not be worn into surgery.   Bring small overnight bag day of surgery.   DO NOT BRING YOUR HOME MEDICATIONS TO THE HOSPITAL. PHARMACY WILL DISPENSE MEDICATIONS LISTED ON YOUR MEDICATION LIST TO YOU DURING YOUR ADMISSION IN THE HOSPITAL!    Patients discharged on the day of surgery will not be allowed to drive home.  Someone NEEDS to stay with you for the first 24 hours after anesthesia.   Special Instructions:  Bring a copy of your healthcare power of attorney and living will documents the day of surgery if you haven't scanned them before.              Please read over the following fact sheets you were given: IF YOU HAVE QUESTIONS ABOUT YOUR PRE-OP INSTRUCTIONS PLEASE CALL [phone].   If you received a COVID test during your pre-op visit  it is requested that you wear a mask when out in public, stay away from anyone that may not be feeling well and notify your surgeon if you develop symptoms. If you test positive for Covid or have been in contact with anyone that has tested positive in the last 10 days please notify you surgeon.      Pre-operative 4 CHG Bath Instructions   You can play a key role in reducing the risk of infection after surgery. Your skin needs to be as free of germs as possible. You can reduce the number of germs on your skin by washing with CHG (chlorhexidine gluconate) soap before surgery. CHG is an antiseptic soap that kills germs and continues to kill germs even after washing.   DO NOT use if you have an allergy to chlorhexidine/CHG or antibacterial soaps. If your skin becomes reddened or irritated, stop using the CHG and notify one of our RNs at [phone].   Please shower with the CHG soap starting 4 days before surgery using the following schedule:  Please keep in mind the following:  DO NOT shave, including legs and underarms, starting the day of your first shower.   You may shave your face at any point before/day of surgery.  Place clean sheets on your bed the day you start using CHG soap. Use a clean washcloth (not used since being washed) for each shower. DO NOT sleep with pets once you start using the CHG.   CHG Shower Instructions:  If you choose to wash your hair and private area, wash first with your normal shampoo/soap.  After you use shampoo/soap, rinse your hair and body thoroughly to remove shampoo/soap residue.  Turn the water OFF and apply about 3  tablespoons (45 ml) of CHG soap to a CLEAN washcloth.  Apply CHG soap ONLY FROM YOUR NECK DOWN TO YOUR TOES (washing for 3-5 minutes)  DO NOT use CHG soap on face, private areas, open wounds, or sores.  Pay special attention to the area where your surgery is being performed.  If you are having back surgery, having someone wash your back for you may be helpful. Wait 2 minutes after CHG soap is applied, then you may rinse off the CHG soap.  Pat dry with a clean towel  Put on clean clothes/pajamas   If you choose to wear lotion, please use ONLY the CHG-compatible lotions on the back of this paper.     Additional instructions for the day of surgery: DO NOT APPLY any lotions, deodorants, cologne, or perfumes.   Put on clean/comfortable clothes.  Brush your teeth.  Ask your nurse before applying any prescription medications to the skin.      CHG Compatible Lotions   Aveeno Moisturizing lotion  Cetaphil Moisturizing Cream  Cetaphil Moisturizing Lotion  Clairol Herbal Essence Moisturizing Lotion, Dry Skin  Clairol Herbal Essence Moisturizing Lotion, Extra Dry Skin  Clairol Herbal Essence Moisturizing Lotion, Normal Skin  Curel Age Defying Therapeutic Moisturizing Lotion with Alpha Hydroxy  Curel Extreme Care Body Lotion  Curel Soothing Hands Moisturizing Hand Lotion  Curel Therapeutic Moisturizing Cream, Fragrance-Free  Curel Therapeutic Moisturizing Lotion, Fragrance-Free  Curel Therapeutic Moisturizing Lotion, Original Formula  Eucerin Daily Replenishing Lotion  Eucerin Dry Skin Therapy Plus Alpha Hydroxy Crme  Eucerin Dry Skin Therapy Plus Alpha Hydroxy Lotion  Eucerin Original Crme  Eucerin Original Lotion  Eucerin Plus Crme Eucerin Plus Lotion  Eucerin TriLipid Replenishing Lotion  Keri Anti-Bacterial Hand Lotion  Keri Deep Conditioning Original Lotion Dry Skin Formula Softly Scented  Keri Deep Conditioning Original Lotion, Fragrance Free Sensitive Skin Formula  Keri  Lotion Fast Absorbing Fragrance Free Sensitive Skin Formula  Keri Lotion Fast Absorbing Softly Scented Dry Skin Formula  Keri Original Lotion  Keri Skin Renewal Lotion Keri Silky Smooth Lotion  Keri Silky Smooth Sensitive Skin Lotion  Nivea Body Creamy Conditioning Oil  Nivea Body Extra Enriched Teacher, Adult Education Moisturizing Lotion Nivea Crme  Nivea Skin Firming Lotion  NutraDerm 30 Skin Lotion  NutraDerm Skin Lotion  NutraDerm Therapeutic Skin Cream  NutraDerm Therapeutic Skin Lotion  ProShield Protective Hand Cream  Provon moisturizing lotion

## 2023-11-23 DIAGNOSIS — N390 Urinary tract infection, site not specified: Secondary | ICD-10-CM | POA: Diagnosis not present

## 2023-11-27 ENCOUNTER — Encounter (HOSPITAL_COMMUNITY)
Admission: RE | Admit: 2023-11-27 | Discharge: 2023-11-27 | Disposition: A | Discharge status: Home / Self Care | Source: Ambulatory Visit

## 2023-11-27 DIAGNOSIS — D649 Anemia, unspecified: Secondary | ICD-10-CM | POA: Diagnosis not present

## 2023-11-27 DIAGNOSIS — I700 Atherosclerosis of aorta: Secondary | ICD-10-CM | POA: Diagnosis not present

## 2023-11-27 DIAGNOSIS — K449 Diaphragmatic hernia without obstruction or gangrene: Secondary | ICD-10-CM | POA: Diagnosis not present

## 2023-11-27 DIAGNOSIS — R253 Fasciculation: Secondary | ICD-10-CM | POA: Diagnosis not present

## 2023-11-27 DIAGNOSIS — R29898 Other symptoms and signs involving the musculoskeletal system: Secondary | ICD-10-CM | POA: Diagnosis not present

## 2023-11-27 DIAGNOSIS — E039 Hypothyroidism, unspecified: Secondary | ICD-10-CM | POA: Diagnosis not present

## 2023-11-27 DIAGNOSIS — Z79899 Other long term (current) drug therapy: Secondary | ICD-10-CM | POA: Diagnosis not present

## 2023-11-27 DIAGNOSIS — F339 Major depressive disorder, recurrent, unspecified: Secondary | ICD-10-CM | POA: Diagnosis not present

## 2023-11-27 DIAGNOSIS — R0602 Shortness of breath: Secondary | ICD-10-CM | POA: Diagnosis not present

## 2023-11-27 DIAGNOSIS — R002 Palpitations: Secondary | ICD-10-CM | POA: Diagnosis not present

## 2023-11-29 ENCOUNTER — Encounter (HOSPITAL_COMMUNITY): Payer: Self-pay | Admitting: Emergency Medicine

## 2023-11-29 ENCOUNTER — Other Ambulatory Visit: Payer: Self-pay

## 2023-11-29 ENCOUNTER — Emergency Department (HOSPITAL_COMMUNITY)
Admission: EM | Admit: 2023-11-29 | Discharge: 2023-11-29 | Disposition: A | Discharge status: Home / Self Care | Attending: Emergency Medicine | Admitting: Emergency Medicine

## 2023-11-29 ENCOUNTER — Emergency Department (HOSPITAL_COMMUNITY)

## 2023-11-29 DIAGNOSIS — E039 Hypothyroidism, unspecified: Secondary | ICD-10-CM | POA: Diagnosis not present

## 2023-11-29 DIAGNOSIS — K449 Diaphragmatic hernia without obstruction or gangrene: Secondary | ICD-10-CM | POA: Insufficient documentation

## 2023-11-29 DIAGNOSIS — Z79899 Other long term (current) drug therapy: Secondary | ICD-10-CM | POA: Insufficient documentation

## 2023-11-29 DIAGNOSIS — R0602 Shortness of breath: Secondary | ICD-10-CM | POA: Diagnosis not present

## 2023-11-29 DIAGNOSIS — I10 Essential (primary) hypertension: Secondary | ICD-10-CM | POA: Insufficient documentation

## 2023-11-29 DIAGNOSIS — I700 Atherosclerosis of aorta: Secondary | ICD-10-CM | POA: Diagnosis not present

## 2023-11-29 DIAGNOSIS — R079 Chest pain, unspecified: Secondary | ICD-10-CM | POA: Diagnosis not present

## 2023-11-29 LAB — CBC
HCT: 34 % — ABNORMAL LOW (ref 36.0–46.0)
Hemoglobin: 10.1 g/dL — ABNORMAL LOW (ref 12.0–15.0)
MCH: 27.8 pg (ref 26.0–34.0)
MCHC: 29.7 g/dL — ABNORMAL LOW (ref 30.0–36.0)
MCV: 93.7 fL (ref 80.0–100.0)
Platelets: 222 K/uL (ref 150–400)
RBC: 3.63 MIL/uL — ABNORMAL LOW (ref 3.87–5.11)
RDW: 15.6 % — ABNORMAL HIGH (ref 11.5–15.5)
WBC: 5.8 K/uL (ref 4.0–10.5)
nRBC: 0 % (ref 0.0–0.2)

## 2023-11-29 LAB — BASIC METABOLIC PANEL WITH GFR
Anion gap: 10 (ref 5–15)
BUN: 18 mg/dL (ref 8–23)
CO2: 22 mmol/L (ref 22–32)
Calcium: 9.9 mg/dL (ref 8.9–10.3)
Chloride: 106 mmol/L (ref 98–111)
Creatinine, Ser: 1.05 mg/dL — ABNORMAL HIGH (ref 0.44–1.00)
GFR, Estimated: 54 mL/min — ABNORMAL LOW (ref >60)
Glucose, Bld: 95 mg/dL (ref 70–99)
Potassium: 4.6 mmol/L (ref 3.5–5.1)
Sodium: 138 mmol/L (ref 135–145)

## 2023-11-29 LAB — TROPONIN T, HIGH SENSITIVITY
Troponin T High Sensitivity: 30 ng/L — ABNORMAL HIGH (ref 0–19)
Troponin T High Sensitivity: 17 ng/L (ref 0–19)

## 2023-11-29 MED ORDER — ACETAMINOPHEN 325 MG PO TABS
650.0000 mg | ORAL_TABLET | Freq: Once | ORAL | Status: AC
  Administered 2023-11-29: 650 mg via ORAL
  Filled 2023-11-29: qty 2

## 2023-11-29 MED ORDER — ALBUTEROL SULFATE (2.5 MG/3ML) 0.083% IN NEBU: 5.0000 mg | INHALATION_SOLUTION | Freq: Once | RESPIRATORY_TRACT | Status: DC

## 2023-11-29 MED ORDER — PEG 3350-KCL-NA BICARB-NACL 420 G PO SOLR: 4000.0000 mL | Freq: Once | ORAL | 0 refills | Status: AC

## 2023-11-29 MED ORDER — ALBUTEROL SULFATE HFA 108 (90 BASE) MCG/ACT IN AERS
2.0000 | INHALATION_SPRAY | Freq: Once | RESPIRATORY_TRACT | Status: AC
  Administered 2023-11-29: 2 via RESPIRATORY_TRACT
  Filled 2023-11-29: qty 13.4

## 2023-11-29 NOTE — ED Triage Notes (Addendum)
 Patient c/o Chest pain and SOB x 1 week. Patient report chest pain radiates to left shoulder and back. Patient report she was seen at [facility] with same complain 10 days ago. Patient report nausea , denies vomiting. Patient report taking blood thinners.

## 2023-11-29 NOTE — ED Provider Notes (Signed)
 [facility] AT [facility] Provider Note   CSN: [id number] Arrival date & time: 11/29/23  1516     Patient presents with: Shortness of Breath and Chest Pain   Sherry Levine is a 78 y.o. female history of trigeminal neuralgia, hypertension, hypothyroidism here presenting with shortness of breath and chest pain.  Patient was seen at [facility] several days ago.  She had CT scan that showed large hiatal hernia and no PE.  Patient states that she had some worsening shortness of breath and called doctor's office and was sent in for further evaluation.  Patient apparently had an abnormal EKG in the office.  Patient denies any history of CAD or stents.   The history is provided by the patient.       Prior to Admission medications   Medication Sig Start Date End Date Taking? Authorizing Provider  acetaminophen  (TYLENOL ) 500 MG tablet Take 500-1,000 mg by mouth every 6 (six) hours as needed.    [provider]  apixaban (ELIQUIS) 2.5 MG TABS tablet Take 2.5 mg by mouth 2 (two) times daily. 02/15/23   [provider]  Azelastine HCl 137 MCG/SPRAY SOLN Place 2 sprays into both nostrils 2 (two) times daily as needed (allergies.). 12/26/22   [provider]  DULoxetine  (CYMBALTA ) 60 MG capsule Take 1 capsule (60 mg total) by mouth daily. 09/24/22   Pearlean Manus, MD  levocetirizine (XYZAL) 5 MG tablet Take 5 mg by mouth daily as needed for allergies.    [provider]  levothyroxine  (SYNTHROID ) 50 MCG tablet Take 1 tablet (50 mcg total) by mouth daily before breakfast. 09/24/22   Pearlean Manus, MD  LORazepam  (ATIVAN ) 0.5 MG tablet Take 0.5 mg by mouth at bedtime.    [provider]  Olopatadine HCl (PATADAY OP) Place 1-2 drops into both eyes in the morning and at bedtime.    [provider]  omeprazole  (PRILOSEC) 40 MG capsule Take 40 mg by mouth daily before breakfast.    [provider]  ondansetron   (ZOFRAN -ODT) 4 MG disintegrating tablet Take 4 mg by mouth every 8 (eight) hours as needed for vomiting or nausea. 09/08/20   [provider]  rosuvastatin  (CRESTOR ) 10 MG tablet Take 10 mg by mouth at bedtime.    [provider]  senna-docusate (COLACE 2-IN-1) 8.6-50 MG tablet Take 6 tablets by mouth at bedtime.    [provider]  sulfamethoxazole -trimethoprim  (BACTRIM  DS) 800-160 MG tablet Take 1 tablet by mouth 2 (two) times daily. 11/23/23   [provider]    Allergies: Celebrex [celecoxib], Bee pollen, Pollen extract, Trazodone , Colchicine, Hydrocodone -acetaminophen , Lyrica [pregabalin], Omnicef [cefdinir], Penicillins, Zithromax [azithromycin], Codeine, Oxycodone , and Prednisone    Review of Systems  Respiratory:  Positive for shortness of breath.   Cardiovascular:  Positive for chest pain.  All other systems reviewed and are negative.   Updated Vital Signs BP 137/76 (BP Location: Left Arm)   Pulse 72   Temp 97.9 F (36.6 C) (Oral)   Resp 17   LMP 04/05/1982   SpO2 100%   Physical Exam Vitals and nursing note reviewed.  HENT:     Head: Normocephalic.     Mouth/Throat:     Mouth: Mucous membranes are moist.  Eyes:     Extraocular Movements: Extraocular movements intact.     Pupils: Pupils are equal, round, and reactive to light.  Cardiovascular:     Rate and Rhythm: Normal rate and regular rhythm.  Pulmonary:  Comments: Diminished bilaterally and no obvious wheezing Abdominal:     General: Bowel sounds are normal.     Palpations: Abdomen is soft.  Musculoskeletal:        General: Normal range of motion.     Cervical back: Normal range of motion and neck supple.  Skin:    General: Skin is warm.     Capillary Refill: Capillary refill takes less than 2 seconds.  Neurological:     General: No focal deficit present.     Mental Status: She is alert and oriented to person, place, and time.  Psychiatric:        Mood and Affect: Mood  normal.        Behavior: Behavior normal.     (all labs ordered are listed, but only abnormal results are displayed) Labs Reviewed  BASIC METABOLIC PANEL WITH GFR - Abnormal; Notable for the following components:      Result Value   Creatinine, Ser 1.05 (*)    GFR, Estimated 54 (*)    All other components within normal limits  CBC - Abnormal; Notable for the following components:   RBC 3.63 (*)    Hemoglobin 10.1 (*)    HCT 34.0 (*)    MCHC 29.7 (*)    RDW 15.6 (*)    All other components within normal limits  TROPONIN T, HIGH SENSITIVITY - Abnormal; Notable for the following components:   Troponin T High Sensitivity 30 (*)    All other components within normal limits  TROPONIN T, HIGH SENSITIVITY    EKG: EKG Interpretation Date/Time:  Thursday November 29 2023 15:29:09 EST Ventricular Rate:  83 PR Interval:  164 QRS Duration:  81 QT Interval:  348 QTC Calculation: 409 R Axis:   -57  Text Interpretation: Sinus rhythm Left anterior fascicular block Low voltage, precordial leads RSR' in V1 or V2, right VCD or RVH Confirmed by Simon Rea [phone]) on 11/29/2023 3:37:36 PM  Radiology: DG Chest 2 View Result Date: 11/29/2023 CLINICAL DATA:  Chest pain EXAM: CHEST - 2 VIEW COMPARISON:  Chest x-ray 11/19/2023 FINDINGS: There is a large hiatal hernia containing air-fluid level. The lungs are clear. Cardiomediastinal silhouette is within normal limits. There are atherosclerotic calcifications of the aorta. There is no pleural effusion or pneumothorax. Cervical spinal fusion plate is present. IMPRESSION: 1. No active cardiopulmonary disease. 2. Large hiatal hernia. Electronically Signed   By: Greig Pique M.D.   On: 11/29/2023 16:17     Procedures   Medications Ordered in the ED  albuterol (VENTOLIN HFA) 108 (90 Base) MCG/ACT inhaler 2 puff (has no administration in time range)                                    Medical Decision Making Sherry Levine is a 78 y.o. female  who presenting with shortness of breath.  Likely bronchitis or side effect of hiatal hernia.  Patient apparently had an abnormal EKG but I reviewed her EKG today and is unchanged from previous.  I have a low suspicion for ACS or PE.  Will get troponin x 2 and give albuterol and reassess  8:34 PM Initial troponin was 30 and repeat was 17.  Chest x-ray showed large hiatal hernia.  I think her symptoms are likely from her hernia.  I do not think it is cardiac in nature.  I recommend she follow-up with general surgery regarding  her hernia  Problems Addressed: Hiatal hernia: chronic illness or injury Shortness of breath: chronic illness or injury  Amount and/or Complexity of Data Reviewed Labs: ordered. Radiology: ordered.  Risk OTC drugs. Prescription drug management.    Final diagnoses:  None    ED Discharge Orders     None          Patt Alm Macho, MD 11/29/23 2035

## 2023-11-29 NOTE — Discharge Instructions (Addendum)
 You have a large hiatal hernia that is causing your symptoms.  Your heart enzyme is normal today  You are also constipated.  I have prescribed laxatives to help you with constipation  I have also sent you to general surgery for follow-up regarding your hernia  Please call the general surgery office for follow-up  You may use albuterol 2 puffs every 4 hours as needed for shortness of breath  Return to ER if you have worse shortness of breath or chest pain or abdominal pain

## 2023-11-29 NOTE — ED Provider Triage Note (Signed)
 Emergency Medicine Provider Triage Evaluation Note  Sherry Levine , a 78 y.o. female  was evaluated in triage.  Pt complains of 2 weeks of chest pain with shortness of breath.  Patient went to [facility] and was diagnosed with hiatal hernia causing chest pain and shortness of breath.  She followed up with PCP and PCP advised that she had signs of infarct on her EKG.  Patient reports shortness of breath and chest pain is getting worse.  Review of Systems  Positive: Chest pain, shortness of breath Negative: Dizziness, swelling, fever,  Physical Exam  BP 107/65   Pulse 79   Temp 98 F (36.7 C)   Resp 16   LMP 04/05/1982   SpO2 100%  Gen:   Awake, no distress   Resp:  Normal effort lungs are clear to auscultation all fields MSK:   No swelling noted in distal extremities. Other:    Medical Decision Making  Medically screening exam initiated at 4:41 PM.  Appropriate orders placed.  TERAH ROBEY was informed that the remainder of the evaluation will be completed by another provider, this initial triage assessment does not replace that evaluation, and the importance of remaining in the ED until their evaluation is complete.  Patient has a Wells score of 1 and had a CTA performed on November 3 10 days ago.  CTA will not be repeated at this time.   Myriam Fonda RAMAN, [place] 11/29/23 1650

## 2023-12-11 ENCOUNTER — Telehealth: Payer: Self-pay | Admitting: *Deleted

## 2023-12-11 NOTE — Telephone Encounter (Signed)
 Received referral from Jenkins Pizza, FNP with [facility]~ [phone] telephone  Reason for referral: hiatal hernia   Call placed to referring provider and advised that [facility] does not correct hiatal hernia. Advised to refer to [facility] in [place].

## 2024-01-24 ENCOUNTER — Ambulatory Visit: Admitting: Internal Medicine

## 2024-02-11 ENCOUNTER — Ambulatory Visit: Admit: 2024-02-11 | Admitting: Orthopedic Surgery

## 2024-02-19 ENCOUNTER — Ambulatory Visit: Admitting: Obstetrics and Gynecology

## 2024-03-06 ENCOUNTER — Ambulatory Visit: Admitting: Internal Medicine

## 2024-03-28 ENCOUNTER — Ambulatory Visit: Admitting: Obstetrics

## 2024-04-03 ENCOUNTER — Ambulatory Visit: Admitting: Neurology
